# Patient Record
Sex: Male | Born: 1965 | Race: White | Hispanic: No | Marital: Married | Smoking: Never smoker
Health system: Southern US, Community
[De-identification: ages and names within clinical notes are randomized; demographics above are authoritative.]

## PROBLEM LIST (undated history)

## (undated) DIAGNOSIS — K219 Gastro-esophageal reflux disease without esophagitis: Secondary | ICD-10-CM

## (undated) DIAGNOSIS — K449 Diaphragmatic hernia without obstruction or gangrene: Secondary | ICD-10-CM

## (undated) HISTORY — DX: Gastro-esophageal reflux disease without esophagitis: K21.9

---

## 2013-05-23 ENCOUNTER — Emergency Department (HOSPITAL_COMMUNITY): Payer: BC Managed Care – PPO

## 2013-05-23 ENCOUNTER — Emergency Department (HOSPITAL_COMMUNITY)
Admission: EM | Admit: 2013-05-23 | Discharge: 2013-05-24 | Disposition: A | Discharge status: Home / Self Care | Payer: BC Managed Care – PPO | Attending: Emergency Medicine | Admitting: Emergency Medicine

## 2013-05-23 ENCOUNTER — Encounter (HOSPITAL_COMMUNITY): Payer: Self-pay | Admitting: Emergency Medicine

## 2013-05-23 DIAGNOSIS — Z880 Allergy status to penicillin: Secondary | ICD-10-CM | POA: Insufficient documentation

## 2013-05-23 DIAGNOSIS — Z79899 Other long term (current) drug therapy: Secondary | ICD-10-CM | POA: Insufficient documentation

## 2013-05-23 DIAGNOSIS — K219 Gastro-esophageal reflux disease without esophagitis: Secondary | ICD-10-CM | POA: Insufficient documentation

## 2013-05-23 DIAGNOSIS — R072 Precordial pain: Secondary | ICD-10-CM | POA: Insufficient documentation

## 2013-05-23 DIAGNOSIS — R079 Chest pain, unspecified: Secondary | ICD-10-CM

## 2013-05-23 LAB — COMPREHENSIVE METABOLIC PANEL
ALBUMIN: 4 g/dL (ref 3.5–5.2)
ALT: 29 U/L (ref 0–53)
AST: 27 U/L (ref 0–37)
Alkaline Phosphatase: 93 U/L (ref 39–117)
BUN: 27 mg/dL — ABNORMAL HIGH (ref 6–23)
CO2: 23 mEq/L (ref 19–32)
CREATININE: 1.04 mg/dL (ref 0.50–1.35)
Calcium: 9.1 mg/dL (ref 8.4–10.5)
Chloride: 103 mEq/L (ref 96–112)
GFR calc Af Amer: >90 mL/min (ref >90)
GFR calc non Af Amer: 83 mL/min — ABNORMAL LOW (ref >90)
Glucose, Bld: 104 mg/dL — ABNORMAL HIGH (ref 70–99)
POTASSIUM: 3.9 meq/L (ref 3.7–5.3)
Sodium: 140 mEq/L (ref 137–147)
TOTAL PROTEIN: 7.2 g/dL (ref 6.0–8.3)
Total Bilirubin: 0.7 mg/dL (ref 0.3–1.2)

## 2013-05-23 LAB — CBC WITH DIFFERENTIAL/PLATELET
BASOS ABS: 0 10*3/uL (ref 0.0–0.1)
BASOS PCT: 1 % (ref 0–1)
Eosinophils Absolute: 0.1 10*3/uL (ref 0.0–0.7)
Eosinophils Relative: 2 % (ref 0–5)
HCT: 41.3 % (ref 39.0–52.0)
Hemoglobin: 14.6 g/dL (ref 13.0–17.0)
Lymphocytes Relative: 15 % (ref 12–46)
Lymphs Abs: 0.9 10*3/uL (ref 0.7–4.0)
MCH: 29.5 pg (ref 26.0–34.0)
MCHC: 35.4 g/dL (ref 30.0–36.0)
MCV: 83.4 fL (ref 78.0–100.0)
MONO ABS: 0.8 10*3/uL (ref 0.1–1.0)
Monocytes Relative: 13 % — ABNORMAL HIGH (ref 3–12)
NEUTROS ABS: 4 10*3/uL (ref 1.7–7.7)
NEUTROS PCT: 68 % (ref 43–77)
Platelets: 177 10*3/uL (ref 150–400)
RBC: 4.95 MIL/uL (ref 4.22–5.81)
RDW: 13.3 % (ref 11.5–15.5)
WBC: 5.9 10*3/uL (ref 4.0–10.5)

## 2013-05-23 LAB — PRO B NATRIURETIC PEPTIDE

## 2013-05-23 LAB — LIPASE, BLOOD: Lipase: 46 U/L (ref 11–59)

## 2013-05-23 LAB — I-STAT TROPONIN, ED: TROPONIN I, POC: 0.01 ng/mL (ref 0.00–0.08)

## 2013-05-23 MED ORDER — GI COCKTAIL ~~LOC~~
30.0000 mL | Freq: Once | ORAL | Status: AC
  Administered 2013-05-23: 30 mL via ORAL
  Filled 2013-05-23: qty 30

## 2013-05-23 MED ORDER — ONDANSETRON HCL 4 MG/2ML IJ SOLN
4.0000 mg | Freq: Once | INTRAMUSCULAR | Status: AC
  Administered 2013-05-23: 4 mg via INTRAVENOUS
  Filled 2013-05-23: qty 2

## 2013-05-23 NOTE — ED Provider Notes (Signed)
CSN: [id number]     Arrival date & time 05/23/13  2036 History   First MD Initiated Contact with Patient 05/23/13 2050     Chief Complaint  Patient presents with  . Chest Pain     (Consider location/radiation/quality/duration/timing/severity/associated sxs/prior Treatment) HPI Alexander Carroll is a 48 y.o. male who presents to ED with complaint of burping, chest burning, nausea, vomiting, diarrhea. States symptoms began several days ago. States he has increased belching and burping. States every time he burps he has burning sensation and pain to the upper midsternal chest. States also has had nausea and vomiting after eating that started about 2 days ago. Reports watery diarrhea today. Denies blood in emesis or stool. Denies fever or chills. States has seen his PCP, started on pepcid. Has apt again next week to be set up with GI. Pt admits to alcohol use, denies daily use. Denies NSAID use. No abdominal pain. No back pain. Admits to some SOB. No symptoms with exertion. No extremity swelling.   History reviewed. No pertinent past medical history. History reviewed. No pertinent past surgical history. No family history on file. History  Substance Use Topics  . Smoking status: Never Smoker   . Smokeless tobacco: Not on file  . Alcohol Use: Yes     Comment: occ    Review of Systems  Constitutional: Negative for fever and chills.  HENT: Negative for congestion.   Respiratory: Positive for chest tightness and shortness of breath. Negative for cough.   Cardiovascular: Positive for chest pain. Negative for palpitations and leg swelling.  Gastrointestinal: Positive for nausea, vomiting and diarrhea. Negative for abdominal pain and abdominal distention.  Genitourinary: Negative for dysuria, urgency, frequency and hematuria.  Musculoskeletal: Negative for arthralgias, myalgias, neck pain and neck stiffness.  Skin: Negative for rash.  Allergic/Immunologic: Negative for immunocompromised state.   Neurological: Negative for dizziness, weakness, light-headedness, numbness and headaches.  All other systems reviewed and are negative.     Allergies  Penicillins  Home Medications   Prior to Admission medications   Medication Sig Start Date End Date Taking? Authorizing Provider  acetaminophen (TYLENOL) 325 MG tablet Take 650 mg by mouth every 6 (six) hours as needed for mild pain.   Yes Historical Provider, MD  famotidine (PEPCID) 40 MG tablet Take 1 tablet by mouth daily. 04/02/13  Yes Historical Provider, MD  Multiple Vitamin (MULTIVITAMIN WITH MINERALS) TABS tablet Take 1 tablet by mouth daily.   Yes Historical Provider, MD   BP 143/93  Pulse 75  Temp(Src) 98.1 F (36.7 C) (Oral)  Resp 20  Ht 6\' 1"  (1.854 m)  Wt 185 lb (83.915 kg)  BMI 24.41 kg/m2  SpO2 96% Physical Exam  Nursing note and vitals reviewed. Constitutional: He is oriented to person, place, and time. He appears well-developed and well-nourished. No distress.  HENT:  Head: Normocephalic and atraumatic.  Eyes: Conjunctivae are normal.  Neck: Neck supple.  Cardiovascular: Normal rate, regular rhythm and normal heart sounds.   Pulmonary/Chest: Effort normal and breath sounds normal. No respiratory distress. He has no wheezes. He has no rales. He exhibits no tenderness.  Abdominal: Soft. Bowel sounds are normal. He exhibits no distension. There is no tenderness. There is no rebound.  Musculoskeletal: He exhibits no edema.  Neurological: He is alert and oriented to person, place, and time.  Skin: Skin is warm and dry.    ED Course  Procedures (including critical care time) Labs Review Labs Reviewed  CBC WITH DIFFERENTIAL - Abnormal; Notable  for the following:    Monocytes Relative 13 (*)    All other components within normal limits  COMPREHENSIVE METABOLIC PANEL - Abnormal; Notable for the following:    Glucose, Bld 104 (*)    BUN 27 (*)    GFR calc non Af Amer 83 (*)    All other components within  normal limits  LIPASE, BLOOD  PRO B NATRIURETIC PEPTIDE  I-STAT TROPOININ, ED  I-STAT TROPOININ, ED    Imaging Review Dg Chest 2 View  05/23/2013   CLINICAL DATA:  Chest pain  EXAM: CHEST  2 VIEW  COMPARISON:  None.  FINDINGS: Shallow inspiration. Mild cardiac enlargement with mild pulmonary vascular congestion. Interstitial changes could represent early interstitial edema or fibrosis. No focal airspace disease or consolidation in the lungs. No blunting of costophrenic angles. No pneumothorax. Old right rib fractures.  IMPRESSION: Cardiac enlargement with mild pulmonary vascular congestion and early interstitial edema versus fibrosis.   Electronically Signed   By: Burman NievesWilliam  Stevens M.D.   On: 05/23/2013 22:21     EKG Interpretation   Date/Time:  Wednesday May 23 2013 20:46:31 EDT Ventricular Rate:  76 PR Interval:  155 QRS Duration: 101 QT Interval:  397 QTC Calculation: 446 R Axis:   12 Text Interpretation:  Sinus rhythm Abnormal R-wave progression, early  transition No old tracing to compare Confirmed by JACUBOWITZ  MD, SAM  [phone]) on 05/23/2013 8:53:25 PM      MDM   Final diagnoses:  Chest pain  GERD (gastroesophageal reflux disease)    Pt with burping, belching, chest burning, nausea, vomiting diarrhea. Pt's symptoms atypical for cardiac pain. ECG normal. Will get two troponins, labs, cxr. Will try zofran and gi cocktail. Will monitor.    11:59 PM Pt's symptoms improved with zofran and gi cocktail. CXR showed cardiac enlargement with mild pulmonary vascular congestion and early interstitial edema vs fibrosis. Added BNP. Will monitor, recheck trop. Pt discussed and also seen by Dr. Ethelda ChickJacubowitz, agrees with the plan.    Recheck trop negative. BNP negative. Home with prilosec, carafate, continue pepcid. Follow up with pcp.    Filed Vitals:   05/23/13 2048 05/23/13 2200 05/24/13 0009  BP: 143/93 128/84 121/81  Pulse: 75 68 72  Temp: 98.1 F (36.7 C)  98.3 F (36.8 C)   TempSrc: Oral  Oral  Resp: 20 17 18   Height: 6\' 1"  (1.854 m)    Weight: 185 lb (83.915 kg)    SpO2: 96% 95% 94%      Lottie Musselatyana A Mohmmad Saleeby, PA-C 05/24/13 0116

## 2013-05-23 NOTE — ED Notes (Signed)
Pt states that he has ingestion, nausea and vomiting over the last 3-4 days; pt states that he has had midsternal chest pain / pressure x 3-4 days; pt states that the pressure to chest feels better after vomiting; pt also c/o diarrhea x 2 today.

## 2013-05-23 NOTE — ED Provider Notes (Signed)
Patient reports that he vomits approximately 30 minutes after eating for the past 2 weeks and belches loudly. He has anterior chest pain only with belching lasting a few seconds. Pain is Nonexertional.. Pain is highly atypical for cardiac etiology. Likely functional.  Doug SouSam Kristina Bertone, MD 05/23/13 [phone]

## 2013-05-24 LAB — I-STAT TROPONIN, ED: Troponin i, poc: 0 ng/mL (ref 0.00–0.08)

## 2013-05-24 MED ORDER — OMEPRAZOLE 20 MG PO CPDR: 20.0000 mg | DELAYED_RELEASE_CAPSULE | Freq: Every day | ORAL | Status: DC

## 2013-05-24 MED ORDER — SUCRALFATE 1 G PO TABS: 1.0000 g | ORAL_TABLET | Freq: Three times a day (TID) | ORAL | Status: DC

## 2013-05-24 NOTE — Discharge Instructions (Signed)
Your work up here is normal. Continue to take pepcid. Take prilosec in addition for acid relief. Take carafate for symptoms as needed. Follow up with primary car doctor for recheck or with gastroenterology.    Diet for Gastroesophageal Reflux Disease, Adult Reflux is when stomach acid flows up into the esophagus. The esophagus becomes irritated and sore (inflammation). When reflux happens often and is severe, it is called gastroesophageal reflux disease (GERD). What you eat can help ease any discomfort caused by GERD. FOODS OR DRINKS TO AVOID OR LIMIT  Coffee and black tea, with or without caffeine.  Bubbly (carbonated) drinks with caffeine or energy drinks.  Strong spices, such as pepper, cayenne pepper, curry, or chili powder.  Peppermint or spearmint.  Chocolate.  High-fat foods, such as meats, fried food, oils, butter, or nuts.  Fruits and vegetables that cause discomfort. This includes citrus fruits and tomatoes.  Alcohol. If a certain food or drink irritates your GERD, avoid eating or drinking it. THINGS THAT MAY HELP GERD INCLUDE:  Eat meals slowly.  Eat 5 to 6 small meals a day, not 3 large meals.  Do not eat food for a certain amount of time if it causes discomfort.  Wait 3 hours after eating before lying down.  Keep the head of your bed raised 6 to 9 inches (15 23 centimeters). Put a foam wedge or blocks under the legs of the bed.  Stay active. Weight loss, if needed, may help ease your discomfort.  Wear loose-fitting clothing.  Do not smoke or chew tobacco. Document Released: 06/29/2011 Document Reviewed: 06/29/2011 [facility] Patient Information 2014 [employer] [place], [place].

## 2013-05-24 NOTE — ED Provider Notes (Signed)
Medical screening examination/treatment/procedure(s) were performed by non-physician practitioner and as supervising physician I was immediately available for consultation/collaboration.   EKG Interpretation   Date/Time:  Wednesday May 23 2013 20:46:31 EDT Ventricular Rate:  76 PR Interval:  155 QRS Duration: 101 QT Interval:  397 QTC Calculation: 446 R Axis:   12 Text Interpretation:  Sinus rhythm Abnormal R-wave progression, early  transition No old tracing to compare Confirmed by Ethelda ChickJACUBOWITZ  MD, SAM  [phone]) on 05/23/2013 8:53:25 PM        Lyanne CoKevin M Basilia Stuckert, MD 05/24/13 [phone]

## 2014-02-08 ENCOUNTER — Encounter (HOSPITAL_COMMUNITY): Payer: Self-pay

## 2014-02-08 ENCOUNTER — Emergency Department (HOSPITAL_COMMUNITY)
Admission: EM | Admit: 2014-02-08 | Discharge: 2014-02-08 | Disposition: A | Discharge status: Home / Self Care | Payer: BLUE CROSS/BLUE SHIELD | Attending: Emergency Medicine | Admitting: Emergency Medicine

## 2014-02-08 ENCOUNTER — Emergency Department (HOSPITAL_COMMUNITY): Payer: BLUE CROSS/BLUE SHIELD

## 2014-02-08 DIAGNOSIS — Z791 Long term (current) use of non-steroidal anti-inflammatories (NSAID): Secondary | ICD-10-CM | POA: Diagnosis not present

## 2014-02-08 DIAGNOSIS — Y9389 Activity, other specified: Secondary | ICD-10-CM | POA: Insufficient documentation

## 2014-02-08 DIAGNOSIS — Y998 Other external cause status: Secondary | ICD-10-CM | POA: Diagnosis not present

## 2014-02-08 DIAGNOSIS — S63502A Unspecified sprain of left wrist, initial encounter: Secondary | ICD-10-CM | POA: Diagnosis not present

## 2014-02-08 DIAGNOSIS — Y9289 Other specified places as the place of occurrence of the external cause: Secondary | ICD-10-CM | POA: Insufficient documentation

## 2014-02-08 DIAGNOSIS — Z79899 Other long term (current) drug therapy: Secondary | ICD-10-CM | POA: Diagnosis not present

## 2014-02-08 DIAGNOSIS — Z880 Allergy status to penicillin: Secondary | ICD-10-CM | POA: Insufficient documentation

## 2014-02-08 DIAGNOSIS — X58XXXA Exposure to other specified factors, initial encounter: Secondary | ICD-10-CM | POA: Diagnosis not present

## 2014-02-08 DIAGNOSIS — M25532 Pain in left wrist: Secondary | ICD-10-CM | POA: Diagnosis present

## 2014-02-08 HISTORY — DX: Diaphragmatic hernia without obstruction or gangrene: K44.9

## 2014-02-08 MED ORDER — IBUPROFEN 800 MG PO TABS
800.0000 mg | ORAL_TABLET | Freq: Once | ORAL | Status: AC
  Administered 2014-02-08: 800 mg via ORAL
  Filled 2014-02-08: qty 1

## 2014-02-08 MED ORDER — IBUPROFEN 800 MG PO TABS: 800.0000 mg | ORAL_TABLET | Freq: Three times a day (TID) | ORAL | Status: DC

## 2014-02-08 NOTE — Discharge Instructions (Signed)
Joint Sprain A sprain is a tear or stretch in the ligaments that hold a joint together. Severe sprains may need as long as 3-6 weeks of immobilization and/or exercises to heal completely. Sprained joints should be rested and protected. If not, they can become unstable and prone to re-injury. Proper treatment can reduce your pain, shorten the period of disability, and reduce the risk of repeated injuries. TREATMENT   Rest and elevate the injured joint to reduce pain and swelling.  Apply ice packs to the injury for 20-30 minutes every 2-3 hours for the next 2-3 days.  Keep the injury wrapped in a compression bandage or splint as long as the joint is painful or as instructed by your caregiver.  Do not use the injured joint until it is completely healed to prevent re-injury and chronic instability. Follow the instructions of your caregiver.  Long-term sprain management may require exercises and/or treatment by a physical therapist. Taping or special braces may help stabilize the joint until it is completely better. SEEK MEDICAL CARE IF:   You develop increased pain or swelling of the joint.  You develop increasing redness and warmth of the joint.  You develop a fever.  It becomes stiff.  Your hand or foot gets cold or numb. Document Released: 02/05/2004 Document Revised: 03/22/2011 Document Reviewed: 01/15/2008 [facility] Patient Information 2015 [place], [place]. This information is not intended to replace advice given to you by your health care provider. Make sure you discuss any questions you have with your health care provider.   Please call your doctor for a followup appointment within 24-48 hours. When you talk to your doctor please let them know that you were seen in the emergency department and have them acquire all of your records so that they can discuss the findings with you and formulate a treatment plan to fully care for your new and ongoing problems.

## 2014-02-08 NOTE — ED Notes (Signed)
Requested large left cock-up splint from materials. None are currently stocked in the department.

## 2014-02-08 NOTE — ED Provider Notes (Signed)
CSN: [id number]     Arrival date & time 02/08/14  0700 History  . This patient was seen in room APA19/APA19 and the patient's care was started at 7:58 AM.    Chief Complaint  Patient presents with  . 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 This has been persistent, mild to moderate, worse with range of motion, not associated with redness fevers or numbness. No medications given prior to arrival, no history of traumatic injury to the wrist.  The history is provided by the patient. No language interpreter was used.    HPI Comments: Margaretha SeedsSamuel Offner is a 49 y.o. male who presents to the Emergency Department complaining of wrist pain is as documented in [place]   Past Medical History  Diagnosis Date  . Hiatal hernia    History reviewed. No pertinent past surgical history. No family history on file. History  Substance Use Topics  . Smoking status: Never Smoker   . Smokeless tobacco: Not on file  . Alcohol Use: No     Comment: occ    Review of Systems  Gastrointestinal: Negative for nausea and vomiting.  Musculoskeletal: Positive for joint swelling (L wrist). Negative for back pain and neck pain.  Neurological: Negative for weakness and numbness.      Allergies  Penicillins  Home Medications   Prior to Admission medications   Medication Sig Start Date End Date Taking? Authorizing Provider  acetaminophen (TYLENOL) 325 MG tablet Take 650 mg by mouth every 6 (six) hours as needed for mild pain.    Historical Provider, MD  famotidine (PEPCID) 40 MG tablet Take 1 tablet by mouth daily. 04/02/13   Historical Provider, MD  ibuprofen (ADVIL,MOTRIN) 800 MG tablet Take 1 tablet (800 mg total) by mouth 3 (three) times daily. 02/08/14   Vida RollerBrian D Shannara Winbush, MD  Multiple Vitamin (MULTIVITAMIN WITH MINERALS) TABS tablet Take 1 tablet by mouth daily.    Historical  Provider, MD  omeprazole (PRILOSEC) 20 MG capsule Take 1 capsule (20 mg total) by mouth daily. 05/24/13   Tatyana A Kirichenko, PA-C  sucralfate (CARAFATE) 1 G tablet Take 1 tablet (1 g total) by mouth 4 (four) times daily -  with meals and at bedtime. 05/24/13   Tatyana A Kirichenko, PA-C   BP 156/96 mmHg  Pulse 73  Temp(Src) 97.9 F (36.6 C) (Oral)  Resp 18  Ht 6\' 2"  (1.88 m)  Wt 205 lb (92.987 kg)  BMI 26.31 kg/m2  SpO2 100% Physical Exam  Constitutional: He appears well-developed and well-nourished. No distress.  HENT:  Head: Normocephalic and atraumatic.  Eyes: Conjunctivae are normal. Right eye exhibits no discharge. Left eye exhibits no discharge. No scleral icterus.  Cardiovascular: Normal rate, regular rhythm and intact distal pulses.   Pulmonary/Chest: Effort normal and breath sounds normal. No respiratory distress.  Musculoskeletal: He exhibits tenderness ( ttp over teh L wrsist, mild diffuse swelling, of wrist, focal pain over dorsum,  pain with forced extension and forced flexion - non focal.). He exhibits no edema.  Neurological: He is alert. Coordination normal.  Skin: Skin is warm and dry. No rash noted. He is not diaphoretic. No erythema.  Psychiatric: He has a normal mood and affect.  Nursing note and vitals reviewed.   ED Course  Procedures (including critical care time) DIAGNOSTIC STUDIES: Oxygen Saturation is 100% on RA, normal by my  interpretation.    COORDINATION OF CARE: 7:58 AM Discussed treatment plan with pt at bedside and pt agreed to plan.    Labs Review Labs Reviewed - No data to display  Imaging Review Dg Wrist Complete Left  02/08/2014   CLINICAL DATA:  Pain and swelling in the left wrist, no known injury, initial encounter  EXAM: LEFT WRIST - COMPLETE 3+ VIEW  COMPARISON:  None.  FINDINGS: There is no evidence of fracture or dislocation. There is no evidence of arthropathy or other focal bone abnormality. Soft tissues are unremarkable.   IMPRESSION: No acute abnormality noted.   Electronically Signed   By: Alcide Clever M.D.   On: 02/08/2014 07:47   Dg Hand Complete Left  02/08/2014   CLINICAL DATA:  Swelling and pain across metacarpals prominent injury, initial encounter  EXAM: LEFT HAND - COMPLETE 3+ VIEW  COMPARISON:  None.  FINDINGS: There is no evidence of fracture or dislocation. There is no evidence of arthropathy or other focal bone abnormality. Soft tissues are unremarkable.  IMPRESSION: No acute abnormality noted.   Electronically Signed   By: Alcide Clever M.D.   On: 02/08/2014 07:46      MDM   Final diagnoses:  Sprain of left wrist, initial encounter     no signs of flexor or extensor tenosynovitis, likely sprain of the left wrist as the patient does repetitive lifting at work, wrist immobilization, [facility] therapy, job modification and follow up with orthopedics, patient states that he has an orthopedist that he can follow-up with in [place].   Meds given in ED:  Medications  ibuprofen (ADVIL,MOTRIN) tablet 800 mg (not administered)    New Prescriptions   IBUPROFEN (ADVIL,MOTRIN) 800 MG TABLET    Take 1 tablet (800 mg total) by mouth 3 (three) times daily.       Vida Roller, MD 02/08/14 [phone]

## 2014-02-08 NOTE — ED Notes (Signed)
Pt reports works 3rd shift and started having pain and swelling in left wrist and hand.  Denies injury.

## 2015-10-27 ENCOUNTER — Emergency Department (HOSPITAL_BASED_OUTPATIENT_CLINIC_OR_DEPARTMENT_OTHER): Payer: BLUE CROSS/BLUE SHIELD

## 2015-10-27 ENCOUNTER — Emergency Department (HOSPITAL_BASED_OUTPATIENT_CLINIC_OR_DEPARTMENT_OTHER)
Admission: EM | Admit: 2015-10-27 | Discharge: 2015-10-27 | Disposition: A | Discharge status: Home / Self Care | Payer: BLUE CROSS/BLUE SHIELD | Attending: Emergency Medicine | Admitting: Emergency Medicine

## 2015-10-27 ENCOUNTER — Encounter (HOSPITAL_BASED_OUTPATIENT_CLINIC_OR_DEPARTMENT_OTHER): Payer: Self-pay | Admitting: *Deleted

## 2015-10-27 DIAGNOSIS — J208 Acute bronchitis due to other specified organisms: Secondary | ICD-10-CM

## 2015-10-27 DIAGNOSIS — J40 Bronchitis, not specified as acute or chronic: Secondary | ICD-10-CM | POA: Insufficient documentation

## 2015-10-27 DIAGNOSIS — R05 Cough: Secondary | ICD-10-CM | POA: Diagnosis present

## 2015-10-27 MED ORDER — HYDROCOD POLST-CPM POLST ER 10-8 MG/5ML PO SUER
5.0000 mL | Freq: Once | ORAL | Status: AC
  Administered 2015-10-27: 5 mL via ORAL
  Filled 2015-10-27: qty 5

## 2015-10-27 MED ORDER — ALBUTEROL SULFATE HFA 108 (90 BASE) MCG/ACT IN AERS
6.0000 | INHALATION_SPRAY | Freq: Once | RESPIRATORY_TRACT | Status: AC
  Administered 2015-10-27: 6 via RESPIRATORY_TRACT
  Filled 2015-10-27: qty 6.7

## 2015-10-27 MED ORDER — HYDROCOD POLST-CPM POLST ER 10-8 MG/5ML PO SUER: 5.0000 mL | Freq: Two times a day (BID) | ORAL | 0 refills | Status: DC | PRN

## 2015-10-27 MED ORDER — IPRATROPIUM-ALBUTEROL 0.5-2.5 (3) MG/3ML IN SOLN
3.0000 mL | Freq: Four times a day (QID) | RESPIRATORY_TRACT | Status: DC
  Administered 2015-10-27: 3 mL via RESPIRATORY_TRACT
  Filled 2015-10-27: qty 3

## 2015-10-27 MED ORDER — DEXAMETHASONE SODIUM PHOSPHATE 10 MG/ML IJ SOLN
10.0000 mg | Freq: Once | INTRAMUSCULAR | Status: AC
  Administered 2015-10-27: 10 mg via INTRAVENOUS
  Filled 2015-10-27: qty 1

## 2015-10-27 MED ORDER — ALBUTEROL SULFATE (2.5 MG/3ML) 0.083% IN NEBU
INHALATION_SOLUTION | RESPIRATORY_TRACT | Status: AC
  Administered 2015-10-27: 5 mg
  Filled 2015-10-27: qty 6

## 2015-10-27 MED FILL — HYDROCODONE-CHLORPHENIRAM S: 10-8 | 14 days supply | Qty: 140 | Fill #0

## 2015-10-27 NOTE — Discharge Instructions (Signed)
Drink plenty of fluids and get plenty of rest. This is a viral process and needs time to get better.  Return without fail for worsening symptoms, including new fevers, difficulty breathing, worsening pain, or any other symptoms concerning to you.

## 2015-10-27 NOTE — ED Provider Notes (Signed)
[facility] Provider Note   CSN: [id number] Arrival date & time: 10/27/15  1410  By signing my name below, I, Soijett Blue, attest that this documentation has been prepared under the direction and in the presence of Lavera Guiseana Duo Waylynn Benefiel, MD. Electronically Signed: Soijett Blue, ED Scribe. 10/27/15. 3:16 PM.   History   Chief Complaint Chief Complaint  Patient presents with  . URI    HPI Alexander Carroll is a 50 y.o. male who presents to the Emergency Department complaining of cough onset 1 week. Pt denies sick contacts. 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 He states that he is having associated symptoms of nasal congestion. He states that he has tried Nyquil for the relief of his symptoms. He denies fever, chills, nausea, vomiting, rhinorrhea, sore throat, diarrhea, difficulty urinating, and any other symptoms. Pt denies PMHx of asthma or COPD, and states that he is otherwise healthy. Pt denies recent travel. No leg swelling, leg pain.  The history is provided by the patient and the spouse. No language interpreter was used.    Past Medical History:  Diagnosis Date  . Hiatal hernia     There are no active problems to display for this patient.   History reviewed. No pertinent surgical history.     Home Medications    Prior to Admission medications   Medication Sig Start Date End Date Taking? Authorizing Provider  acetaminophen (TYLENOL) 325 MG tablet Take 650 mg by mouth every 6 (six) hours as needed for mild pain.    Historical Provider, MD  chlorpheniramine-HYDROcodone (TUSSIONEX PENNKINETIC ER) 10-8 MG/5ML SUER Take 5 mLs by mouth every 12 (twelve) hours as needed for cough. 10/27/15   Lavera Guiseana Duo Dayson Aboud, MD  famotidine (PEPCID) 40 MG tablet Take 1 tablet by mouth daily. 04/02/13   Historical Provider, MD  ibuprofen (ADVIL,MOTRIN) 800 MG tablet Take 1 tablet (800 mg total) by mouth 3 (three) times daily. 02/08/14   Eber HongBrian Miller, MD  Multiple  Vitamin (MULTIVITAMIN WITH MINERALS) TABS tablet Take 1 tablet by mouth daily.    Historical Provider, MD  omeprazole (PRILOSEC) 20 MG capsule Take 1 capsule (20 mg total) by mouth daily. 05/24/13   Tatyana Kirichenko, PA-C  sucralfate (CARAFATE) 1 G tablet Take 1 tablet (1 g total) by mouth 4 (four) times daily -  with meals and at bedtime. 05/24/13   Jaynie Crumbleatyana Kirichenko, PA-C    Family History History reviewed. No pertinent family history.  Social History Social History  Substance Use Topics  . Smoking status: Never Smoker  . Smokeless tobacco: Not on file  . Alcohol use No     Comment: occ     Allergies   Penicillins   Review of Systems Review of Systems 10/14 systems reviewed and are negative other than those stated in the HPI  Physical Exam Updated Vital Signs BP 146/93   Pulse 100   Temp 98.1 F (36.7 C)   Resp 16   Ht 6\' 1"  (1.854 m)   Wt 215 lb (97.5 kg)   SpO2 95%   BMI 28.37 kg/m   Physical Exam Physical Exam  Nursing note and vitals reviewed. Constitutional: non-toxic, and in no acute distress Head: Normocephalic and atraumatic.  Mouth/Throat: Oropharynx is clear and dry mucus membranes.  Neck: Normal range of motion. Neck supple.  Cardiovascular: Tachycardic rate and regular rhythm.   Pulmonary/Chest: Effort normal and breath sounds normal. Bronchial spastic cough with deep inspiration.  Abdominal: Soft.  There is no tenderness. There is no rebound and no guarding.  Musculoskeletal: Normal range of motion.  Neurological: Alert, no facial droop, fluent speech, moves all extremities symmetrically Skin: Skin is warm and dry.  Psychiatric: Cooperative   ED Treatments / Results  DIAGNOSTIC STUDIES: Oxygen Saturation is 97% on RA, nl by my interpretation.    COORDINATION OF CARE: 3:14 PM Discussed treatment plan with pt at bedside which includes CXR, breathing treatment, tussionex, and pt agreed to plan.   Labs (all labs ordered are listed, but only  abnormal results are displayed) Labs Reviewed - No data to display  EKG  EKG Interpretation  Date/Time:  Monday October 27 2015 16:06:53 EDT Ventricular Rate:  108 PR Interval:    QRS Duration: 91 QT Interval:  337 QTC Calculation: 452 R Axis:   -10 Text Interpretation:  Sinus tachycardia Minimal ST depression, lateral leads similar to prior EKG  Confirmed by Lynnsey Barbara MD, Yashua Bracco [phone]) on 10/27/2015 4:39:41 PM       Radiology Dg Chest 2 View  Result Date: 10/27/2015 CLINICAL DATA:  Patient with cough and nasal drainage for 1 week. EXAM: CHEST  2 VIEW COMPARISON:  Chest radiograph 05/23/2013. FINDINGS: Stable cardiac and mediastinal contours. No consolidative pulmonary opacities. No pleural effusion or pneumothorax. Thoracic spine degenerative changes. IMPRESSION: No active cardiopulmonary disease. Electronically Signed   By: Annia Belt M.D.   On: 10/27/2015 14:37    Procedures Procedures (including critical care time)  Medications Ordered in ED Medications  ipratropium-albuterol (DUONEB) 0.5-2.5 (3) MG/3ML nebulizer solution 3 mL (3 mLs Nebulization Given 10/27/15 1521)  albuterol (PROVENTIL) (2.5 MG/3ML) 0.083% nebulizer solution (5 mg  Given 10/27/15 1440)  chlorpheniramine-HYDROcodone (TUSSIONEX) 10-8 MG/5ML suspension 5 mL (5 mLs Oral Given 10/27/15 1525)  albuterol (PROVENTIL HFA;VENTOLIN HFA) 108 (90 Base) MCG/ACT inhaler 6 puff (6 puffs Inhalation Given 10/27/15 1610)  dexamethasone (DECADRON) injection 10 mg (10 mg Intravenous Given 10/27/15 1617)     Initial Impression / Assessment and Plan / ED Course  I have reviewed the triage vital signs and the nursing notes.  Pertinent labs & imaging results that were available during my care of the patient were reviewed by me and considered in my medical decision making (see chart for details).  Clinical Course    50 year old male, otherwise healthy, who presents with cough and URI symptoms. He is nontoxic in no acute  distress, but has persistent bronchospastic cough. Lungs are clear. Afebrile and hemodynamically stable. Chest x-ray without infiltrate, edema, or other acute cardiopulmonary processes. Suspect that this is a viral bronchitis presentation. Given albuterol inhaler, cough medication and discussed supportive care instructions for home. Strict return follow-up instructions are reviewed. He expressed understanding of all discharge instructions, and felt comfortable with the plan of care.  Final Clinical Impressions(s) / ED Diagnoses   Final diagnoses:  Viral bronchitis    New Prescriptions New Prescriptions   CHLORPHENIRAMINE-HYDROCODONE (TUSSIONEX PENNKINETIC ER) 10-8 MG/5ML SUER    Take 5 mLs by mouth every 12 (twelve) hours as needed for cough.    I personally performed the services described in this documentation, which was scribed in my presence. The recorded information has been reviewed and is accurate.     Lavera Guise, MD 10/27/15 [phone]

## 2015-10-27 NOTE — ED Notes (Signed)
Patient transported to X-ray 

## 2015-10-27 NOTE — ED Triage Notes (Signed)
pt c/o URi  Symptoms  x 1 week

## 2015-10-27 NOTE — ED Notes (Signed)
Ginger ale given per pt request. 

## 2016-01-12 HISTORY — PX: COLONOSCOPY: SHX174

## 2016-01-22 ENCOUNTER — Other Ambulatory Visit (INDEPENDENT_AMBULATORY_CARE_PROVIDER_SITE_OTHER): Payer: Self-pay | Admitting: Specialist

## 2016-01-22 DIAGNOSIS — M25522 Pain in left elbow: Secondary | ICD-10-CM

## 2016-01-22 NOTE — Progress Notes (Unsigned)
In the office with his wife Doreene BurkeSharon Hicks for a pre operative evaluation, for her surgery. He has had left elbow pain with associated catch and locking. He demonstrates bone crepitus in the office, I ordered a CT Scan unenhaced of the left elbo wot assess for a loose body.Will call with result of the study and decide on whether an arthroscopy is needed.

## 2016-01-22 NOTE — Progress Notes (Signed)
This is for FYI- for pre-auth of scan

## 2016-01-26 NOTE — Progress Notes (Signed)
Noted and Berkley Harveyauth has been done

## 2016-01-28 ENCOUNTER — Other Ambulatory Visit: Payer: BLUE CROSS/BLUE SHIELD

## 2016-01-30 ENCOUNTER — Ambulatory Visit
Admission: RE | Admit: 2016-01-30 | Discharge: 2016-01-30 | Disposition: A | Discharge status: Home / Self Care | Payer: BLUE CROSS/BLUE SHIELD | Source: Ambulatory Visit | Attending: Specialist | Admitting: Specialist

## 2016-01-30 DIAGNOSIS — M25522 Pain in left elbow: Secondary | ICD-10-CM

## 2016-07-28 ENCOUNTER — Encounter (HOSPITAL_COMMUNITY): Payer: Self-pay | Admitting: *Deleted

## 2016-07-28 ENCOUNTER — Observation Stay (HOSPITAL_COMMUNITY)
Admission: EM | Admit: 2016-07-28 | Discharge: 2016-07-29 | Disposition: A | Discharge status: Home / Self Care | Payer: BLUE CROSS/BLUE SHIELD | Attending: Internal Medicine | Admitting: Internal Medicine

## 2016-07-28 ENCOUNTER — Observation Stay (HOSPITAL_BASED_OUTPATIENT_CLINIC_OR_DEPARTMENT_OTHER): Payer: BLUE CROSS/BLUE SHIELD

## 2016-07-28 ENCOUNTER — Emergency Department (HOSPITAL_COMMUNITY): Payer: BLUE CROSS/BLUE SHIELD

## 2016-07-28 DIAGNOSIS — R0789 Other chest pain: Principal | ICD-10-CM | POA: Insufficient documentation

## 2016-07-28 DIAGNOSIS — R112 Nausea with vomiting, unspecified: Secondary | ICD-10-CM

## 2016-07-28 DIAGNOSIS — R05 Cough: Secondary | ICD-10-CM | POA: Diagnosis not present

## 2016-07-28 DIAGNOSIS — Z79899 Other long term (current) drug therapy: Secondary | ICD-10-CM | POA: Diagnosis not present

## 2016-07-28 DIAGNOSIS — R079 Chest pain, unspecified: Secondary | ICD-10-CM

## 2016-07-28 DIAGNOSIS — G43A0 Cyclical vomiting, not intractable: Secondary | ICD-10-CM

## 2016-07-28 LAB — CBC WITH DIFFERENTIAL/PLATELET
BASOS ABS: 0 10*3/uL (ref 0.0–0.1)
BASOS PCT: 0 %
Eosinophils Absolute: 0.1 10*3/uL (ref 0.0–0.7)
Eosinophils Relative: 2 %
HEMATOCRIT: 43.7 % (ref 39.0–52.0)
HEMOGLOBIN: 15.7 g/dL (ref 13.0–17.0)
Lymphocytes Relative: 12 %
Lymphs Abs: 0.7 10*3/uL (ref 0.7–4.0)
MCH: 30.1 pg (ref 26.0–34.0)
MCHC: 35.9 g/dL (ref 30.0–36.0)
MCV: 83.7 fL (ref 78.0–100.0)
Monocytes Absolute: 0.5 10*3/uL (ref 0.1–1.0)
Monocytes Relative: 9 %
NEUTROS ABS: 4.6 10*3/uL (ref 1.7–7.7)
NEUTROS PCT: 77 %
Platelets: 154 10*3/uL (ref 150–400)
RBC: 5.22 MIL/uL (ref 4.22–5.81)
RDW: 12.8 % (ref 11.5–15.5)
WBC: 6 10*3/uL (ref 4.0–10.5)

## 2016-07-28 LAB — COMPREHENSIVE METABOLIC PANEL
ALBUMIN: 4.2 g/dL (ref 3.5–5.0)
ALK PHOS: 73 U/L (ref 38–126)
ALT: 31 U/L (ref 17–63)
AST: 25 U/L (ref 15–41)
Anion gap: 8 (ref 5–15)
BILIRUBIN TOTAL: 1.1 mg/dL (ref 0.3–1.2)
BUN: 16 mg/dL (ref 6–20)
CALCIUM: 9 mg/dL (ref 8.9–10.3)
CO2: 24 mmol/L (ref 22–32)
Chloride: 105 mmol/L (ref 101–111)
Creatinine, Ser: 0.95 mg/dL (ref 0.61–1.24)
GFR calc Af Amer: >60 mL/min (ref >60)
GFR calc non Af Amer: >60 mL/min (ref >60)
GLUCOSE: 107 mg/dL — AB (ref 65–99)
Potassium: 3.8 mmol/L (ref 3.5–5.1)
Sodium: 137 mmol/L (ref 135–145)
TOTAL PROTEIN: 7.2 g/dL (ref 6.5–8.1)

## 2016-07-28 LAB — URINALYSIS, COMPLETE (UACMP) WITH MICROSCOPIC
Bacteria, UA: NONE SEEN
Bilirubin Urine: NEGATIVE
GLUCOSE, UA: NEGATIVE mg/dL
HGB URINE DIPSTICK: NEGATIVE
KETONES UR: NEGATIVE mg/dL
Leukocytes, UA: NEGATIVE
NITRITE: NEGATIVE
PROTEIN: NEGATIVE mg/dL
Specific Gravity, Urine: 1.016 (ref 1.005–1.030)
WBC UA: NONE SEEN WBC/hpf (ref 0–5)
pH: 6 (ref 5.0–8.0)

## 2016-07-28 LAB — RAPID URINE DRUG SCREEN, HOSP PERFORMED
Amphetamines: NOT DETECTED
Barbiturates: NOT DETECTED
Benzodiazepines: NOT DETECTED
Cocaine: NOT DETECTED
Opiates: POSITIVE — AB
Tetrahydrocannabinol: NOT DETECTED

## 2016-07-28 LAB — LIPASE, BLOOD
Lipase: 35 U/L (ref 11–51)
Lipase: 28 U/L (ref 11–51)

## 2016-07-28 LAB — TROPONIN I

## 2016-07-28 LAB — ECHOCARDIOGRAM COMPLETE
HEIGHTINCHES: 74 in
Weight: 3920 oz

## 2016-07-28 MED ORDER — ADULT MULTIVITAMIN W/MINERALS CH
1.0000 | ORAL_TABLET | Freq: Every day | ORAL | Status: DC
  Administered 2016-07-28 – 2016-07-29 (×2): 1 via ORAL
  Filled 2016-07-28 (×2): qty 1

## 2016-07-28 MED ORDER — MORPHINE SULFATE (PF) 4 MG/ML IV SOLN
4.0000 mg | INTRAVENOUS | Status: DC | PRN
  Administered 2016-07-28: 4 mg via INTRAVENOUS
  Filled 2016-07-28 (×2): qty 1

## 2016-07-28 MED ORDER — ONDANSETRON HCL 4 MG/2ML IJ SOLN: 4.0000 mg | Freq: Four times a day (QID) | INTRAMUSCULAR | Status: DC | PRN

## 2016-07-28 MED ORDER — GI COCKTAIL ~~LOC~~
30.0000 mL | Freq: Once | ORAL | Status: AC
  Administered 2016-07-28: 30 mL via ORAL
  Filled 2016-07-28: qty 30

## 2016-07-28 MED ORDER — ASPIRIN 81 MG PO CHEW
324.0000 mg | CHEWABLE_TABLET | Freq: Once | ORAL | Status: AC
  Administered 2016-07-28: 324 mg via ORAL
  Filled 2016-07-28: qty 4

## 2016-07-28 MED ORDER — ACETAMINOPHEN 325 MG PO TABS
650.0000 mg | ORAL_TABLET | Freq: Four times a day (QID) | ORAL | Status: DC | PRN
  Administered 2016-07-28: 650 mg via ORAL
  Filled 2016-07-28: qty 2

## 2016-07-28 MED ORDER — NITROGLYCERIN 0.4 MG SL SUBL
0.4000 mg | SUBLINGUAL_TABLET | SUBLINGUAL | Status: DC | PRN
  Administered 2016-07-28 (×2): 0.4 mg via SUBLINGUAL
  Filled 2016-07-28: qty 1

## 2016-07-28 MED ORDER — ENOXAPARIN SODIUM 60 MG/0.6ML ~~LOC~~ SOLN
50.0000 mg | SUBCUTANEOUS | Status: DC
  Administered 2016-07-29: 50 mg via SUBCUTANEOUS
  Filled 2016-07-28: qty 0.6

## 2016-07-28 MED ORDER — ACETAMINOPHEN 650 MG RE SUPP: 650.0000 mg | Freq: Four times a day (QID) | RECTAL | Status: DC | PRN

## 2016-07-28 MED ORDER — PANTOPRAZOLE SODIUM 40 MG PO TBEC
40.0000 mg | DELAYED_RELEASE_TABLET | Freq: Every day | ORAL | Status: DC
  Administered 2016-07-28 – 2016-07-29 (×2): 40 mg via ORAL
  Filled 2016-07-28 (×2): qty 1

## 2016-07-28 MED ORDER — ASPIRIN EC 325 MG PO TBEC
325.0000 mg | DELAYED_RELEASE_TABLET | Freq: Every day | ORAL | Status: DC
  Administered 2016-07-29: 325 mg via ORAL
  Filled 2016-07-28: qty 1

## 2016-07-28 MED ORDER — ONDANSETRON HCL 4 MG PO TABS: 4.0000 mg | ORAL_TABLET | Freq: Four times a day (QID) | ORAL | Status: DC | PRN

## 2016-07-28 MED ORDER — GUAIFENESIN-DM 100-10 MG/5ML PO SYRP
15.0000 mL | ORAL_SOLUTION | ORAL | Status: DC | PRN
  Administered 2016-07-28 – 2016-07-29 (×3): 15 mL via ORAL
  Filled 2016-07-28 (×3): qty 15

## 2016-07-28 NOTE — ED Provider Notes (Signed)
[facility]ENCY DEPT Provider Note   CSN: [id number] Arrival date & time: 07/28/16  0944     History   Chief Complaint Chief Complaint  Patient presents with  . Chest Pain    HPI Alexander Carroll is a 51 y.o. male.  HPI  Pt was seen at 1000. Per pt and his wife, c/o gradual onset and persistence of constant mid-sternal chest "pain" that woke him up from sleep at approximately 0800 PTA. Pt describes the CP as "tightness," has been associated with nausea and SOB. Denies vomiting/diarrhea, no abd pain, no back pain, no palpitations, no cough, no fevers, no rash.    Past Medical History:  Diagnosis Date  . Hiatal hernia   . Hiatal hernia     There are no active problems to display for this patient.   History reviewed. No pertinent surgical history.     Home Medications    Prior to Admission medications   Medication Sig Start Date End Date Taking? Authorizing Provider  acetaminophen (TYLENOL) 325 MG tablet Take 650 mg by mouth every 6 (six) hours as needed for mild pain.    [provider]  chlorpheniramine-HYDROcodone (TUSSIONEX PENNKINETIC ER) 10-8 MG/5ML SUER Take 5 mLs by mouth every 12 (twelve) hours as needed for cough. 10/27/15   Lavera Guise, MD  famotidine (PEPCID) 40 MG tablet Take 1 tablet by mouth daily. 04/02/13   [provider]  ibuprofen (ADVIL,MOTRIN) 800 MG tablet Take 1 tablet (800 mg total) by mouth 3 (three) times daily. 02/08/14   Eber Hong, MD  Multiple Vitamin (MULTIVITAMIN WITH MINERALS) TABS tablet Take 1 tablet by mouth daily.    [provider]  omeprazole (PRILOSEC) 20 MG capsule Take 1 capsule (20 mg total) by mouth daily. 05/24/13   Kirichenko, Tatyana, PA-C  sucralfate (CARAFATE) 1 G tablet Take 1 tablet (1 g total) by mouth 4 (four) times daily -  with meals and at bedtime. 05/24/13   Jaynie Crumble, PA-C    Family History No family history on file.  Social History Social History  Substance Use Topics  .  Smoking status: Never Smoker  . Smokeless tobacco: Never Used  . Alcohol use No     Comment: occ     Allergies   Penicillins   Review of Systems Review of Systems ROS: Statement: All systems negative except as marked or noted in the HPI; Constitutional: Negative for fever and chills. ; ; Eyes: Negative for eye pain, redness and discharge. ; ; ENMT: Negative for ear pain, hoarseness, nasal congestion, sinus pressure and sore throat. ; ; Cardiovascular: +CP, SOB. Negative for palpitations, diaphoresis, and peripheral edema. ; ; Respiratory: Negative for cough, wheezing and stridor. ; ; Gastrointestinal: +nausea. Negative for vomiting, diarrhea, abdominal pain, blood in stool, hematemesis, jaundice and rectal bleeding. . ; ; Genitourinary: Negative for dysuria, flank pain and hematuria. ; ; Musculoskeletal: Negative for back pain and neck pain. Negative for swelling and trauma.; ; Skin: Negative for pruritus, rash, abrasions, blisters, bruising and skin lesion.; ; Neuro: Negative for headache, lightheadedness and neck stiffness. Negative for weakness, altered level of consciousness, altered mental status, extremity weakness, paresthesias, involuntary movement, seizure and syncope.       Physical Exam Updated Vital Signs BP (!) 130/93   Pulse 77   Temp 97.9 F (36.6 C)   Resp 18   Ht 6\' 2"  (1.88 m)   Wt 110.7 kg (244 lb)   SpO2 97%   BMI 31.33 kg/m  Physical Exam 1005: Physical examination:  Nursing notes reviewed; Vital signs and O2 SAT reviewed;  Constitutional: Well developed, Well nourished, Well hydrated, In no acute distress; Head:  Normocephalic, atraumatic; Eyes: EOMI, PERRL, No scleral icterus; ENMT: Mouth and pharynx normal, Mucous membranes moist; Neck: Supple, Full range of motion, No lymphadenopathy; Cardiovascular: Regular rate and rhythm, No gallop; Respiratory: Breath sounds clear & equal bilaterally, No wheezes.  Speaking full sentences with ease, Normal respiratory  effort/excursion; Chest: Nontender, Movement normal; Abdomen: Soft, +mild LUQ tenderness to palp. No rebound or guarding. Nondistended, Normal bowel sounds; Genitourinary: No CVA tenderness; Extremities: Pulses normal, No tenderness, No edema, No calf edema or asymmetry.; Neuro: AA&Ox3, Major CN grossly intact.  Speech clear. No gross focal motor or sensory deficits in extremities.; Skin: Color normal, Warm, Dry.   ED Treatments / Results  Labs (all labs ordered are listed, but only abnormal results are displayed)   EKG  EKG Interpretation  Date/Time:  Wednesday July 28 2016 10:00:40 EDT Ventricular Rate:  81 PR Interval:    QRS Duration: 92 QT Interval:  391 QTC Calculation: 454 R Axis:   -5 Text Interpretation:  Sinus rhythm Borderline T wave abnormalities Baseline wander When compared with ECG of 10/27/2015 Nonspecific T wave abnormality is now Present Confirmed by [facility]  MD, Nicholos JohnsKATHLEEN [phone]([id number]) on 07/28/2016 10:09:58 AM       Radiology   Procedures Procedures (including critical care time)  Medications Ordered in ED Medications  aspirin chewable tablet 324 mg (not administered)  nitroGLYCERIN (NITROSTAT) SL tablet 0.4 mg (not administered)     Initial Impression / Assessment and Plan / ED Course  I have reviewed the triage vital signs and the nursing notes.  Pertinent labs & imaging results that were available during my care of the patient were reviewed by me and considered in my medical decision making (see chart for details).  MDM Reviewed: previous chart, nursing note and vitals Reviewed previous: labs and ECG Interpretation: labs, ECG and x-ray   Results for orders placed or performed during the hospital encounter of 07/28/16  Troponin I  Result Value Ref Range   Troponin I <0.03 <0.03 ng/mL  CBC with Differential  Result Value Ref Range   WBC 6.0 4.0 - 10.5 K/uL   RBC 5.22 4.22 - 5.81 MIL/uL   Hemoglobin 15.7 13.0 - 17.0 g/dL   HCT 21.343.7 08.639.0 - 57.852.0 %    MCV 83.7 78.0 - 100.0 fL   MCH 30.1 26.0 - 34.0 pg   MCHC 35.9 30.0 - 36.0 g/dL   RDW 46.912.8 62.911.5 - 52.815.5 %   Platelets 154 150 - 400 K/uL   Neutrophils Relative % 77 %   Neutro Abs 4.6 1.7 - 7.7 K/uL   Lymphocytes Relative 12 %   Lymphs Abs 0.7 0.7 - 4.0 K/uL   Monocytes Relative 9 %   Monocytes Absolute 0.5 0.1 - 1.0 K/uL   Eosinophils Relative 2 %   Eosinophils Absolute 0.1 0.0 - 0.7 K/uL   Basophils Relative 0 %   Basophils Absolute 0.0 0.0 - 0.1 K/uL  Comprehensive metabolic panel  Result Value Ref Range   Sodium 137 135 - 145 mmol/L   Potassium 3.8 3.5 - 5.1 mmol/L   Chloride 105 101 - 111 mmol/L   CO2 24 22 - 32 mmol/L   Glucose, Bld 107 (H) 65 - 99 mg/dL   BUN 16 6 - 20 mg/dL   Creatinine, Ser 4.130.95 0.61 - 1.24 mg/dL   Calcium  9.0 8.9 - 10.3 mg/dL   Total Protein 7.2 6.5 - 8.1 g/dL   Albumin 4.2 3.5 - 5.0 g/dL   AST 25 15 - 41 U/L   ALT 31 17 - 63 U/L   Alkaline Phosphatase 73 38 - 126 U/L   Total Bilirubin 1.1 0.3 - 1.2 mg/dL   GFR calc non Af Amer >60 >60 mL/min   GFR calc Af Amer >60 >60 mL/min   Anion gap 8 5 - 15  Lipase, blood  Result Value Ref Range   Lipase 35 11 - 51 U/L   Dg Chest 2 View Result Date: 07/28/2016 CLINICAL DATA:  Centralized chest pain.  Nausea. EXAM: CHEST  2 VIEW COMPARISON:  10/27/2015; 05/23/2013 FINDINGS: Grossly unchanged cardiac silhouette and mediastinal contours. Grossly unchanged mild diffuse slightly nodular thickening of the pulmonary interstitium. No focal airspace opacities. No pleural effusion or pneumothorax. No evidence of edema. No acute osseus abnormalities. IMPRESSION: Chronic bronchitic change without acute cardiopulmonary disease. Electronically Signed   By: Simonne Come M.D.   On: 07/28/2016 11:00    1235:   ED RN states pt c/o frequent N/V for the past year, was seen by PMD 2 days ago and referred to GI MD.  Pt and wife, however, deny nausea and/or vomiting as preceding CP this morning, and relate the above HPI to me. Pt  states he feels better after ASA, SL ntg and IV morphine. ED RN states pt "coughed a lot" during SL ntg administration. T/C to [facility]. Arbutus Leas, case discussed, including:  HPI, pertinent PM/SHx, VS/PE, dx testing, ED course and treatment:  Agreeable to admit.    Final Clinical Impressions(s) / ED Diagnoses   Final diagnoses:  None    New Prescriptions New Prescriptions   No medications on file     Avion Jester, DO 08/02/16 1517

## 2016-07-28 NOTE — H&P (Signed)
History and Physical  Alexander Carroll ZOX:[id number] DOB: Sep 18, 1965 DOA: 07/28/2016   PCP: [facility]   Patient coming from: Home  Chief Complaint: chest pain  HPI:  Alexander Carroll is a 51 y.o. male with medical history of 51 year old male with no chronic medical problems presented with sharp substernal chest pain when he woke up on the morning of 07/28/2016. The patient had an episode of nausea and vomiting without any shortness of breath, dizziness, syncope. The patient denies any recent dyspnea on exertion or chest pain. He complains of a nonproductive cough of one day duration. He denies any hemoptysis, shortness of breath. He does not smoke or drink any alcohol. He states that his chest pain is sharp in character and is worst with coughing. He states that he has been intermittent nausea and vomiting approximately 2-3 times per week after eating certain meals. There is no hematemesis, melena, diarrhea, abdominal pain. He denies any fevers, chills, headache, neck pain, dysuria, hematuria.  In the emergency department, the patient was afebrile and hemodynamically stable saturating 97% on room air. His BMP, LFTs, and CBC were unremarkable. EKG shows sinus rhythm with nonspecific T-wave changes. Chest x-ray showed chronic bronchitic changes without consolidation or edema. Initial troponin was negative.  Assessment/Plan: Atypical chest pain -Appears to be GI versus musculoskeletal related -Chest pain is reproducible with palpation of the chest -Cycle troponins -Check A1c -Check lipid panel -EKG with sinus rhythm, nonspecific T-wave changes -echo -UDS  Nausea and vomiting -Suspect the patient has a degree of gastritis/GERD -GI cocktail -start PPI -check lipase -UA with micro    Past Medical History:  Diagnosis Date  . Hiatal hernia   . Hiatal hernia    History reviewed. No pertinent surgical history. Social History:  reports that he has never smoked. He  has never used smokeless tobacco. He reports that he does not drink alcohol or use drugs.   No family history on file.   Allergies  Allergen Reactions  . Penicillins Hives     Prior to Admission medications   Medication Sig Start Date End Date Taking? Authorizing Provider  Multiple Vitamin (MULTIVITAMIN WITH MINERALS) TABS tablet Take 1 tablet by mouth daily.   Yes [provider]    Review of Systems:  Constitutional:  No weight loss, night sweats, Fevers, chills, fatigue.  Head&Eyes: No headache.  No vision loss.  No eye pain or scotoma ENT:  No Difficulty swallowing,Tooth/dental problems,Sore throat,  No ear ache, post nasal drip,  Cardio-vascular:  No  Orthopnea, PND, swelling in lower extremities,  dizziness, palpitations  GI:  No  abdominal pain,  diarrhea, loss of appetite, hematochezia, melena, heartburn, indigestion, Resp:  No shortness of breath with exertion or at rest. No cough. No coughing up of blood .No wheezing.No chest wall deformity  Skin:  no rash or lesions.  GU:  no dysuria, change in color of urine, no urgency or frequency. No flank pain.  Musculoskeletal:  No joint pain or swelling. No decreased range of motion. No back pain.  Psych:  No change in mood or affect. No depression or anxiety. Neurologic: No headache, no dysesthesia, no focal weakness, no vision loss. No syncope  Physical Exam: Vitals:   07/28/16 1000 07/28/16 1151 07/28/16 1157 07/28/16 1200  BP:  (!) 138/96 (!) 134/91 125/80  Pulse: 91 73 90 87  Resp:  17  (!) 24  Temp:      SpO2: 96%   94%  Weight:  Height:       General:  A&O x 3, NAD, nontoxic, pleasant/cooperative Head/Eye: No conjunctival hemorrhage, no icterus, [name], No nystagmus ENT:  No icterus,  No thrush, good dentition, no pharyngeal exudate Neck:  No masses, no lymphadenpathy, no bruits CV:  RRR, no rub, no gallop, no S3 Lung:  CTAB, good air movement, no wheeze, no rhonchi Abdomen:  soft/+epigastric pain, +BS, nondistended, no peritoneal signs Ext: No cyanosis, No rashes, No petechiae, No lymphangitis, No edema Neuro: CNII-XII intact, strength 4/5 in bilateral upper and lower extremities, no dysmetria  Labs on Admission:  Basic Metabolic Panel:  Recent Labs Lab 07/28/16 1023  NA 137  K 3.8  CL 105  CO2 24  GLUCOSE 107*  BUN 16  CREATININE 0.95  CALCIUM 9.0   Liver Function Tests:  Recent Labs Lab 07/28/16 1023  AST 25  ALT 31  ALKPHOS 73  BILITOT 1.1  PROT 7.2  ALBUMIN 4.2    Recent Labs Lab 07/28/16 1023  LIPASE 35   No results for input(s): AMMONIA in the last 168 hours. CBC:  Recent Labs Lab 07/28/16 1009  WBC 6.0  NEUTROABS 4.6  HGB 15.7  HCT 43.7  MCV 83.7  PLT 154   Coagulation Profile: No results for input(s): INR, PROTIME in the last 168 hours. Cardiac Enzymes:  Recent Labs Lab 07/28/16 1009  TROPONINI <0.03   BNP: Invalid input(s): POCBNP CBG: No results for input(s): GLUCAP in the last 168 hours. Urine analysis: No results found for: COLORURINE, APPEARANCEUR, LABSPEC, PHURINE, GLUCOSEU, HGBUR, BILIRUBINUR, KETONESUR, PROTEINUR, UROBILINOGEN, NITRITE, LEUKOCYTESUR Sepsis Labs: @LABRCNTIP (procalcitonin:4,lacticidven:4) )No results found for this or any previous visit (from the past 240 hour(s)).   Radiological Exams on Admission: Dg Chest 2 View  Result Date: 07/28/2016 CLINICAL DATA:  Centralized chest pain.  Nausea. EXAM: CHEST  2 VIEW COMPARISON:  10/27/2015; 05/23/2013 FINDINGS: Grossly unchanged cardiac silhouette and mediastinal contours. Grossly unchanged mild diffuse slightly nodular thickening of the pulmonary interstitium. No focal airspace opacities. No pleural effusion or pneumothorax. No evidence of edema. No acute osseus abnormalities. IMPRESSION: Chronic bronchitic change without acute cardiopulmonary disease. Electronically Signed   By: Simonne ComeJohn  Watts M.D.   On: 07/28/2016 11:00    EKG: Independently  reviewed. Sinus, nonspecific T wave changes    Time spent:60 minutes Code Status:  FULL Family Communication:  Spouse updated at bedside Disposition Plan: expect 1 day hospitalization Consults called: none DVT Prophylaxis: [place] Lovenox  Faythe Heitzenrater, DO  [facility] Pager [phone]  If 7PM-7AM, please contact night-coverage [email or web address] Password [email or web address] 07/28/2016, 1:05 PM

## 2016-07-28 NOTE — Progress Notes (Signed)
*  PRELIMINARY RESULTS* Echocardiogram 2D Echocardiogram has been performed.  Jeryl Columbialliott, Kourtnee Lahey 07/28/2016, 4:19 PM

## 2016-07-28 NOTE — ED Notes (Signed)
Pt coughed and stated made his cp worse

## 2016-07-28 NOTE — ED Triage Notes (Signed)
C/o nausea onset yesterday and center chest pain

## 2016-07-29 DIAGNOSIS — G43A0 Cyclical vomiting, not intractable: Secondary | ICD-10-CM | POA: Diagnosis not present

## 2016-07-29 DIAGNOSIS — R079 Chest pain, unspecified: Secondary | ICD-10-CM | POA: Diagnosis not present

## 2016-07-29 LAB — LIPID PANEL
CHOL/HDL RATIO: 5.4 ratio
Cholesterol: 141 mg/dL (ref 0–200)
HDL: 26 mg/dL — AB (ref >40)
LDL CALC: 82 mg/dL (ref 0–99)
Triglycerides: 165 mg/dL — ABNORMAL HIGH (ref <150)
VLDL: 33 mg/dL (ref 0–40)

## 2016-07-29 LAB — BASIC METABOLIC PANEL
ANION GAP: 8 (ref 5–15)
BUN: 17 mg/dL (ref 6–20)
CHLORIDE: 104 mmol/L (ref 101–111)
CO2: 26 mmol/L (ref 22–32)
Calcium: 8.5 mg/dL — ABNORMAL LOW (ref 8.9–10.3)
Creatinine, Ser: 1.07 mg/dL (ref 0.61–1.24)
GFR calc Af Amer: >60 mL/min (ref >60)
GFR calc non Af Amer: >60 mL/min (ref >60)
GLUCOSE: 110 mg/dL — AB (ref 65–99)
POTASSIUM: 3.8 mmol/L (ref 3.5–5.1)
Sodium: 138 mmol/L (ref 135–145)

## 2016-07-29 LAB — HEMOGLOBIN A1C
Hgb A1c MFr Bld: 5 % (ref 4.8–5.6)
Mean Plasma Glucose: 97 mg/dL

## 2016-07-29 LAB — HIV ANTIBODY (ROUTINE TESTING W REFLEX): HIV Screen 4th Generation wRfx: NONREACTIVE

## 2016-07-29 MED ORDER — BENZONATATE 100 MG PO CAPS: 200.0000 mg | ORAL_CAPSULE | Freq: Three times a day (TID) | ORAL | 0 refills | Status: DC

## 2016-07-29 MED ORDER — PANTOPRAZOLE SODIUM 40 MG PO TBEC: 40.0000 mg | DELAYED_RELEASE_TABLET | Freq: Every day | ORAL | 1 refills | Status: DC

## 2016-07-29 NOTE — Discharge Summary (Signed)
Physician Discharge Summary  Alexander Carroll YHC:[id number] DOB: Feb 26, 1965 DOA: 07/28/2016  PCP: [facility]  Admit date: 07/28/2016 Discharge date: 07/29/2016  Admitted From: Home Disposition:  Home   Recommendations for Outpatient Follow-up:  1. Follow up with PCP in 1-2 weeks 2. Please obtain BMP/CBC in one week    Discharge Condition: Stable CODE STATUS:FULL Diet recommendation: Heart Healthy   Brief/Interim Summary: 51 year old male with no chronic medical problems presented with sharp substernal chest pain when he woke up on the morning of 07/28/2016. The patient had an episode of nausea and vomiting without any shortness of breath, dizziness, syncope. The patient denies any recent dyspnea on exertion or exertional chest pain. He complains of a nonproductive cough of one day duration. He denies any hemoptysis, shortness of breath. He does not smoke or drink any alcohol. He states that his chest pain is sharp in character and is worst with coughing. He states that he has been intermittent nausea and vomiting approximately 2-3 times per week after eating certain meals. There is no hematemesis, melena, diarrhea, abdominal pain. He denies any fevers, chills, headache, neck pain, dysuria, hematuria  Discharge Diagnoses:  Atypical chest pain -Appears to be GI versus musculoskeletal related -Chest pain is reproducible with palpation of the chest -Cycle troponins--neg x 3 -Check A1c--5.0 -Check lipid panel--LDL 82, HDL 26 -EKG with sinus rhythm, nonspecific T-wave changes -echo--EF 60-65%, no WMA, trivial TR -UDS--positive opiates (after ED gave morphine) -HEART score = 2  Nausea and vomiting -Suspect the patient has a degree of gastritis/GERD -GI cocktail--symptoms improved -start PPI -check lipase--28 -UA with micro--neg pyuria or proteinuria  Cough -likely related to GERD with possible superimposed viral URI -home with tessalon perles -no abx  indicated at this time    Discharge Instructions  Discharge Instructions    Diet - low sodium heart healthy    Complete by:  As directed    Increase activity slowly    Complete by:  As directed      Allergies as of 07/29/2016      Reactions   Penicillins Hives      Medication List    TAKE these medications   benzonatate 100 MG capsule Commonly known as:  TESSALON PERLES Take 2 capsules (200 mg total) by mouth 3 (three) times daily.   multivitamin with minerals Tabs tablet Take 1 tablet by mouth daily.   pantoprazole 40 MG tablet Commonly known as:  PROTONIX Take 1 tablet (40 mg total) by mouth daily.       Allergies  Allergen Reactions  . Penicillins Hives    Consultations:  none   Procedures/Studies: Dg Chest 2 View  Result Date: 07/28/2016 CLINICAL DATA:  Centralized chest pain.  Nausea. EXAM: CHEST  2 VIEW COMPARISON:  10/27/2015; 05/23/2013 FINDINGS: Grossly unchanged cardiac silhouette and mediastinal contours. Grossly unchanged mild diffuse slightly nodular thickening of the pulmonary interstitium. No focal airspace opacities. No pleural effusion or pneumothorax. No evidence of edema. No acute osseus abnormalities. IMPRESSION: Chronic bronchitic change without acute cardiopulmonary disease. Electronically Signed   By: Simonne Come M.D.   On: 07/28/2016 11:00         Discharge Exam: Vitals:   07/28/16 2144 07/29/16 0624  BP: 134/79 128/85  Pulse: 69 63  Resp: 18 18  Temp: 97.7 F (36.5 C) 97.7 F (36.5 C)   Vitals:   07/28/16 1330 07/28/16 1430 07/28/16 2144 07/29/16 0624  BP: 124/85 130/78 134/79 128/85  Pulse: 63 76 69 63  Resp: 19 20 18 18   Temp:  97.6 F (36.4 C) 97.7 F (36.5 C) 97.7 F (36.5 C)  TempSrc:  Oral Oral Oral  SpO2: 94% 96% 98% 94%  Weight:  111.1 kg (245 lb)    Height:  6\' 2"  (1.88 m)      General: Pt is alert, awake, not in acute distress Cardiovascular: RRR, S1/S2 +, no rubs, no gallops Respiratory: CTA  bilaterally, no wheezing, no rhonchi Abdominal: Soft, NT, ND, bowel sounds + Extremities: no edema, no cyanosis   The results of significant diagnostics from this hospitalization (including imaging, microbiology, ancillary and laboratory) are listed below for reference.    Significant Diagnostic Studies: Dg Chest 2 View  Result Date: 07/28/2016 CLINICAL DATA:  Centralized chest pain.  Nausea. EXAM: CHEST  2 VIEW COMPARISON:  10/27/2015; 05/23/2013 FINDINGS: Grossly unchanged cardiac silhouette and mediastinal contours. Grossly unchanged mild diffuse slightly nodular thickening of the pulmonary interstitium. No focal airspace opacities. No pleural effusion or pneumothorax. No evidence of edema. No acute osseus abnormalities. IMPRESSION: Chronic bronchitic change without acute cardiopulmonary disease. Electronically Signed   By: Simonne ComeJohn  Watts M.D.   On: 07/28/2016 11:00     Microbiology: No results found for this or any previous visit (from the past 240 hour(s)).   Labs: Basic Metabolic Panel:  Recent Labs Lab 07/28/16 1023 07/29/16 0425  NA 137 138  K 3.8 3.8  CL 105 104  CO2 24 26  GLUCOSE 107* 110*  BUN 16 17  CREATININE 0.95 1.07  CALCIUM 9.0 8.5*   Liver Function Tests:  Recent Labs Lab 07/28/16 1023  AST 25  ALT 31  ALKPHOS 73  BILITOT 1.1  PROT 7.2  ALBUMIN 4.2    Recent Labs Lab 07/28/16 1023 07/28/16 1847  LIPASE 35 28   No results for input(s): AMMONIA in the last 168 hours. CBC:  Recent Labs Lab 07/28/16 1009  WBC 6.0  NEUTROABS 4.6  HGB 15.7  HCT 43.7  MCV 83.7  PLT 154   Cardiac Enzymes:  Recent Labs Lab 07/28/16 1009 07/28/16 1847 07/28/16 2242  TROPONINI <0.03 <0.03 <0.03   BNP: Invalid input(s): POCBNP CBG: No results for input(s): GLUCAP in the last 168 hours.  Time coordinating discharge:  Greater than 30 minutes  Signed:  Milka Windholz, DO [facility] Pager: [phone] 07/29/2016, 9:08 AM

## 2016-07-29 NOTE — Progress Notes (Signed)
Pt's IV catheter removed and intact. Pt's IV site clean dry and intact. Discharge instructions including medications and follow up appointments were reviewed and discussed with patient. All questions were answered and no further questions at this time. Pt verbalized understanding of discharge instructions.  Pt in stable condition and in no acute distress at time of discharge. Pt will be escorted by nurse tech.  

## 2016-07-31 LAB — URINE CULTURE: Culture: <10000 — AB

## 2016-08-12 ENCOUNTER — Encounter: Payer: Self-pay | Admitting: Gastroenterology

## 2016-10-04 ENCOUNTER — Encounter: Payer: Self-pay | Admitting: Nurse Practitioner

## 2016-10-04 ENCOUNTER — Other Ambulatory Visit: Payer: Self-pay

## 2016-10-04 ENCOUNTER — Ambulatory Visit (INDEPENDENT_AMBULATORY_CARE_PROVIDER_SITE_OTHER): Payer: BLUE CROSS/BLUE SHIELD | Admitting: Nurse Practitioner

## 2016-10-04 VITALS — BP 134/87 | HR 70 | Temp 97.3°F | Ht 73.0 in | Wt 236.0 lb

## 2016-10-04 DIAGNOSIS — R1310 Dysphagia, unspecified: Secondary | ICD-10-CM

## 2016-10-04 DIAGNOSIS — K219 Gastro-esophageal reflux disease without esophagitis: Secondary | ICD-10-CM

## 2016-10-04 DIAGNOSIS — G43A0 Cyclical vomiting, not intractable: Secondary | ICD-10-CM | POA: Diagnosis not present

## 2016-10-04 DIAGNOSIS — R1115 Cyclical vomiting syndrome unrelated to migraine: Secondary | ICD-10-CM

## 2016-10-04 DIAGNOSIS — R1319 Other dysphagia: Secondary | ICD-10-CM

## 2016-10-04 MED ORDER — PANTOPRAZOLE SODIUM 40 MG PO TBEC: 40.0000 mg | DELAYED_RELEASE_TABLET | Freq: Two times a day (BID) | ORAL | 3 refills | Status: DC

## 2016-10-04 NOTE — Progress Notes (Addendum)
REVIEWED-NO ADDITIONAL RECOMMENDATIONS.  Primary Care Physician:  Assunta Found, MD Primary Gastroenterologist:  Dr. Darrick Penna  Chief Complaint  Patient presents with  . Emesis    x 76yr, occurs 15-20 minutes after eating; lost 9 lbs in approx 6 months; has hiatal hernia  . Gas    burps alot    HPI:   Alexander Carroll is a 51 y.o. male who presents On referral from primary care for chronic vomiting. PCP notes reviewed. Last saw primary care 07/26/2016. At that point it was described the patient has a history of chronic vomiting typically 15-30 minutes after eating for years with increased frequency recently. History of hiatal hernia on endoscopy. This typically happens a few times a week, patient is unsure what triggers vomiting. The patient does not have teeth and there was a question if inability to chew his contributing. Per primary care, does not appear to be related to types of food eaten.  Today he states he's doing ok overall. Last TCS and EGD 3 years ago at [facility]. He lives in [place]. He was having some minimal vomiting back then. His vomiting started getting worse 3-4 months ago. Has some breakthrough GERD symptoms about once a week despite Protonix. Has a lot of belching as well. Typically vomits 5-20 minutes after eating. Now happening about 3-4 times a week. Denies hematemesis, hematochezia, melena. Some minimal abdominal pain "sometimes, it depends" and will sometimes improve if he vomits. Does have some solid food dysphagia symptoms, states every time he vomits it's after "food sticking." States his vomiting is less viceral vomiting and more regurgitation. Denies fever, chills, unintentional weight loss. Denies chest pain, dyspnea, dizziness, lightheadedness, syncope, near syncope. Denies any other upper or lower GI symptoms.  Past Medical History:  Diagnosis Date  . GERD (gastroesophageal reflux disease)   . Hiatal hernia   . Hiatal hernia     Past Surgical History:    Procedure Laterality Date  . NONE TO DATE      Current Outpatient Prescriptions  Medication Sig Dispense Refill  . benzonatate (TESSALON PERLES) 100 MG capsule Take 2 capsules (200 mg total) by mouth 3 (three) times daily. 20 capsule 0  . Multiple Vitamin (MULTIVITAMIN WITH MINERALS) TABS tablet Take 1 tablet by mouth. Doesn't take everyday    . pantoprazole (PROTONIX) 40 MG tablet Take 1 tablet (40 mg total) by mouth daily. 30 tablet 1   No current facility-administered medications for this visit.     Allergies as of 10/04/2016 - Review Complete 10/04/2016  Allergen Reaction Noted  . Penicillins Hives 05/23/2013    Family History  Problem Relation Age of Onset  . Colon cancer Neg Hx   . Gastric cancer Neg Hx   . Esophageal cancer Neg Hx     Social History   Social History  . Marital status: Married    Spouse name: N/A  . Number of children: N/A  . Years of education: N/A   Occupational History  . Not on file.   Social History Main Topics  . Smoking status: Never Smoker  . Smokeless tobacco: Never Used  . Alcohol use Yes     Comment: occ  . Drug use: No  . Sexual activity: Yes   Other Topics Concern  . Not on file   Social History Narrative  . No narrative on file    Review of Systems: Complete ROS negative except as per HPI.    Physical Exam: BP 134/87   Pulse 70  Temp (!) 97.3 F (36.3 C) (Oral)   Ht  (1.854 m)   Wt 236 lb (107 kg)   BMI 31.14 kg/m  General:   Alert and oriented. Pleasant and cooperative. Well-nourished and well-developed.  Eyes:  Without icterus, sclera clear and conjunctiva pink.  Ears:  Normal auditory acuity. Cardiovascular:  S1, S2 present without murmurs appreciated. Extremities without clubbing or edema. Respiratory:  Clear to auscultation bilaterally. No wheezes, rales, or rhonchi. No distress.  Gastrointestinal:  +BS, soft, and non-distended. Mild epigastric and LUQ TTP. No HSM noted. No guarding or rebound. No  masses appreciated.  Rectal:  Deferred  Musculoskalatal:  Symmetrical without gross deformities. Neurologic:  Alert and oriented x4;  grossly normal neurologically. Psych:  Alert and cooperative. Normal mood and affect. Heme/Lymph/Immune: No excessive bruising noted.    10/04/2016 11:13 AM   Disclaimer: This note was dictated with voice recognition software. Similar sounding words can inadvertently be transcribed and may not be corrected upon review.

## 2016-10-04 NOTE — Progress Notes (Signed)
CC'D TO PCP °

## 2016-10-04 NOTE — Assessment & Plan Note (Signed)
The patient does describe esophageal/pharyngeal dysphagia. Upon further discussion he states that most the time when he vomits is after having dysphagia symptoms. Query element of regurgitation rather than true vomiting. His symptoms are likely worsened because he does not have any teeth and is not able to afford dentures at this time. No other red flag/warning signs or symptoms. Last colonoscopy and endoscopy 3 years ago and I will request these records. Recommend dysphagia 3 diet for now. We will proceed with upper endoscopy with possible dilation to further evaluate his symptoms. Return for follow-up in 3 months. If his symptoms are due to dysphagia we can encourage him to pursue dentures through element of possible medical necessity.  Proceed with EGD +/- dilation with Dr. Darrick Penna in the near future. The risks, benefits, and alternatives have been discussed in detail with the patient. They state understanding and desire to proceed.   The patient is not on any anticoagulants, anxiolytics, chronic pain medications, or antidepressants. Conscious sedation should be adequate for his procedure.

## 2016-10-04 NOTE — Patient Instructions (Addendum)
1. I have increased your Protonix to twice a day. 2. We will schedule your procedure for you. 3. We will request [facility]. 4. I'm providing further information on a dysphagia 3 diet to help prevent further regurgitation/vomiting. 5. Return for follow-up in 3 months. 6. Call us if you have any questions or concerns.     Dysphagia Dysphagia is trouble swallowing. This condition occurs when solids and liquids stick in a person's throat on the way down to the stomach, or when food takes longer to get to the stomach. You may have problems swallowing food, liquids, or both. You may also have pain while trying to swallow. It may take you more time and effort to swallow something. What are the causes? This condition is caused by:  Problems with the muscles. They may make it difficult for you to move food and liquids through the tube that connects your mouth to your stomach (esophagus). You may have ulcers, scar tissue, or inflammation that blocks the normal passage of food and liquids. Causes of these problems include: ? Acid reflux from your stomach into your esophagus (gastroesophageal reflux). ? Infections. ? Radiation treatment for cancer. ? Medicines taken without enough fluids to wash them down into your stomach.  Nerve problems. These prevent signals from being sent to the muscles of your esophagus to squeeze (contract) and move what you swallow down to your stomach.  Globus pharyngeus. This is a common problem that involves feeling like something is stuck in the throat or a sense of trouble with swallowing even though nothing is wrong with the swallowing passages.  Stroke. This can affect the nerves and make it difficult to swallow.  Certain conditions, such as cerebral palsy or Parkinson disease.  What are the signs or symptoms? Common symptoms of this condition include:  A feeling that solids or liquids are stuck in your throat on  the way down to the stomach.  Food taking too long to get to the stomach.  Other symptoms include:  Food moving back from your stomach to your mouth (regurgitation).  Noises coming from your throat.  Chest discomfort with swallowing.  A feeling of fullness when swallowing.  Drooling, especially when the throat is blocked.  Pain while swallowing.  Heartburn.  Coughing or gagging while trying to swallow.  How is this diagnosed? This condition is diagnosed by:  Barium X-ray. In this test, you swallow a white substance (contrast medium)that sticks to the inside of your esophagus. X-ray images are then taken.  Endoscopy. In this test, a flexible telescope is inserted down your throat to look at your esophagus and your stomach.  CT scans and MRI.  How is this treated? Treatment for dysphagia depends on the cause of the condition:  If the dysphagia is caused by acid reflux or infection, medicines may be used. They may include antibiotics and heartburn medicines.  If the dysphagia is caused by problems with your muscles, swallowing therapy may be used to help you strengthen your swallowing muscles. You may have to do specific exercises to strengthen the muscles or stretch them.  If the dysphagia is caused by a blockage or mass, procedures to remove the blockage may be done. You may need surgery and a feeding tube.  You may need to make diet changes. Ask your health care provider for specific instructions. Follow these instructions at home: Eating and drinking  Try to eat soft food that is easier to swallow.  Follow any  diet changes as told by your health care provider.  Cut your food into small pieces and eat slowly.  Eat and drink only when you are sitting upright.  Do not drink alcohol or caffeine. If you need help quitting, ask your health care provider. General instructions  Check your weight every day to make sure you are not losing weight.  Take over-the-counter  and prescription medicines only as told by your health care provider.  If you were prescribed an antibiotic medicine, take it as told by your health care provider. Do not stop taking the antibiotic even if you start to feel better.  Do not use any products that contain nicotine or tobacco, such as cigarettes and e-cigarettes. If you need help quitting, ask your health care provider.  Keep all follow-up visits as told by your health care provider. This is important. Contact a health care provider if:  You lose weight because you cannot swallow.  You cough when you drink liquids (aspiration).  You cough up partially digested food. Get help right away if:  You cannot swallow your saliva.  You have shortness of breath or a fever, or both.  You have a hoarse voice and also have trouble swallowing. Summary  Dysphagia is trouble swallowing. This condition occurs when solids and liquids stick in a person's throat on the way down to the stomach, or when food takes longer to get to the stomach.  Dysphagia has many possible causes and symptoms.  Treatment for dysphagia depends on the cause of the condition. This information is not intended to replace advice given to you by your health care provider. Make sure you discuss any questions you have with your health care provider. Document Released: 12/26/1999 Document Revised: 12/18/2015 Document Reviewed: 12/18/2015 Elsevier Interactive Patient Education  2017 [employer]     Dysphagia Diet Level 3, Mechanically Advanced The dysphagia level 3 diet includes foods that are soft, moist, and can be chopped into 1-inch chunks. This diet is helpful for people with mild swallowing difficulties. It reduces the risk of food getting caught in the windpipe, trachea, or lungs. What do I need to know about this diet?  You may eat foods that are soft and moist.  If you were on the dysphagia level 1 or level 2 diets, you may eat any of the foods  included on those lists.  Avoid foods that are dry, hard, sticky, chewy, coarse, and crunchy. Also avoid large cuts of food.  Take small bites. Each bite should contain 1 inch or less of food.  Thicken liquids if instructed by your health care provider. Follow your health care provider's instructions on how to do this and to what consistency.  See your dietitian or speech language pathologist regularly for help with your dietary changes. What foods can I eat? Grains Moist breads without nuts or seeds. Biscuits, muffins, pancakes, and waffles well-moistened with syrup, jelly, margarine, or butter. Smooth cereals with plenty of milk to moisten them. Moist bread stuffing. Moist rice. Vegetables All cooked, soft vegetables. Shredded lettuce. Tender fried potatoes. Fruits All canned and cooked fruits. Soft, peeled fresh fruits, such as peaches, nectarines, kiwis, cantaloupe, honeydew melon, and watermelon without seeds. Soft berries, such as strawberries. Meat and Other Protein Sources Moist ground or finely diced or sliced meats. Solid, tender cuts of meat. Meatloaf. Hamburger with a bun. Sausage patty. Deli thin-sliced lunch meat. Chicken, egg, or tuna salad sandwich. Sloppy joe. Moist fish. Eggs prepared any way. Casseroles with small chunks  of meats, ground meats, or tender meats. Dairy Cheese spreads without coarse large chunks. Shredded cheese. Cheese slices. Cottage cheese. Milk at the right texture. Smooth frappes. Yogurt without nuts or coconut. Ask your health care provider whether you can have frozen desserts (such as malts or milk shakes) and thin liquids. Sweets/Desserts Soft, smooth, moist desserts. Non-chewy, smooth candy. Jam. Jelly. Honey. Preserves. Ask your health care provider whether you can have frozen desserts. Fats and Oils Butter. Oils. Margarine. Mayonnaise. Gravy. Spreads. Other All seasonings and sweeteners. All sauces without large chunks. The items listed above may  not be a complete list of recommended foods or beverages. Contact your dietitian for more options. What foods are not recommended? Grains Coarse or dry cereals. Dry breads. Toast. Crackers. Tough, crusty breads, such French bread and baguettes. Tough, crisp fried potatoes. Potato skins. Dry bread stuffing. Granola. Popcorn. Chips. Vegetables All raw vegetables except shredded lettuce. Cooked corn. Rubbery or stiff cooked vegetables. Stringy vegetables, such as celery. Fruits Hard fruits that are difficult to chew, such as apples or pears. Stringy, high-pulp fruits, such as pineapple, papaya, or mango. Fruits with tough skins, such as grapes. Coconut. All dried fruits. Fruit leather. Fruit roll-ups. Fruit snacks. Meat and Other Protein Sources Dry or tough meats or poultry. Dry fish. Fish with bones. Peanut butter. All nuts and seeds. Dairy Any with nuts, seeds, chocolate chips, dried fruit, coconut, or pineapple. Sweets/Desserts Dry cakes. Chewy or dry cookies. Any with nuts, seeds, dry fruits, coconut, pineapple, or anything dry, sticky, or hard. Chewy caramel. Licorice. Taffy-type candies. Ask your health care provider whether you can have frozen desserts. Fats and Oils Any with chunks, nuts, seeds, or pineapple. Olives. Rosita Fire. Other Soups with tough or large chunks of meats, poultry, or vegetables. Corn or clam chowder. The items listed above may not be a complete list of foods and beverages to avoid. Contact your dietitian for more information. This information is not intended to replace advice given to you by your health care provider. Make sure you discuss any questions you have with your health care provider. Document Released: 12/28/2004 Document Revised: 06/05/2015 Document Reviewed: 12/11/2012 Elsevier Interactive Patient Education  [employer].

## 2016-10-04 NOTE — Assessment & Plan Note (Signed)
GERD symptoms with breakthrough symptoms about one to 2 times a week. Also with significant belching. At this point I'll increase his Protonix to twice a day. Return for follow-up in 3 months.

## 2016-10-04 NOTE — Assessment & Plan Note (Signed)
The patient initially described vomiting about 5-20 minutes after eating some meals, typically 3-4 times a week. Cannot identify specific foods that trigger symptoms. No essential nausea before his vomiting. After further discussion it sounds less like visceral gastric emptying/vomiting and more likely due to dysphagia and regurgitation. See dysphagia notes below. He could also be having some vomiting due to non-optimally controlled reflux. She further reflux treatments below. Return for follow-up in 3 months.

## 2016-11-12 ENCOUNTER — Ambulatory Visit (HOSPITAL_COMMUNITY)
Admission: RE | Admit: 2016-11-12 | Discharge: 2016-11-12 | Disposition: A | Discharge status: Home / Self Care | Payer: BLUE CROSS/BLUE SHIELD | Source: Ambulatory Visit | Attending: Gastroenterology | Admitting: Gastroenterology

## 2016-11-12 ENCOUNTER — Encounter (HOSPITAL_COMMUNITY): Payer: Self-pay | Admitting: *Deleted

## 2016-11-12 ENCOUNTER — Encounter (HOSPITAL_COMMUNITY)
Admission: RE | Disposition: A | Discharge status: Home / Self Care | Payer: Self-pay | Source: Ambulatory Visit | Attending: Gastroenterology

## 2016-11-12 DIAGNOSIS — K2970 Gastritis, unspecified, without bleeding: Secondary | ICD-10-CM | POA: Diagnosis not present

## 2016-11-12 DIAGNOSIS — R634 Abnormal weight loss: Secondary | ICD-10-CM | POA: Diagnosis not present

## 2016-11-12 DIAGNOSIS — K2950 Unspecified chronic gastritis without bleeding: Secondary | ICD-10-CM | POA: Diagnosis not present

## 2016-11-12 DIAGNOSIS — K2210 Ulcer of esophagus without bleeding: Secondary | ICD-10-CM | POA: Insufficient documentation

## 2016-11-12 DIAGNOSIS — K449 Diaphragmatic hernia without obstruction or gangrene: Secondary | ICD-10-CM | POA: Insufficient documentation

## 2016-11-12 DIAGNOSIS — K210 Gastro-esophageal reflux disease with esophagitis: Secondary | ICD-10-CM | POA: Diagnosis not present

## 2016-11-12 DIAGNOSIS — K222 Esophageal obstruction: Secondary | ICD-10-CM | POA: Insufficient documentation

## 2016-11-12 DIAGNOSIS — R1310 Dysphagia, unspecified: Secondary | ICD-10-CM | POA: Diagnosis not present

## 2016-11-12 DIAGNOSIS — K219 Gastro-esophageal reflux disease without esophagitis: Secondary | ICD-10-CM

## 2016-11-12 DIAGNOSIS — Z79899 Other long term (current) drug therapy: Secondary | ICD-10-CM | POA: Insufficient documentation

## 2016-11-12 HISTORY — PX: BIOPSY: SHX5522

## 2016-11-12 HISTORY — PX: ESOPHAGOGASTRODUODENOSCOPY: SHX5428

## 2016-11-12 HISTORY — PX: SAVORY DILATION: SHX5439

## 2016-11-12 SURGERY — EGD (ESOPHAGOGASTRODUODENOSCOPY)
Anesthesia: Moderate Sedation

## 2016-11-12 MED ORDER — MINERAL OIL PO OIL
TOPICAL_OIL | ORAL | Status: AC
  Filled 2016-11-12: qty 30

## 2016-11-12 MED ORDER — SODIUM CHLORIDE 0.9 % IV SOLN
INTRAVENOUS | Status: DC
  Administered 2016-11-12: 1000 mL via INTRAVENOUS

## 2016-11-12 MED ORDER — MEPERIDINE HCL 100 MG/ML IJ SOLN
INTRAMUSCULAR | Status: DC | PRN
  Administered 2016-11-12: 25 mg via INTRAVENOUS
  Administered 2016-11-12: 50 mg via INTRAVENOUS
  Administered 2016-11-12: 25 mg via INTRAVENOUS

## 2016-11-12 MED ORDER — STERILE WATER FOR IRRIGATION IR SOLN
Status: DC | PRN
  Administered 2016-11-12: 2.5 mL

## 2016-11-12 MED ORDER — LIDOCAINE VISCOUS 2 % MT SOLN
OROMUCOSAL | Status: DC | PRN
  Administered 2016-11-12: 1 via OROMUCOSAL

## 2016-11-12 MED ORDER — LIDOCAINE VISCOUS 2 % MT SOLN
OROMUCOSAL | Status: AC
  Filled 2016-11-12: qty 15

## 2016-11-12 MED ORDER — MIDAZOLAM HCL 5 MG/5ML IJ SOLN
INTRAMUSCULAR | Status: DC | PRN
  Administered 2016-11-12 (×3): 2 mg via INTRAVENOUS

## 2016-11-12 MED ORDER — MEPERIDINE HCL 100 MG/ML IJ SOLN
INTRAMUSCULAR | Status: AC
  Filled 2016-11-12: qty 2

## 2016-11-12 MED ORDER — MIDAZOLAM HCL 5 MG/5ML IJ SOLN
INTRAMUSCULAR | Status: AC
  Filled 2016-11-12: qty 10

## 2016-11-12 NOTE — Discharge Instructions (Signed)
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  You have a MEDIUM SIZE hiatal hernia, AND MILD gastritis. I biopsied your ESOPHAGUS AND stomach.   DRINK WATER TO KEEP YOUR URINE LIGHT YELLOW.  CONTINUE YOUR WEIGHT LOSS EFFORTS.  WHILE I DO NOT WANT TO ALARM YOU, YOUR BODY MASS INDEX IS OVER 30 WHICH MEANS YOU ARE OBESE. OBESITY CAN ACTIVATE CANCER GENES.OBESITY IS ASSOCIATED WITH AN INCREASED RISK FOR CIRRHOSIS AND ALL CANCERS, INCLUDING ESOPHAGEAL AND COLON CANCER. A WEIGHT OF 220 LBS   WILL GET YOUR BODY MASS INDEX(BMI) UNDER 30.  FOLLOW A LOW FAT/HIGH FIBER DIET. AVOID ITEMS THAT CAUSE BLOATING & GAS. SEE INFO BELOW.  AVOID REFLUX TRIGGERS. SEE INFO BELOW.  CONTINUE PROTONIX. TAKE 30 MINUTES PRIOR TO MEALS TWICE DAILY for 3 mos then once daily  YOUR BIOPSY RESULTS WILL BE AVAILABLE IN MY CHART AFTER Nov 6 and MY OFFICE WILL CONTACT YOU IN 10-14 DAYS WITH YOUR RESULTS.   FOLLOW UP IN 4 MOS.   UPPER ENDOSCOPY AFTER CARE Read the instructions outlined below and refer to this sheet in the next week. These discharge instructions provide you with general information on caring for yourself after you leave the hospital. While your treatment has been planned according to the most current medical practices available, unavoidable complications occasionally occur. If you have any problems or questions after discharge, call DR. Kein Carlberg, [phone].  ACTIVITY  You may resume your regular activity, but move at a slower pace for the next 24 hours.   Take frequent rest periods for the next 24 hours.   Walking will help get rid of the air and reduce the bloated feeling in your belly (abdomen).   No driving for 24 hours (because of the medicine (anesthesia) used during the test).   You may shower.   Do not sign any important legal documents or operate any  machinery for 24 hours (because of the anesthesia used during the test).    NUTRITION  Drink plenty of fluids.   You may resume your normal diet as instructed by your doctor.   Begin with a light meal and progress to your normal diet. Heavy or fried foods are harder to digest and may make you feel sick to your stomach (nauseated).   Avoid alcoholic beverages for 24 hours or as instructed.    MEDICATIONS  You may resume your normal medications.   WHAT YOU CAN EXPECT TODAY  Some feelings of bloating in the abdomen.   Passage of more gas than usual.    IF YOU HAD A BIOPSY TAKEN DURING THE UPPER ENDOSCOPY:  Eat a soft diet IF YOU HAVE NAUSEA, BLOATING, ABDOMINAL PAIN, OR VOMITING.    FINDING OUT THE RESULTS OF YOUR TEST Not all test results are available during your visit. DR. Darrick PennaFIELDS WILL CALL YOU WITHIN 14 DAYS OF YOUR PROCEDUE WITH YOUR RESULTS. Do not assume everything is normal if you have not heard from DR. Zackariah Vanderpol, CALL HER OFFICE AT [phone].  SEEK IMMEDIATE MEDICAL ATTENTION AND CALL THE OFFICE: [phone] IF:  You have more than a spotting of blood in your stool.   Your belly is swollen (abdominal distention).   You are nauseated or vomiting.   You have a temperature over 101F.   You have abdominal pain or discomfort that is severe or gets worse throughout the day.    Lifestyle  and home remedies TO CONTROL HEARTBURN/REFLUX You may eliminate or reduce the frequency of heartburn by making the following lifestyle changes:   Control your weight. Being overweight is a major risk factor for heartburn and GERD. Excess pounds put pressure on your abdomen, pushing up your stomach and causing acid to back up into your esophagus.    Eat smaller meals. 4 TO 6 MEALS A DAY. This reduces pressure on the lower esophageal sphincter, helping to prevent the valve from opening and acid from washing back into your esophagus.    Loosen your belt. Clothes that fit  tightly around your waist put pressure on your abdomen and the lower esophageal sphincter.    Eliminate heartburn triggers. Everyone has specific triggers. Common triggers such as fatty or fried foods, spicy food, tomato sauce, carbonated beverages, alcohol, chocolate, mint, garlic, onion, caffeine and nicotine may make heartburn worse.    Avoid stooping or bending. Tying your shoes is OK. Bending over for longer periods to weed your garden isn't, especially soon after eating.    Don't lie down after a meal. Wait at least three to four hours after eating before going to bed, and don't lie down right after eating.    Alternative medicine  Several home remedies exist for treating GERD, but they provide only temporary relief. They include drinking baking soda (sodium bicarbonate) added to water or drinking other fluids such as baking soda mixed with cream of tartar and water.   Although these liquids create temporary relief by neutralizing, washing away or buffering acids, eventually they aggravate the situation by adding gas and fluid to your stomach, increasing pressure and causing more acid reflux. Further, adding more sodium to your diet may increase your blood pressure and add stress to your heart, and excessive bicarbonate ingestion can alter the acid-base balance in your body.     Low-Fat Diet BREADS, CEREALS, PASTA, RICE, DRIED PEAS, AND BEANS These products are high in carbohydrates and most are low in fat. Therefore, they can be increased in the diet as substitutes for fatty foods. They too, however, contain calories and should not be eaten in excess. Cereals can be eaten for snacks as well as for breakfast.  Include foods that contain fiber (fruits, vegetables, whole grains, and legumes). Research shows that fiber may lower blood cholesterol levels, especially the water-soluble fiber found in fruits, vegetables, oat products, and legumes. FRUITS AND VEGETABLES It is good to eat  fruits and vegetables. Besides being sources of fiber, both are rich in vitamins and some minerals. They help you get the daily allowances of these nutrients. Fruits and vegetables can be used for snacks and desserts. MEATS Limit lean meat, chicken, [place], and fish to no more than 6 ounces per day. Beef, Pork, and Lamb Use lean cuts of beef, pork, and lamb. Lean cuts include:  Extra-lean ground beef.  Arm roast.  Sirloin tip.  Center-cut ham.  Round steak.  Loin chops.  Rump roast.  Tenderloin.  Trim all fat off the outside of meats before cooking. It is not necessary to severely decrease the intake of red meat, but lean choices should be made. Lean meat is rich in protein and contains a highly absorbable form of iron. Premenopausal women, in particular, should avoid reducing lean red meat because this could increase the risk for low red blood cells (iron-deficiency anemia).  Chicken and [place] These are good sources of protein. The fat of poultry can be reduced by removing the skin and underlying  fat layers before cooking. Chicken and [place] can be substituted for lean red meat in the diet. Poultry should not be fried or covered with high-fat sauces. Fish and Shellfish Fish is a good source of protein. Shellfish contain cholesterol, but they usually are low in saturated fatty acids. The preparation of fish is important. Like chicken and [place], they should not be fried or covered with high-fat sauces. EGGS Egg whites contain no fat or cholesterol. They can be eaten often. Try 1 to 2 egg whites instead of whole eggs in recipes or use egg substitutes that do not contain yolk.  MILK AND DAIRY PRODUCTS Use skim or 1% milk instead of 2% or whole milk. Decrease whole milk, natural, and processed cheeses. Use nonfat or low-fat (2%) cottage cheese or low-fat cheeses made from vegetable oils. Choose nonfat or low-fat (1 to 2%) yogurt. Experiment with evaporated skim milk in recipes that call for  heavy cream. Substitute low-fat yogurt or low-fat cottage cheese for sour cream in dips and salad dressings. Have at least 2 servings of low-fat dairy products, such as 2 glasses of skim (or 1%) milk each day to help get your daily calcium intake.  FATS AND OILS Butterfat, lard, and beef fats are high in saturated fat and cholesterol. These should be avoided.Vegetable fats do not contain cholesterol. AVOID coconut oil, palm oil, and palm kernel oil, WHICH are very high in saturated fats. These should be limited. These fats are often used in bakery goods, processed foods, popcorn, oils, and nondairy creamers. Vegetable shortenings and some peanut butters contain hydrogenated oils, which are also saturated fats. Read the labels on these foods and check for saturated vegetable oils.  Desirable liquid vegetable oils are corn oil, cottonseed oil, olive oil, canola oil, safflower oil, soybean oil, and sunflower oil. Peanut oil is not as good, but small amounts are acceptable. Buy a heart-healthy tub margarine that has no partially hydrogenated oils in the ingredients. AVOID Mayonnaise and salad dressings often are made from unsaturated fats.  OTHER EATING TIPS Snacks  Most sweets should be limited as snacks. They tend to be rich in calories and fats, and their caloric content outweighs their nutritional value. Some good choices in snacks are graham crackers, melba toast, soda crackers, bagels (no egg), English muffins, fruits, and vegetables. These snacks are preferable to snack crackers, [place] fries, and chips. Popcorn should be air-popped or cooked in small amounts of liquid vegetable oil.  Desserts Eat fruit, low-fat yogurt, and fruit ices instead of pastries, cake, and cookies. Sherbet, angel food cake, gelatin dessert, frozen low-fat yogurt, or other frozen products that do not contain saturated fat (pure fruit juice bars, frozen ice pops) are also acceptable.   COOKING METHODS Choose those methods  that use little or no fat. They include: Poaching.  Braising.  Steaming.  Grilling.  Baking.  Stir-frying.  Broiling.  Microwaving.  Foods can be cooked in a nonstick pan without added fat, or use a nonfat cooking spray in regular cookware. Limit fried foods and avoid frying in saturated fat. Add moisture to lean meats by using water, broth, cooking wines, and other nonfat or low-fat sauces along with the cooking methods mentioned above. Soups and stews should be chilled after cooking. The fat that forms on top after a few hours in the refrigerator should be skimmed off. When preparing meals, avoid using excess salt. Salt can contribute to raising blood pressure in some people.  EATING AWAY FROM HOME Order entres, potatoes, and  vegetables without sauces or butter. When meat exceeds the size of a deck of cards (3 to 4 ounces), the rest can be taken home for another meal. Choose vegetable or fruit salads and ask for low-calorie salad dressings to be served on the side. Use dressings sparingly. Limit high-fat toppings, such as bacon, crumbled eggs, cheese, sunflower seeds, and olives. Ask for heart-healthy tub margarine instead of butter.   Gastritis  Gastritis is an inflammation (the body's way of reacting to injury and/or infection) of the stomach. It is often caused by viral or bacterial (germ) infections. It can also be caused BY ASPIRIN, BC/GOODY POWDER'S, (IBUPROFEN) MOTRIN, OR ALEVE (NAPROXEN), chemicals (including alcohol), SPICY FOODS, and medications. This illness may be associated with generalized malaise (feeling tired, not well), UPPER ABDOMINAL STOMACH cramps, and fever. One common bacterial cause of gastritis is an organism known as H. Pylori. This can be treated with antibiotics.   Hiatal Hernia A hiatal hernia occurs when a part of the stomach slides above the diaphragm. The diaphragm is the thin muscle separating the belly (abdomen) from the chest. A hiatal hernia can be  something you are born with or develop over time. Hiatal hernias may allow stomach acid to flow back into your esophagus, the tube which carries food from your mouth to your stomach. If this acid causes problems it is called GERD (gastro-esophageal reflux disease).   Do not exercise right after eating.

## 2016-11-12 NOTE — Progress Notes (Signed)
Margaretha SeedsSamuel Enneking will be out of work Friday 11/12/16 and Saturday 11/13/16 and may return to work on Sunday 11/14/2016. Sondra BargesJ Makailey Hodgkin RN [facility]

## 2016-11-12 NOTE — Op Note (Signed)
[facility] Patient Name: Alexander Carroll Procedure Date: 11/12/2016 7:04 AM MRN: [id number] Date of Birth: 1966/01/06 Attending MD: Jonette Eva MD, MD CSN: [id number] Age: 51 Admit Type: Outpatient Procedure:                Upper GI endoscopy WITH ESOPHAGEAL DILATION/COLD                            FORCEPS BIOPSY Indications:              Dysphagia, Weight loss Providers:                Jonette Eva MD, MD, Nena Polio, RN, Dyann Ruddle Referring MD:             Corrie Mckusick MD, MD Medicines:                Meperidine 100 mg IV, Midazolam 6 mg IV Complications:            No immediate complications. Estimated Blood Loss:     Estimated blood loss was minimal. Procedure:                Pre-Anesthesia Assessment:                           - Prior to the procedure, a History and Physical                            was performed, and patient medications and                            allergies were reviewed. The patient's tolerance of                            previous anesthesia was also reviewed. The risks                            and benefits of the procedure and the sedation                            options and risks were discussed with the patient.                            All questions were answered, and informed consent                            was obtained. Prior Anticoagulants: The patient has                            taken no previous anticoagulant or antiplatelet                            agents. ASA Grade Assessment: II - A patient with                            mild systemic disease. After reviewing the risks  and benefits, the patient was deemed in                            satisfactory condition to undergo the procedure.                            After obtaining informed consent, the endoscope was                            passed under direct vision. Throughout the                            procedure, the patient's blood  pressure, pulse, and                            oxygen saturations were monitored continuously. The                            [id number] ([id number]) scope was introduced through the                            mouth, and advanced to the second part of duodenum.                            The upper GI endoscopy was accomplished without                            difficulty. The patient tolerated the procedure                            well. Scope In: 8:58:08 AM Scope Out: 9:09:31 AM Total Procedure Duration: 0 hours 11 minutes 23 seconds  Findings:      LA Grade B (one or more mucosal breaks greater than 5 mm, not extending       between the tops of two mucosal folds) esophagitis was found. Biopsies       were taken with a cold forceps for histology.      One mild (non-circumferential scarring) benign-appearing, intrinsic       stenosis was found. And was traversed. A guidewire was placed and the       scope was withdrawn. Dilation was performed with a Savary dilator with       mild resistance at 15 mm, 16 mm and 17 mm. Estimated blood loss was       minimal.      A medium-sized hiatal hernia was present.      Localized mild inflammation characterized by congestion (edema) and       erythema was found in the gastric antrum. Biopsies were taken with a       cold forceps for Helicobacter pylori testing.      The examined duodenum was normal. Impression:               - DYSPHAGIA DUE TO LA Grade B reflux esophagitis &                            Benign-appearing PEPTIC  STRICTURE, LESS LIKELY                            ACHALASIA                           - Medium-sized hiatal hernia.                           - MILD Gastritis. Moderate Sedation:      Moderate (conscious) sedation was administered by the endoscopy nurse       and supervised by the endoscopist. The following parameters were       monitored: oxygen saturation, heart rate, blood pressure, and response       to care. Total  physician intraservice time was 24 minutes. Recommendation:           - Await pathology results.                           - High fiber diet and low fat diet. LOSE WEIGHT.                           - Continue present medications. PROTONIX BID FOR 3                            MOS THEN ONCE DAILY.                           - Return to my office in 4 months.                           - Patient has a contact number available for                            emergencies. The signs and symptoms of potential                            delayed complications were discussed with the                            patient. Return to normal activities tomorrow.                            Written discharge instructions were provided to the                            patient. Procedure Code(s):        --- Professional ---                           [phone], Esophagogastroduodenoscopy, flexible,                            transoral; with insertion of guide wire followed by  passage of dilator(s) through esophagus over guide                            wire                           43239, Esophagogastroduodenoscopy, flexible,                            transoral; with biopsy, single or multiple                           99152, Moderate sedation services provided by the                            same physician or other qualified health care                            professional performing the diagnostic or                            therapeutic service that the sedation supports,                            requiring the presence of an independent trained                            observer to assist in the monitoring of the                            patient's level of consciousness and physiological                            status; initial 15 minutes of intraservice time,                            patient age 47 years or older                           [phone], Moderate sedation  services; each additional                            15 minutes intraservice time Diagnosis Code(s):        --- Professional ---                           K21.0, Gastro-esophageal reflux disease with                            esophagitis                           K22.2, Esophageal obstruction                           K44.9, Diaphragmatic hernia without obstruction or  gangrene                           K29.70, Gastritis, unspecified, without bleeding                           R13.10, Dysphagia, unspecified                           R63.4, Abnormal weight loss CPT copyright 2016 American Medical Association. All rights reserved. The codes documented in this report are preliminary and upon coder review may  be revised to meet current compliance requirements. Jonette Eva, MD Jonette Eva MD, MD 11/12/2016 9:20:22 AM This report has been signed electronically. Number of Addenda: 0

## 2016-11-12 NOTE — H&P (Addendum)
Primary Care Physician:  [name], John, MD Primary Gastroenterologist:  Dr. Darrick PennaFields  Pre-Procedure History & Physical: HPI:  Alexander Carroll is a 51 y.o. male here for DYSPHAGIA.  Past Medical History:  Diagnosis Date  . GERD (gastroesophageal reflux disease)   . Hiatal hernia   . Hiatal hernia     Past Surgical History:  Procedure Laterality Date  . NONE TO DATE      Prior to Admission medications   Medication Sig Start Date End Date Taking? Authorizing Provider  acetaminophen (TYLENOL) 500 MG tablet Take 500-1,000 mg by mouth every 6 (six) hours as needed (for headaches.).   Yes [provider]  pantoprazole (PROTONIX) 40 MG tablet Take 1 tablet (40 mg total) by mouth 2 (two) times daily before a meal. Patient taking differently: Take 40 mg by mouth daily before breakfast.  10/04/16  Yes Anice PaganiniGill, Eric A, NP  benzonatate (TESSALON PERLES) 100 MG capsule Take 2 capsules (200 mg total) by mouth 3 (three) times daily. Patient not taking: Reported on 11/04/2016 07/29/16   Catarina Hartshornat, David, MD    Allergies as of 10/04/2016 - Review Complete 10/04/2016  Allergen Reaction Noted  . Penicillins Hives 05/23/2013    Family History  Problem Relation Age of Onset  . Kidney failure Mother   . Leukemia Father   . Colon cancer Neg Hx   . Gastric cancer Neg Hx   . Esophageal cancer Neg Hx     Social History   Social History  . Marital status: Married    Spouse name: N/A  . Number of children: N/A  . Years of education: N/A   Occupational History  . Not on file.   Social History Main Topics  . Smoking status: Never Smoker  . Smokeless tobacco: Never Used  . Alcohol use Yes     Comment: beer once a week  . Drug use: No  . Sexual activity: Yes   Other Topics Concern  . Not on file   Social History Narrative  . No narrative on file    Review of Systems: See HPI, otherwise negative ROS   Physical Exam: BP (!) 134/93   Pulse 74   Temp (!) 97.4 F (36.3 C) (Oral)    Resp 14   Ht 6\' 1"  (1.854 m)   Wt 239 lb (108.4 kg)   SpO2 94%   BMI 31.53 kg/m  General:   Alert,  pleasant and cooperative in NAD Head:  Normocephalic and atraumatic. Neck:  Supple; Lungs:  Clear throughout to auscultation.    Heart:  Regular rate and rhythm. Abdomen:  Soft, nontender and nondistended. Normal bowel sounds, without guarding, and without rebound.   Neurologic:  Alert and  oriented x4;  grossly normal neurologically.  Impression/Plan:     DYSPHAGIA  PLAN:  EGD/DIL TODAY DISCUSSED PROCEDURE, BENEFITS, & RISKS: < 1% chance of medication reaction, OR bleeding.

## 2016-11-15 ENCOUNTER — Telehealth: Payer: Self-pay

## 2016-11-15 ENCOUNTER — Other Ambulatory Visit: Payer: Self-pay | Admitting: *Deleted

## 2016-11-15 DIAGNOSIS — R1310 Dysphagia, unspecified: Secondary | ICD-10-CM

## 2016-11-15 NOTE — Telephone Encounter (Signed)
[facility] and was advised they had to send a message to the referral coordinating as they are booked out until March 2019.

## 2016-11-15 NOTE — Telephone Encounter (Signed)
BPE scheduled for 11/19/16 at 10:00am w/ arrival time 9:45am. NPO 3 hrs prior to test. Called spoke with pt and is aware of appt details. He verbalized understanding.

## 2016-11-15 NOTE — Telephone Encounter (Signed)
Pt called and said he cannot keep foods down. He had procedure on Friday and has been unable to eat over week-end. He tried bacon, egg and cheese biscuit and also fish with baked potato. He is tolerating liquids, but not foods. Please advise!

## 2016-11-15 NOTE — Telephone Encounter (Signed)
PT is aware. Copy of full liquid diet at front for pick up. Routing to Mindy to schedule the BPE and make the referral.

## 2016-11-15 NOTE — Telephone Encounter (Signed)
PLEASE CALL PT. He should have a barium swallow(WITH PILL ESOPHAGRAM), Dx; DYSPHAGIA. CONTINUE ON A FULL LIQUID DIET. HE CAN PICK UP A HANDOUT AT THE FRONT DESK. HE NEEDS A REFERRAL TO [facility] FOR NPV, Dx: DYSPHAGIA AND NEED MANOMETRY AND IMPEDANCE ORDERED.

## 2016-11-19 ENCOUNTER — Ambulatory Visit (HOSPITAL_COMMUNITY)
Admission: RE | Admit: 2016-11-19 | Discharge: 2016-11-19 | Disposition: A | Discharge status: Home / Self Care | Payer: BLUE CROSS/BLUE SHIELD | Source: Ambulatory Visit | Attending: Gastroenterology | Admitting: Gastroenterology

## 2016-11-19 DIAGNOSIS — R1310 Dysphagia, unspecified: Secondary | ICD-10-CM | POA: Diagnosis present

## 2016-11-19 DIAGNOSIS — K449 Diaphragmatic hernia without obstruction or gangrene: Secondary | ICD-10-CM | POA: Diagnosis not present

## 2016-11-22 NOTE — Progress Notes (Signed)
Called and line busy

## 2016-11-23 ENCOUNTER — Telehealth: Payer: Self-pay | Admitting: Gastroenterology

## 2016-11-23 NOTE — Telephone Encounter (Signed)
See result note. Pt is aware.  

## 2016-11-23 NOTE — Telephone Encounter (Signed)
Pt was returning a call to DS. I told him that she was at lunch and would have to call them back. [phone]

## 2016-11-23 NOTE — Progress Notes (Signed)
LMOM to call.

## 2016-11-23 NOTE — Progress Notes (Signed)
PT is aware of results.  

## 2016-12-01 ENCOUNTER — Encounter (HOSPITAL_COMMUNITY): Payer: Self-pay | Admitting: Gastroenterology

## 2016-12-28 ENCOUNTER — Telehealth: Payer: Self-pay | Admitting: Gastroenterology

## 2016-12-28 NOTE — Telephone Encounter (Signed)
Pt called to see if we [facility] I told him that he has an OV with us on Thursday, but would like to speak to the nurse anyway. [phone]

## 2016-12-28 NOTE — Telephone Encounter (Signed)
I called and could not leave a Vm.

## 2016-12-29 NOTE — Telephone Encounter (Signed)
Pt has appt here on 12/29/2016.

## 2016-12-30 ENCOUNTER — Ambulatory Visit (INDEPENDENT_AMBULATORY_CARE_PROVIDER_SITE_OTHER): Payer: BLUE CROSS/BLUE SHIELD | Admitting: Nurse Practitioner

## 2016-12-30 ENCOUNTER — Encounter: Payer: Self-pay | Admitting: Nurse Practitioner

## 2016-12-30 VITALS — BP 124/84 | HR 81 | Temp 97.5°F | Ht 73.0 in | Wt 237.8 lb

## 2016-12-30 DIAGNOSIS — R1115 Cyclical vomiting syndrome unrelated to migraine: Secondary | ICD-10-CM

## 2016-12-30 DIAGNOSIS — R1310 Dysphagia, unspecified: Secondary | ICD-10-CM | POA: Diagnosis not present

## 2016-12-30 DIAGNOSIS — K219 Gastro-esophageal reflux disease without esophagitis: Secondary | ICD-10-CM

## 2016-12-30 DIAGNOSIS — G43A0 Cyclical vomiting, not intractable: Secondary | ICD-10-CM | POA: Diagnosis not present

## 2016-12-30 NOTE — Progress Notes (Signed)
Referring Provider: [name], John, MD Primary Care Physician:  [name], John, MD Primary GI:  Dr. Darrick PennaFields  Chief Complaint  Patient presents with  . Gastroesophageal Reflux    some; [facility]  . Dysphagia    "little but not much"    HPI:   Alexander SeedsSamuel Carroll is a 51 y.o. male who presents follow-up on GERD and vomiting.  The patient was last seen in our office 10/04/2016 for GERD, vomiting, esophageal dysphasia.  Chronic vomiting after eating for years but noted increased frequency at his last visit.  History of hiatal hernia on endoscopy.  His last visit he was doing well overall.  Last colonoscopy and EGD 3 years prior and equal GI, [place]  Vomiting worse in 3-4 months prior, some breakthrough GERD symptoms despite Protonix.  Vomiting happens 5-20 minutes after eating and typically occurs 3-4 times a week.  Some mid abdominal pain which improves after vomiting.  Some solid food dysphasia.  States his vomiting is "less visceral vomiting and more regurgitation."  Increase Protonix to twice a day, EGD, dysphagia 3 diet, follow-up in 3 months.  EGD was completed 11/12/2016 which found dysphasia due to LA grade B reflux esophagitis and benign-appearing peptic stricture, less likely achalasia.  Medium size hiatal hernia.  Mild gastritis.  Stomach biopsies were taken.  Surgical pathology found the biopsies to be chronic inactive gastritis without H. pylori.  Esophageal biopsies with inflamed and ulcerated mucosa negative for Candida.  Recommended weight loss, Protonix twice daily for 3 months then once daily, follow-up in 4 months.  The patient called 3 days later stating he was unable to keep down foods due to dysphagia.  Able to keep down liquids.  [facility] due to dysphasia and need of manometry and impedance.  Recommended barium pill esophagram.  Barium pill esophagram found small hiatal hernia otherwise normal study.  The patient appears to  have undergone manometry on 12/09/2016.  Results via attachment that is not available through care everywhere.  We will request results to be sent to us.  Today he states he's doing ok overall. After his manometry he "got sick" via cough and vomiting. He was seen and was told this is normal. Denies abdominal pain. GERD doing ok overall with breakthrough about once a month. Dysphagia is improved after EGD/dilation, occurs about once a month without regurgitation. Denies hematochezia, melena, N/V, abdominal pain. Has some chest pain with GERD symptoms. Denies dyspnea, dizziness, lightheadedness, syncope, near syncope. Denies any other upper or lower GI symptoms.  He is down 2 lbs intentionally in the past month. Feels teeth/dental issues are contributing to his dysphagia symptoms. He does have dental insurance.  Past Medical History:  Diagnosis Date  . GERD (gastroesophageal reflux disease)   . Hiatal hernia   . Hiatal hernia     Past Surgical History:  Procedure Laterality Date  . BIOPSY  11/12/2016   Procedure: BIOPSY;  Surgeon: West BaliFields, Sandi L, MD;  Location: [facility];  Service: Endoscopy;;  gastric and esophageal  . ESOPHAGOGASTRODUODENOSCOPY N/A 11/12/2016   Procedure: ESOPHAGOGASTRODUODENOSCOPY (EGD);  Surgeon: West BaliFields, Sandi L, MD;  Location: [facility];  Service: Endoscopy;  Laterality: N/A;  830   . NONE TO DATE    . SAVORY DILATION N/A 11/12/2016   Procedure: SAVORY DILATION;  Surgeon: West BaliFields, Sandi L, MD;  Location: [facility];  Service: Endoscopy;  Laterality: N/A;    Current Outpatient Medications  Medication Sig Dispense  Refill  . acetaminophen (TYLENOL) 500 MG tablet Take 500-1,000 mg by mouth every 6 (six) hours as needed (for headaches.).    Marland Kitchen. pantoprazole (PROTONIX) 40 MG tablet Take 1 tablet (40 mg total) by mouth 2 (two) times daily before a meal. (Patient taking differently: Take 40 mg by mouth daily before breakfast. ) 60 tablet 3   No current  facility-administered medications for this visit.     Allergies as of 12/30/2016 - Review Complete 12/30/2016  Allergen Reaction Noted  . Penicillins Hives 05/23/2013    Family History  Problem Relation Age of Onset  . Kidney failure Mother   . Leukemia Father   . Colon cancer Neg Hx   . Gastric cancer Neg Hx   . Esophageal cancer Neg Hx     Social History   Socioeconomic History  . Marital status: Married    Spouse name: None  . Number of children: None  . Years of education: None  . Highest education level: None  Social Needs  . Financial resource strain: None  . Food insecurity - worry: None  . Food insecurity - inability: None  . Transportation needs - medical: None  . Transportation needs - non-medical: None  Occupational History  . None  Tobacco Use  . Smoking status: Never Smoker  . Smokeless tobacco: Never Used  Substance and Sexual Activity  . Alcohol use: Yes    Comment: beer once a week  . Drug use: No  . Sexual activity: Yes  Other Topics Concern  . None  Social History Narrative  . None    Review of Systems: Complete ROS negative except as per HPI.   Physical Exam: BP 124/84   Pulse 81   Temp (!) 97.5 F (36.4 C) (Oral)   Ht 6\' 1"  (1.854 m)   Wt 237 lb 12.8 oz (107.9 kg)   BMI 31.37 kg/m  General:   Alert and oriented. Pleasant and cooperative. Well-nourished and well-developed.  Eyes:  Without icterus, sclera clear and conjunctiva pink.  Ears:  Normal auditory acuity. Cardiovascular:  S1, S2 present without murmurs appreciated. Extremities without clubbing or edema. Respiratory:  Clear to auscultation bilaterally. No wheezes, rales, or rhonchi. No distress.  Gastrointestinal:  +BS, soft, non-tender and non-distended. No HSM noted. No guarding or rebound. No masses appreciated.  Rectal:  Deferred  Musculoskalatal:  Symmetrical without gross deformities. Neurologic:  Alert and oriented x4;  grossly normal neurologically. Psych:  Alert  and cooperative. Normal mood and affect. Heme/Lymph/Immune: No excessive bruising noted.    12/30/2016 12:21 PM   Disclaimer: This note was dictated with voice recognition software. Similar sounding words can inadvertently be transcribed and may not be corrected upon review.

## 2016-12-30 NOTE — Assessment & Plan Note (Signed)
Symptoms resolved at this time.  Continue to monitor.  Follow-up in March 2019.

## 2016-12-30 NOTE — Patient Instructions (Addendum)
1. Continue to take your acid blocker twice a day until March 2019. 2. I am giving you further information related to foods to avoid for reflux as well as tips to soften foods to help with swallowing problems. 3. Continue weight loss efforts. 4. Return for follow-up in March 2019. 5. If you have any questions or concerns feel free to call us.        Food Choices for Gastroesophageal Reflux Disease, Adult When you have gastroesophageal reflux disease (GERD), the foods you eat and your eating habits are very important. Choosing the right foods can help ease the discomfort of GERD. Consider working with a diet and nutrition specialist (dietitian) to help you make healthy food choices. What general guidelines should I follow? Eating plan  Choose healthy foods low in fat, such as fruits, vegetables, whole grains, low-fat dairy products, and lean meat, fish, and poultry.  Eat frequent, small meals instead of three large meals each day. Eat your meals slowly, in a relaxed setting. Avoid bending over or lying down until 2-3 hours after eating.  Limit high-fat foods such as fatty meats or fried foods.  Limit your intake of oils, butter, and shortening to less than 8 teaspoons each day.  Avoid the following: ? Foods that cause symptoms. These may be different for different people. Keep a food diary to keep track of foods that cause symptoms. ? Alcohol. ? Drinking large amounts of liquid with meals. ? Eating meals during the 2-3 hours before bed.  Cook foods using methods other than frying. This may include baking, grilling, or broiling. Lifestyle   Maintain a healthy weight. Ask your health care provider what weight is healthy for you. If you need to lose weight, work with your health care provider to do so safely.  Exercise for at least 30 minutes on 5 or more days each week, or as told by your health care provider.  Avoid wearing clothes that fit tightly around your waist and  chest.  Do not use any products that contain nicotine or tobacco, such as cigarettes and e-cigarettes. If you need help quitting, ask your health care provider.  Sleep with the head of your bed raised. Use a wedge under the mattress or blocks under the bed frame to raise the head of the bed. What foods are not recommended? The items listed may not be a complete list. Talk with your dietitian about what dietary choices are best for you. Grains Pastries or quick breads with added fat. [place] toast. Vegetables Deep fried vegetables. [place] fries. Any vegetables prepared with added fat. Any vegetables that cause symptoms. For some people this may include tomatoes and tomato products, chili peppers, onions and garlic, and horseradish. Fruits Any fruits prepared with added fat. Any fruits that cause symptoms. For some people this may include citrus fruits, such as oranges, grapefruit, pineapple, and lemons. Meats and other protein foods High-fat meats, such as fatty beef or pork, hot dogs, ribs, ham, sausage, salami and bacon. Fried meat or protein, including fried fish and fried chicken. Nuts and nut butters. Dairy Whole milk and chocolate milk. Sour cream. Cream. Ice cream. Cream cheese. Milk shakes. Beverages Coffee and tea, with or without caffeine. Carbonated beverages. Sodas. Energy drinks. Fruit juice made with acidic fruits (such as orange or grapefruit). Tomato juice. Alcoholic drinks. Fats and oils Butter. Margarine. Shortening. Ghee. Sweets and desserts Chocolate and cocoa. Donuts. Seasoning and other foods Pepper. Peppermint and spearmint. Any condiments, herbs, or seasonings that cause  symptoms. For some people, this may include curry, hot sauce, or vinegar-based salad dressings. Summary  When you have gastroesophageal reflux disease (GERD), food and lifestyle choices are very important to help ease the discomfort of GERD.  Eat frequent, small meals instead of three large meals  each day. Eat your meals slowly, in a relaxed setting. Avoid bending over or lying down until 2-3 hours after eating.  Limit high-fat foods such as fatty meat or fried foods. This information is not intended to replace advice given to you by your health care provider. Make sure you discuss any questions you have with your health care provider. Document Released: 12/28/2004 Document Revised: 12/30/2015 Document Reviewed: 12/30/2015 Elsevier Interactive Patient Education  2018 [employer]    Dysphagia Diet Level 3, Mechanically Advanced The dysphagia level 3 diet includes foods that are soft, moist, and can be chopped into 1-inch chunks. This diet is helpful for people with mild swallowing difficulties. It reduces the risk of food getting caught in the windpipe, trachea, or lungs. What do I need to know about this diet?  You may eat foods that are soft and moist.  If you were on the dysphagia level 1 or level 2 diets, you may eat any of the foods included on those lists.  Avoid foods that are dry, hard, sticky, chewy, coarse, and crunchy. Also avoid large cuts of food.  Take small bites. Each bite should contain 1 inch or less of food.  Thicken liquids if instructed by your health care provider. Follow your health care provider's instructions on how to do this and to what consistency.  See your dietitian or speech language pathologist regularly for help with your dietary changes. What foods can I eat? Grains Moist breads without nuts or seeds. Biscuits, muffins, pancakes, and waffles well-moistened with syrup, jelly, margarine, or butter. Smooth cereals with plenty of milk to moisten them. Moist bread stuffing. Moist rice. Vegetables All cooked, soft vegetables. Shredded lettuce. Tender fried potatoes. Fruits All canned and cooked fruits. Soft, peeled fresh fruits, such as peaches, nectarines, kiwis, cantaloupe, honeydew melon, and watermelon without seeds. Soft berries, such as  strawberries. Meat and Other Protein Sources Moist ground or finely diced or sliced meats. Solid, tender cuts of meat. Meatloaf. Hamburger with a bun. Sausage patty. Deli thin-sliced lunch meat. Chicken, egg, or tuna salad sandwich. Sloppy joe. Moist fish. Eggs prepared any way. Casseroles with small chunks of meats, ground meats, or tender meats. Dairy Cheese spreads without coarse large chunks. Shredded cheese. Cheese slices. Cottage cheese. Milk at the right texture. Smooth frappes. Yogurt without nuts or coconut. Ask your health care provider whether you can have frozen desserts (such as malts or milk shakes) and thin liquids. Sweets/Desserts Soft, smooth, moist desserts. Non-chewy, smooth candy. Jam. Jelly. Honey. Preserves. Ask your health care provider whether you can have frozen desserts. Fats and Oils Butter. Oils. Margarine. Mayonnaise. Gravy. Spreads. Other All seasonings and sweeteners. All sauces without large chunks. The items listed above may not be a complete list of recommended foods or beverages. Contact your dietitian for more options. What foods are not recommended? Grains Coarse or dry cereals. Dry breads. Toast. Crackers. Tough, crusty breads, such French bread and baguettes. Tough, crisp fried potatoes. Potato skins. Dry bread stuffing. Granola. Popcorn. Chips. Vegetables All raw vegetables except shredded lettuce. Cooked corn. Rubbery or stiff cooked vegetables. Stringy vegetables, such as celery. Fruits Hard fruits that are difficult to chew, such as apples or pears. Stringy, high-pulp fruits, such  as pineapple, papaya, or mango. Fruits with tough skins, such as grapes. Coconut. All dried fruits. Fruit leather. Fruit roll-ups. Fruit snacks. Meat and Other Protein Sources Dry or tough meats or poultry. Dry fish. Fish with bones. Peanut butter. All nuts and seeds. Dairy Any with nuts, seeds, chocolate chips, dried fruit, coconut, or pineapple. Sweets/Desserts Dry cakes.  Chewy or dry cookies. Any with nuts, seeds, dry fruits, coconut, pineapple, or anything dry, sticky, or hard. Chewy caramel. Licorice. Taffy-type candies. Ask your health care provider whether you can have frozen desserts. Fats and Oils Any with chunks, nuts, seeds, or pineapple. Olives. Rosita Fire. Other Soups with tough or large chunks of meats, poultry, or vegetables. Corn or clam chowder. The items listed above may not be a complete list of foods and beverages to avoid. Contact your dietitian for more information. This information is not intended to replace advice given to you by your health care provider. Make sure you discuss any questions you have with your health care provider. Document Released: 12/28/2004 Document Revised: 06/05/2015 Document Reviewed: 12/11/2012 Elsevier Interactive Patient Education  [employer].

## 2016-12-30 NOTE — Progress Notes (Signed)
CC'D TO PCP °

## 2016-12-30 NOTE — Assessment & Plan Note (Signed)
GERD symptoms significantly improved on twice daily PPI.  Based on endoscopic recommendations we will keep him on twice a day PPI until March 2019.  At that point if his symptoms are continue to be well controlled we can decrease to once a day dosing.  Dysphasia was deemed likely due to LA grade B esophagitis.

## 2016-12-30 NOTE — Assessment & Plan Note (Signed)
States his symptoms have significantly improved.  He underwent manometry as requested.  Manometry and impedance were essentially normal other than very mild hypotension of the LES.  Recommend continue PPI twice daily.  Very rare dysphagia symptoms about once every 1-2 months.  He does have poor dental health and has dental insurance and plans on seeing a dentist to discuss GI what needs to be done to help him to better.  I will give him further instructions as far as GERD foods to avoid and dysphasia 3 diet for softening of foods.  If he is able to take care of his dental situation I think his dysphagia will improve significantly again.  Overall he is satisfied with results of dilation.  Follow-up in March 2019.

## 2017-02-17 ENCOUNTER — Emergency Department (HOSPITAL_COMMUNITY)
Admission: EM | Admit: 2017-02-17 | Discharge: 2017-02-17 | Disposition: A | Discharge status: Home / Self Care | Payer: BLUE CROSS/BLUE SHIELD | Attending: Emergency Medicine | Admitting: Emergency Medicine

## 2017-02-17 ENCOUNTER — Encounter (HOSPITAL_COMMUNITY): Payer: Self-pay | Admitting: Cardiology

## 2017-02-17 DIAGNOSIS — R509 Fever, unspecified: Secondary | ICD-10-CM | POA: Insufficient documentation

## 2017-02-17 DIAGNOSIS — Z79899 Other long term (current) drug therapy: Secondary | ICD-10-CM | POA: Insufficient documentation

## 2017-02-17 DIAGNOSIS — R51 Headache: Secondary | ICD-10-CM | POA: Insufficient documentation

## 2017-02-17 DIAGNOSIS — R000 Tachycardia, unspecified: Secondary | ICD-10-CM | POA: Insufficient documentation

## 2017-02-17 DIAGNOSIS — Z880 Allergy status to penicillin: Secondary | ICD-10-CM | POA: Insufficient documentation

## 2017-02-17 DIAGNOSIS — J0100 Acute maxillary sinusitis, unspecified: Secondary | ICD-10-CM | POA: Insufficient documentation

## 2017-02-17 MED ORDER — PROMETHAZINE HCL 12.5 MG PO TABS
12.5000 mg | ORAL_TABLET | Freq: Once | ORAL | Status: AC
  Administered 2017-02-17: 12.5 mg via ORAL
  Filled 2017-02-17: qty 1

## 2017-02-17 MED ORDER — IBUPROFEN 600 MG PO TABS: 600.0000 mg | ORAL_TABLET | Freq: Four times a day (QID) | ORAL | 0 refills | Status: DC

## 2017-02-17 MED ORDER — AZITHROMYCIN 250 MG PO TABS
500.0000 mg | ORAL_TABLET | Freq: Once | ORAL | Status: AC
  Administered 2017-02-17: 500 mg via ORAL
  Filled 2017-02-17: qty 2

## 2017-02-17 MED ORDER — DEXAMETHASONE 4 MG PO TABS: 4.0000 mg | ORAL_TABLET | Freq: Two times a day (BID) | ORAL | 0 refills | Status: DC

## 2017-02-17 MED ORDER — PSEUDOEPHEDRINE HCL 60 MG PO TABS
60.0000 mg | ORAL_TABLET | Freq: Once | ORAL | Status: AC
  Administered 2017-02-17: 60 mg via ORAL
  Filled 2017-02-17: qty 1

## 2017-02-17 MED ORDER — LORATADINE-PSEUDOEPHEDRINE ER 5-120 MG PO TB12: 1.0000 | ORAL_TABLET | Freq: Two times a day (BID) | ORAL | 0 refills | Status: DC

## 2017-02-17 MED ORDER — AZITHROMYCIN 250 MG PO TABS: ORAL_TABLET | ORAL | 0 refills | Status: DC

## 2017-02-17 MED ORDER — PREDNISONE 20 MG PO TABS
40.0000 mg | ORAL_TABLET | Freq: Once | ORAL | Status: AC
  Administered 2017-02-17: 40 mg via ORAL
  Filled 2017-02-17: qty 2

## 2017-02-17 NOTE — Discharge Instructions (Signed)
Please increase fluids.  Please use Claritin-D and Decadron 2 times daily.  Use ibuprofen every 6 hours as needed for pain or discomfort.  Use Zithromax daily with food. [facility] for additional evaluation if not improving.

## 2017-02-17 NOTE — ED Triage Notes (Signed)
Head and chest congestion since Monday.  C/o right sided facial pain since yesterday.

## 2017-02-17 NOTE — ED Provider Notes (Signed)
[facility] EMERGENCY DEPARTMENT Provider Note   CSN: [id number] Arrival date & time: 02/17/17  1309     History   Chief Complaint Chief Complaint  Patient presents with  . Nasal Congestion    HPI Margaretha SeedsSamuel Chaidez is a 52 y.o. male.  The history is provided by the patient.  URI   This is a new problem. The current episode started more than 2 days ago. The problem has been gradually worsening. Maximum temperature: subjective fever. Associated symptoms include congestion, headaches and sinus pain. Pertinent negatives include no chest pain, no abdominal pain, no dysuria, no neck pain, no cough and no wheezing. Associated symptoms comments: Chest congestion. Treatments tried: OTC medication. The treatment provided no relief.    Past Medical History:  Diagnosis Date  . GERD (gastroesophageal reflux disease)   . Hiatal hernia   . Hiatal hernia     Patient Active Problem List   Diagnosis Date Noted  . GERD (gastroesophageal reflux disease) 10/04/2016  . Dysphagia 10/04/2016  . Chest pain 07/28/2016  . Nausea and vomiting 07/28/2016    Past Surgical History:  Procedure Laterality Date  . BIOPSY  11/12/2016   Procedure: BIOPSY;  Surgeon: West BaliFields, Sandi L, MD;  Location: [facility];  Service: Endoscopy;;  gastric and esophageal  . ESOPHAGOGASTRODUODENOSCOPY N/A 11/12/2016   Procedure: ESOPHAGOGASTRODUODENOSCOPY (EGD);  Surgeon: West BaliFields, Sandi L, MD;  Location: [facility];  Service: Endoscopy;  Laterality: N/A;  830   . NONE TO DATE    . SAVORY DILATION N/A 11/12/2016   Procedure: SAVORY DILATION;  Surgeon: West BaliFields, Sandi L, MD;  Location: [facility];  Service: Endoscopy;  Laterality: N/A;       Home Medications    Prior to Admission medications   Medication Sig Start Date End Date Taking? Authorizing Provider  acetaminophen (TYLENOL) 500 MG tablet Take 500-1,000 mg by mouth every 6 (six) hours as needed (for headaches.).    [provider]  azithromycin  (ZITHROMAX) 250 MG tablet 1 po daily with food 02/17/17   Ivery QualeBryant, Cristin Szatkowski, PA-C  dexamethasone (DECADRON) 4 MG tablet Take 1 tablet (4 mg total) by mouth 2 (two) times daily with a meal. 02/17/17   Ivery QualeBryant, Thaniel Coluccio, PA-C  ibuprofen (ADVIL,MOTRIN) 600 MG tablet Take 1 tablet (600 mg total) by mouth 4 (four) times daily. 02/17/17   Ivery QualeBryant, Tennie Grussing, PA-C  loratadine-pseudoephedrine (CLARITIN-D 12 HOUR) 5-120 MG tablet Take 1 tablet by mouth 2 (two) times daily. 02/17/17   Ivery QualeBryant, Cashlyn Huguley, PA-C  pantoprazole (PROTONIX) 40 MG tablet Take 1 tablet (40 mg total) by mouth 2 (two) times daily before a meal. Patient taking differently: Take 40 mg by mouth daily before breakfast.  10/04/16   Anice PaganiniGill, Eric A, NP    Family History Family History  Problem Relation Age of Onset  . Kidney failure Mother   . Leukemia Father   . Colon cancer Neg Hx   . Gastric cancer Neg Hx   . Esophageal cancer Neg Hx     Social History Social History   Tobacco Use  . Smoking status: Never Smoker  . Smokeless tobacco: Never Used  Substance Use Topics  . Alcohol use: Yes    Comment: beer once a week  . Drug use: No     Allergies   Penicillins   Review of Systems Review of Systems  Constitutional: Positive for activity change and fever.       All ROS Neg except as noted in HPI  HENT: Positive  for congestion, sinus pressure and sinus pain. Negative for nosebleeds.   Eyes: Negative for photophobia and discharge.  Respiratory: Negative for cough, shortness of breath and wheezing.   Cardiovascular: Negative for chest pain and palpitations.  Gastrointestinal: Negative for abdominal pain and blood in stool.  Genitourinary: Negative for dysuria, frequency and hematuria.  Musculoskeletal: Negative for arthralgias, back pain and neck pain.  Skin: Negative.   Neurological: Positive for headaches. Negative for dizziness, seizures and speech difficulty.  Psychiatric/Behavioral: Negative for confusion and hallucinations.      Physical Exam Updated Vital Signs BP (!) 153/93   Pulse (!) 111   Temp 99.1 F (37.3 C) (Oral)   Resp 16   Ht 6\' 1"  (1.854 m)   Wt 109.8 kg (242 lb)   SpO2 97%   BMI 31.93 kg/m   Physical Exam  Constitutional: He is oriented to person, place, and time. He appears well-developed and well-nourished.  Non-toxic appearance.  HENT:  Head: Normocephalic.  Right Ear: Tympanic membrane and external ear normal.  Left Ear: Tympanic membrane and external ear normal.  Pain to percussion over the left maxillary sinus.  Nasal congestion present.  Bilateral ears are clear.  The oropharynx is clear there is no abscess appreciated.  Eyes: EOM and lids are normal. Pupils are equal, round, and reactive to light.  Neck: Normal range of motion. Neck supple. Carotid bruit is not present.  Cardiovascular: Regular rhythm, normal heart sounds, intact distal pulses and normal pulses. Tachycardia present.  Pulmonary/Chest: Breath sounds normal. No respiratory distress.  Abdominal: Soft. Bowel sounds are normal. There is no tenderness. There is no guarding.  Musculoskeletal: Normal range of motion.  Lymphadenopathy:       Head (right side): No submandibular adenopathy present.       Head (left side): No submandibular adenopathy present.    He has no cervical adenopathy.  Neurological: He is alert and oriented to person, place, and time. He has normal strength. No cranial nerve deficit or sensory deficit.  Skin: Skin is warm and dry.  Psychiatric: He has a normal mood and affect. His speech is normal.  Nursing note and vitals reviewed.    ED Treatments / Results  Labs (all labs ordered are listed, but only abnormal results are displayed) Labs Reviewed - No data to display  EKG  EKG Interpretation None       Radiology No results found.  Procedures Procedures (including critical care time)  Medications Ordered in ED Medications  pseudoephedrine (SUDAFED) tablet 60 mg (60 mg Oral  Given 02/17/17 1548)  predniSONE (DELTASONE) tablet 40 mg (40 mg Oral Given 02/17/17 1548)  promethazine (PHENERGAN) tablet 12.5 mg (12.5 mg Oral Given 02/17/17 1548)  azithromycin (ZITHROMAX) tablet 500 mg (500 mg Oral Given 02/17/17 1548)     Initial Impression / Assessment and Plan / ED Course  I have reviewed the triage vital signs and the nursing notes.  Pertinent labs & imaging results that were available during my care of the patient were reviewed by me and considered in my medical decision making (see chart for details).       Final Clinical Impressions(s) / ED Diagnoses MDM Patient is a 52 year old male who presents to the emergency department with sinus area pain and pressure.  No gross neurologic deficits appreciated on examination.  Gait and balance are intact.  There is pain to percussion over the sinuses.  Patient will be treated with Zithromax, Decadron, Claritin-D, and ibuprofen.  Patient is to see  the physicians at [facility], or return to the emergency department if any emergent changes.  Patient is in agreement with this plan.   Final diagnoses:  Acute maxillary sinusitis, recurrence not specified    ED Discharge Orders        Ordered    azithromycin (ZITHROMAX) 250 MG tablet     02/17/17 1547    dexamethasone (DECADRON) 4 MG tablet  2 times daily with meals     02/17/17 1547    loratadine-pseudoephedrine (CLARITIN-D 12 HOUR) 5-120 MG tablet  2 times daily     02/17/17 1547    ibuprofen (ADVIL,MOTRIN) 600 MG tablet  4 times daily     02/17/17 1547       Ivery Quale, PA-C 02/18/17 1610    Loren Racer, MD 02/18/17 1213

## 2017-03-30 ENCOUNTER — Ambulatory Visit: Payer: BLUE CROSS/BLUE SHIELD | Admitting: Nurse Practitioner

## 2017-03-30 ENCOUNTER — Telehealth: Payer: Self-pay

## 2017-03-30 DIAGNOSIS — R1115 Cyclical vomiting syndrome unrelated to migraine: Secondary | ICD-10-CM

## 2017-03-30 DIAGNOSIS — R1310 Dysphagia, unspecified: Secondary | ICD-10-CM

## 2017-03-30 DIAGNOSIS — K219 Gastro-esophageal reflux disease without esophagitis: Secondary | ICD-10-CM

## 2017-03-30 DIAGNOSIS — R1319 Other dysphagia: Secondary | ICD-10-CM

## 2017-03-30 NOTE — Telephone Encounter (Signed)
Pt said he has been having more episodes of vomiting. He had appt this AM and had to work late and he rescheduled.  He has not been taking the Pantoprazole and I told him he needs to get that and it should help him. I will have Wynne DustEric Gill, NP send new Rx to the pharmacy and he can try and if he still has problems he is to let us know.

## 2017-04-01 MED ORDER — PANTOPRAZOLE SODIUM 40 MG PO TBEC: 40.0000 mg | DELAYED_RELEASE_TABLET | Freq: Two times a day (BID) | ORAL | 3 refills | Status: DC

## 2017-04-01 NOTE — Telephone Encounter (Signed)
Rx sent. Let me know status of progress report.

## 2017-04-04 NOTE — Telephone Encounter (Signed)
LMOM to call.

## 2017-04-05 NOTE — Telephone Encounter (Signed)
Pt is aware and will let us know how it goes when he is on it for awhile.

## 2017-06-02 ENCOUNTER — Telehealth: Payer: Self-pay | Admitting: *Deleted

## 2017-06-02 ENCOUNTER — Ambulatory Visit (INDEPENDENT_AMBULATORY_CARE_PROVIDER_SITE_OTHER): Payer: Managed Care, Other (non HMO) | Admitting: Nurse Practitioner

## 2017-06-02 ENCOUNTER — Encounter: Payer: Self-pay | Admitting: Nurse Practitioner

## 2017-06-02 VITALS — BP 122/80 | HR 72 | Temp 97.7°F | Ht 74.0 in | Wt 251.6 lb

## 2017-06-02 DIAGNOSIS — R112 Nausea with vomiting, unspecified: Secondary | ICD-10-CM

## 2017-06-02 DIAGNOSIS — R1115 Cyclical vomiting syndrome unrelated to migraine: Secondary | ICD-10-CM

## 2017-06-02 DIAGNOSIS — G43A0 Cyclical vomiting, not intractable: Secondary | ICD-10-CM | POA: Diagnosis not present

## 2017-06-02 DIAGNOSIS — R6881 Early satiety: Secondary | ICD-10-CM

## 2017-06-02 DIAGNOSIS — K219 Gastro-esophageal reflux disease without esophagitis: Secondary | ICD-10-CM | POA: Diagnosis not present

## 2017-06-02 MED ORDER — ONDANSETRON HCL 4 MG PO TABS: 4.0000 mg | ORAL_TABLET | Freq: Three times a day (TID) | ORAL | 1 refills | Status: DC | PRN

## 2017-06-02 NOTE — Assessment & Plan Note (Signed)
GERD seems to be moderately well controlled on twice daily PPI.  He has breakthrough symptoms about once a week to every other week.  Continue PPI.  Return for follow-up in 3 months.

## 2017-06-02 NOTE — Assessment & Plan Note (Signed)
Persistent nausea and vomiting about 1-2 times a week.  At this point I will send in Zofran to the pharmacy to help with his symptomatic management.  He does have early satiety and query an element of gastroparesis.  I will check a gastric emptying study to evaluate for this.  Return for follow-up in 3 months.

## 2017-06-02 NOTE — Progress Notes (Signed)
CC'D TO PCP °

## 2017-06-02 NOTE — Telephone Encounter (Signed)
GES scheduled for 06/09/17 at 8:00am, nothing after midnight and no stomach medications.   I called patient insurance [employer] and was advised it shows patient is inactive as of 01/11/17. I called patient and he states he has been having problems with the insurance and his wife is suppose to call them this week to see what needs to be done. Patient aware to let me know if he gets his insurance straighten out.

## 2017-06-02 NOTE — Patient Instructions (Signed)
1. We will schedule your stomach emptying test for you. 2. I have sent in Zofran to your pharmacy.  You can use this up to every 6 hours as needed for nausea and vomiting. 3. Return for follow-up in 3 months. 4. Call us if you have any questions or concerns.  At [facility] we value your feedback. You may receive a survey about your visit today. Please share your experience as we strive to create trusting relationships with our patients to provide genuine, compassionate, quality care.  It was good to see you today!  I hope you have a great summer!!

## 2017-06-02 NOTE — Progress Notes (Signed)
Referring Provider: Assunta Found, MD Primary Care Physician:  [facility] Primary GI:  Dr. Darrick Penna  Chief Complaint  Patient presents with  . Gastroesophageal Reflux    f/u. Still vomiting 5-10 min after eating  . Dysphagia    unchanged    HPI:   Alexander Carroll is a 52 y.o. male who presents for follow-up on GERD and dysphasia.  The patient was last seen in our office 12/30/2016 for the same.  Chronic postprandial vomiting for years, history of hiatal hernia.  Most recent EGD 11/12/2016 with dysphasia due to LA grade B reflux esophagitis and benign-appearing peptic stricture, less likely achalasia.  Medium sized hiatal hernia, mild gastritis status post biopsies found to be chronic inactive gastritis without H. pylori.  Esophageal biopsies with inflamed and ulcerated mucosa negative for Candida.  Recommended weight loss and Protonix twice daily for 3 months and once daily thereafter.  Post procedure he complained of dysphagia and recommended referral to [facility] for manometry and impedance.  Also recommend barium pill esophagram which was completed and found to small hiatal hernia otherwise normal.  Underwent manometry 12/09/2016.  At his last visit he was doing well overall, GERD doing well overall with breakthrough about once a month.  Dysphasia is improved after EGD/dilation but still occurs about once a month without regurgitation.  No other GI symptoms.  Is making an effort to lose weight and is down 2 pounds intentionally.  Feels dental/teeth issues are contributing to his dysphagia.  Commended continue acid blocker twice a day until March then decrease to once a day.  Patient education provided, continue weight loss efforts, follow-up in March 2019.  Patient called after having to reschedule his appointment due to work issues indicating he was having more vomiting.  He was not taking Protonix as previously recommended.  He was recommended to restart Protonix and a new  prescription was sent to his pharmacy.  No further feedback from the patient despite request for progress report.  Today he states he's still having symptoms. Monometry revealed weak LES otherwise normal esophagus.  He is doing generally okay with some breakthrough about 1-2 times a week versus every other week.  He is on twice daily PPI.  He does still have vomiting about 1-2 times a week postprandially.  This is not associated with GERD symptoms.  He does have more good days and bad days.  He also complains of early satiety.  Denies abdominal pain, hematochezia, melena, fever, chills, unintentional weight loss.  No other GI complaints.  Past Medical History:  Diagnosis Date  . GERD (gastroesophageal reflux disease)   . Hiatal hernia   . Hiatal hernia     Past Surgical History:  Procedure Laterality Date  . BIOPSY  11/12/2016   Procedure: BIOPSY;  Surgeon: West Bali, MD;  Location: [facility];  Service: Endoscopy;;  gastric and esophageal  . ESOPHAGOGASTRODUODENOSCOPY N/A 11/12/2016   Procedure: ESOPHAGOGASTRODUODENOSCOPY (EGD);  Surgeon: West Bali, MD;  Location: [facility];  Service: Endoscopy;  Laterality: N/A;  830   . NONE TO DATE    . SAVORY DILATION N/A 11/12/2016   Procedure: SAVORY DILATION;  Surgeon: West Bali, MD;  Location: [facility];  Service: Endoscopy;  Laterality: N/A;    Current Outpatient Medications  Medication Sig Dispense Refill  . acetaminophen (TYLENOL) 500 MG tablet Take 500-1,000 mg by mouth every 6 (six) hours as needed (for headaches.).    Marland Kitchen loratadine-pseudoephedrine (CLARITIN-D 12  HOUR) 5-120 MG tablet Take 1 tablet by mouth 2 (two) times daily. (Patient taking differently: Take 1 tablet by mouth as needed. ) 20 tablet 0  . pantoprazole (PROTONIX) 40 MG tablet Take 1 tablet (40 mg total) by mouth 2 (two) times daily before a meal. 60 tablet 3   No current facility-administered medications for this visit.     Allergies as of  06/02/2017 - Review Complete 06/02/2017  Allergen Reaction Noted  . Penicillins Hives 05/23/2013    Family History  Problem Relation Age of Onset  . Kidney failure Mother   . Leukemia Father   . Colon cancer Neg Hx   . Gastric cancer Neg Hx   . Esophageal cancer Neg Hx     Social History   Socioeconomic History  . Marital status: Married    Spouse name: Not on file  . Number of children: Not on file  . Years of education: Not on file  . Highest education level: Not on file  Occupational History  . Not on file  Social Needs  . Financial resource strain: Not on file  . Food insecurity:    Worry: Not on file    Inability: Not on file  . Transportation needs:    Medical: Not on file    Non-medical: Not on file  Tobacco Use  . Smoking status: Never Smoker  . Smokeless tobacco: Never Used  Substance and Sexual Activity  . Alcohol use: Yes    Comment: beer once a week  . Drug use: No  . Sexual activity: Yes  Lifestyle  . Physical activity:    Days per week: Not on file    Minutes per session: Not on file  . Stress: Not on file  Relationships  . Social connections:    Talks on phone: Not on file    Gets together: Not on file    Attends religious service: Not on file    Active member of club or organization: Not on file    Attends meetings of clubs or organizations: Not on file    Relationship status: Not on file  Other Topics Concern  . Not on file  Social History Narrative  . Not on file    Review of Systems: Complete ROS negative except as per HPI.   Physical Exam: BP 122/80   Pulse 72   Temp 97.7 F (36.5 C) (Oral)   Ht  (1.88 m)   Wt 251 lb 9.6 oz (114.1 kg)   BMI 32.30 kg/m  General:   Alert and oriented. Pleasant and cooperative. Well-nourished and well-developed.  Eyes:  Without icterus, sclera clear and conjunctiva pink.  Ears:  Normal auditory acuity. Cardiovascular:  S1, S2 present without murmurs appreciated.  Respiratory:  Clear to  auscultation bilaterally. No wheezes, rales, or rhonchi. No distress.  Gastrointestinal:  +BS, soft, non-tender and non-distended. No HSM noted. No guarding or rebound. No masses appreciated.  Rectal:  Deferred  Musculoskalatal:  Symmetrical without gross deformities. Neurologic:  Alert and oriented x4;  grossly normal neurologically. Psych:  Alert and cooperative. Normal mood and affect. Heme/Lymph/Immune: No excessive bruising noted.    06/02/2017 11:47 AM   Disclaimer: This note was dictated with voice recognition software. Similar sounding words can inadvertently be transcribed and may not be corrected upon review.

## 2017-06-09 ENCOUNTER — Encounter (HOSPITAL_COMMUNITY): Payer: Self-pay

## 2017-06-09 ENCOUNTER — Ambulatory Visit (HOSPITAL_COMMUNITY)
Admission: RE | Admit: 2017-06-09 | Discharge: 2017-06-09 | Disposition: A | Discharge status: Home / Self Care | Payer: Managed Care, Other (non HMO) | Source: Ambulatory Visit | Attending: Nurse Practitioner | Admitting: Nurse Practitioner

## 2017-06-09 DIAGNOSIS — R6881 Early satiety: Secondary | ICD-10-CM | POA: Diagnosis not present

## 2017-06-09 DIAGNOSIS — R1115 Cyclical vomiting syndrome unrelated to migraine: Secondary | ICD-10-CM

## 2017-06-09 DIAGNOSIS — R112 Nausea with vomiting, unspecified: Secondary | ICD-10-CM | POA: Diagnosis not present

## 2017-06-09 DIAGNOSIS — K219 Gastro-esophageal reflux disease without esophagitis: Secondary | ICD-10-CM | POA: Insufficient documentation

## 2017-06-09 MED ORDER — TECHNETIUM TC 99M SULFUR COLLOID
2.0000 | Freq: Once | INTRAVENOUS | Status: AC | PRN
  Administered 2017-06-09: 1.98 via ORAL

## 2017-06-20 ENCOUNTER — Emergency Department (HOSPITAL_COMMUNITY): Payer: Managed Care, Other (non HMO)

## 2017-06-20 ENCOUNTER — Emergency Department (HOSPITAL_COMMUNITY)
Admission: EM | Admit: 2017-06-20 | Discharge: 2017-06-20 | Disposition: A | Discharge status: Home / Self Care | Payer: Managed Care, Other (non HMO) | Attending: Emergency Medicine | Admitting: Emergency Medicine

## 2017-06-20 ENCOUNTER — Other Ambulatory Visit: Payer: Self-pay

## 2017-06-20 DIAGNOSIS — R0789 Other chest pain: Secondary | ICD-10-CM | POA: Diagnosis not present

## 2017-06-20 DIAGNOSIS — R1013 Epigastric pain: Secondary | ICD-10-CM

## 2017-06-20 DIAGNOSIS — Z79899 Other long term (current) drug therapy: Secondary | ICD-10-CM | POA: Insufficient documentation

## 2017-06-20 LAB — HEPATIC FUNCTION PANEL
ALBUMIN: 4.2 g/dL (ref 3.5–5.0)
ALK PHOS: 78 U/L (ref 38–126)
ALT: 35 U/L (ref 17–63)
AST: 24 U/L (ref 15–41)
Bilirubin, Direct: 0.2 mg/dL (ref 0.1–0.5)
Indirect Bilirubin: 0.9 mg/dL (ref 0.3–0.9)
TOTAL PROTEIN: 7.2 g/dL (ref 6.5–8.1)
Total Bilirubin: 1.1 mg/dL (ref 0.3–1.2)

## 2017-06-20 LAB — BASIC METABOLIC PANEL
ANION GAP: 9 (ref 5–15)
BUN: 18 mg/dL (ref 6–20)
CHLORIDE: 109 mmol/L (ref 101–111)
CO2: 23 mmol/L (ref 22–32)
Calcium: 8.9 mg/dL (ref 8.9–10.3)
Creatinine, Ser: 0.96 mg/dL (ref 0.61–1.24)
GFR calc Af Amer: >60 mL/min (ref >60)
GFR calc non Af Amer: >60 mL/min (ref >60)
GLUCOSE: 104 mg/dL — AB (ref 65–99)
POTASSIUM: 4.1 mmol/L (ref 3.5–5.1)
Sodium: 141 mmol/L (ref 135–145)

## 2017-06-20 LAB — CBC
HEMATOCRIT: 44.7 % (ref 39.0–52.0)
HEMOGLOBIN: 15.9 g/dL (ref 13.0–17.0)
MCH: 30.6 pg (ref 26.0–34.0)
MCHC: 35.6 g/dL (ref 30.0–36.0)
MCV: 86 fL (ref 78.0–100.0)
Platelets: 178 10*3/uL (ref 150–400)
RBC: 5.2 MIL/uL (ref 4.22–5.81)
RDW: 13.1 % (ref 11.5–15.5)
WBC: 6.4 10*3/uL (ref 4.0–10.5)

## 2017-06-20 LAB — I-STAT TROPONIN, ED
TROPONIN I, POC: 0.01 ng/mL (ref 0.00–0.08)
Troponin i, poc: 0 ng/mL (ref 0.00–0.08)

## 2017-06-20 LAB — D-DIMER, QUANTITATIVE: D-Dimer, Quant: <0.27 ug/mL-FEU (ref 0.00–0.50)

## 2017-06-20 LAB — LIPASE, BLOOD: Lipase: 30 U/L (ref 11–51)

## 2017-06-20 MED ORDER — ONDANSETRON 4 MG PO TBDP
4.0000 mg | ORAL_TABLET | Freq: Once | ORAL | Status: AC
  Administered 2017-06-20: 4 mg via ORAL
  Filled 2017-06-20: qty 1

## 2017-06-20 MED ORDER — MORPHINE SULFATE (PF) 4 MG/ML IV SOLN
4.0000 mg | Freq: Once | INTRAVENOUS | Status: AC
  Administered 2017-06-20: 4 mg via INTRAVENOUS
  Filled 2017-06-20: qty 1

## 2017-06-20 MED ORDER — FAMOTIDINE IN NACL 20-0.9 MG/50ML-% IV SOLN
20.0000 mg | Freq: Once | INTRAVENOUS | Status: AC
  Administered 2017-06-20: 20 mg via INTRAVENOUS
  Filled 2017-06-20: qty 50

## 2017-06-20 MED ORDER — GI COCKTAIL ~~LOC~~
30.0000 mL | Freq: Once | ORAL | Status: AC
  Administered 2017-06-20: 30 mL via ORAL
  Filled 2017-06-20: qty 30

## 2017-06-20 MED ORDER — SUCRALFATE 1 GM/10ML PO SUSP: 1.0000 g | Freq: Three times a day (TID) | ORAL | 0 refills | Status: DC

## 2017-06-20 NOTE — ED Triage Notes (Signed)
Patient c/o right chest pain and vomiting since last night. Patient denies any SOB. Patient reports a history of GERD.

## 2017-06-20 NOTE — Discharge Instructions (Addendum)
Work-up today in the ER was reassuring.  Ultrasound of your gallbladder did not show any abnormalities.  We suspect her symptoms are from GI or reflux.  Continue taking all your medications as prescribed by gastroenterology.  Start taking Carafate suspension 4 times a day with meals and before bedtime.  Try soft and smaller meals throughout the day, avoid greasy, fatty, spicy foods as this may be worsening your symptoms. Eating softer foods may help with digestion.   We recommend follow-up with cardiology within the next 30 days for further evaluation of your intermittent chest pain.  Return to the ER for  Chest pain or shortness of breath with exertion, palpitations, lightheadedness, fever, cough, calf pain or swelling.`

## 2017-06-20 NOTE — ED Provider Notes (Signed)
[facility]-EMERGENCY DEPT Provider Note   CSN: [id number] Arrival date & time: 06/20/17  1322     History   Chief Complaint Chief Complaint  Patient presents with  . Chest Pain    HPI Alexander Carroll is a 52 y.o. male with history of GERD and dysphasia, chronic postpandrial nausea and vomiting is here for evaluation of sudden onset, intermittent chest pain.  Right-sided, substernal, nonradiating.  Onset yesterday evening while laying down.  Chest pain intermittent lasting 4 to 5 minutes and spontaneously going away.  Worse when laying down on the right side, belching, repositioning or movements, taking deep breaths and coughing.  Also noticed it while walking into the ER but also occurring at rest as well.  Associated symptoms include slight nonproductive cough which is chronic.  He has long history of postprandial nausea and vomiting and GERD that is unchanged.  Has been compliant with his GERD medications. He denies lightheadedness, diaphoresis, shortness of breath, palpitations, lower extremity swelling, calf pain.  Chest pain slightly alleviated with Tylenol.  HPI  Past Medical History:  Diagnosis Date  . GERD (gastroesophageal reflux disease)   . Hiatal hernia   . Hiatal hernia     Patient Active Problem List   Diagnosis Date Noted  . GERD (gastroesophageal reflux disease) 10/04/2016  . Dysphagia 10/04/2016  . Chest pain 07/28/2016  . Nausea and vomiting 07/28/2016    Past Surgical History:  Procedure Laterality Date  . BIOPSY  11/12/2016   Procedure: BIOPSY;  Surgeon: West Bali, MD;  Location: [facility];  Service: Endoscopy;;  gastric and esophageal  . ESOPHAGOGASTRODUODENOSCOPY N/A 11/12/2016   Procedure: ESOPHAGOGASTRODUODENOSCOPY (EGD);  Surgeon: West Bali, MD;  Location: [facility];  Service: Endoscopy;  Laterality: N/A;  830   . NONE TO DATE    . SAVORY DILATION N/A 11/12/2016   Procedure: SAVORY DILATION;  Surgeon: West Bali, MD;  Location: [facility];  Service: Endoscopy;  Laterality: N/A;        Home Medications    Prior to Admission medications   Medication Sig Start Date End Date Taking? Authorizing Provider  acetaminophen (TYLENOL) 500 MG tablet Take 500-1,000 mg by mouth every 6 (six) hours as needed for mild pain or moderate pain (for headaches.).    Yes [provider]  loratadine-pseudoephedrine (CLARITIN-D 12 HOUR) 5-120 MG tablet Take 1 tablet by mouth 2 (two) times daily. Patient taking differently: Take 1 tablet by mouth 3 (three) times daily.  02/17/17  Yes Ivery Quale, PA-C  ondansetron (ZOFRAN) 4 MG tablet Take 1 tablet (4 mg total) by mouth 3 (three) times daily with meals as needed for nausea or vomiting. 06/02/17  Yes Anice Paganini, NP  pantoprazole (PROTONIX) 40 MG tablet Take 1 tablet (40 mg total) by mouth 2 (two) times daily before a meal. 04/01/17  Yes Anice Paganini, NP  sucralfate (CARAFATE) 1 GM/10ML suspension Take 10 mLs (1 g total) by mouth 4 (four) times daily -  with meals and at bedtime. 06/20/17   Liberty Handy, PA-C    Family History Family History  Problem Relation Age of Onset  . Kidney failure Mother   . Leukemia Father   . Colon cancer Neg Hx   . Gastric cancer Neg Hx   . Esophageal cancer Neg Hx     Social History Social History   Tobacco Use  . Smoking status: Never Smoker  . Smokeless tobacco: Never Used  Substance Use Topics  . Alcohol use: Yes    Comment: beer once a week  . Drug use: No     Allergies   Penicillins   Review of Systems Review of Systems  Respiratory: Positive for cough.   Cardiovascular: Positive for chest pain.  Gastrointestinal: Positive for nausea (chronic) and vomiting (chronic).     Physical Exam Updated Vital Signs BP 107/67 (BP Location: Right Arm)   Pulse 63   Temp 98 F (36.7 C) (Oral)   Resp 16   Ht 6\' 2"  (1.88 m)   Wt 113.4 kg (250 lb)   SpO2 94%   BMI 32.10 kg/m   Physical Exam    Constitutional: He appears well-developed and well-nourished.  NAD. Non toxic.   HENT:  Head: Normocephalic and atraumatic.  Nose: Nose normal.  Moist mucous membranes. Tonsils and oropharynx normal  Eyes: Conjunctivae, EOM and lids are normal.  Neck: Trachea normal and normal range of motion.  Trachea midline. No cervical adenopathy  Cardiovascular: Normal rate, regular rhythm, S1 normal, S2 normal and normal heart sounds.  Pulses:      Carotid pulses are 2+ on the right side, and 2+ on the left side.      Radial pulses are 2+ on the right side, and 2+ on the left side.       Dorsalis pedis pulses are 2+ on the right side, and 2+ on the left side.    No LE edema or calf tenderness.   Pulmonary/Chest: Effort normal and breath sounds normal. No respiratory distress. He exhibits tenderness.  Reproducible R sided anterior/sternal chest wall and epigastrium tenderness. Pain reproduced with deep inspiration, sitting up and active ROM of upper extremities with resistance.   Abdominal: Soft. Bowel sounds are normal. There is tenderness in the right upper quadrant and epigastric area.  TTP to RUQ and epigastrium. Positive Murphy's. No distention   Neurological: He is alert. GCS eye subscore is 4. GCS verbal subscore is 5. GCS motor subscore is 6.  Skin: Skin is warm and dry. Capillary refill takes less than 2 seconds.  No rash to chest wall  Psychiatric: He has a normal mood and affect. His speech is normal and behavior is normal. Judgment and thought content normal. Cognition and memory are normal.     ED Treatments / Results  Labs (all labs ordered are listed, but only abnormal results are displayed) Labs Reviewed  BASIC METABOLIC PANEL - Abnormal; Notable for the following components:      Result Value   Glucose, Bld 104 (*)    All other components within normal limits  CBC  D-DIMER, QUANTITATIVE (NOT AT [facility])  HEPATIC FUNCTION PANEL  LIPASE, BLOOD  I-STAT TROPONIN, ED  I-STAT  TROPONIN, ED    EKG EKG Interpretation  Date/Time:  Monday June 20 2017 15:42:00 EDT Ventricular Rate:  73 PR Interval:    QRS Duration: 101 QT Interval:  399 QTC Calculation: 440 R Axis:   1 Text Interpretation:  Sinus rhythm Confirmed by Bethann Berkshire [phone]) on 06/20/2017 10:10:38 PM   Radiology Dg Chest 2 View  Result Date: 06/20/2017 CLINICAL DATA:  RIGHT chest pain and vomiting beginning last night EXAM: CHEST - 2 VIEW COMPARISON:  Chest radiograph July 28, 2017 FINDINGS: Cardiac silhouette is similarly enlarged. Similar interstitial prominence without pleural effusion or focal consolidation. No pneumothorax. Old RIGHT rib fractures. Soft tissue planes non suspicious. IMPRESSION: Mild cardiomegaly. Stable interstitial prominence without focal consolidation Electronically Signed   By:  Awilda Metroourtnay  Bloomer M.D.   On: 06/20/2017 14:37   [place] Abdomen Limited Ruq  Result Date: 06/20/2017 CLINICAL DATA:  Shortness of breath. Nausea and vomiting for 1 year. EXAM: ULTRASOUND ABDOMEN LIMITED RIGHT UPPER QUADRANT COMPARISON:  None. FINDINGS: Gallbladder: Limited visualization due to body habitus.  No abnormalities noted. Common bile duct: Diameter: 6 mm Liver: Probable hepatic steatosis. Portal vein is patent on color Doppler imaging with normal direction of blood flow towards the liver. IMPRESSION: 1. The study is limited due to patient body habitus. Within these limitations, no cause for the patient's symptoms identified. Electronically Signed   By: Gerome Samavid  Williams III M.D   On: 06/20/2017 21:41    Procedures Procedures (including critical care time)  Medications Ordered in ED Medications  gi cocktail (Maalox,Lidocaine,Donnatal) (30 mLs Oral Given 06/20/17 1545)  morphine 4 MG/ML injection 4 mg (4 mg Intravenous Given 06/20/17 2025)  famotidine (PEPCID) IVPB 20 mg premix (0 mg Intravenous Stopped 06/20/17 2057)  ondansetron (ZOFRAN-ODT) disintegrating tablet 4 mg (4 mg Oral Given 06/20/17  2009)     Initial Impression / Assessment and Plan / ED Course  I have reviewed the triage vital signs and the nursing notes.  Pertinent labs & imaging results that were available during my care of the patient were reviewed by me and considered in my medical decision making (see chart for details).  Clinical Course as of Jun 21 2310  Mon Jun 20, 2017  1945 Re-evaluated pt after he ate crackers and    [CG]    Clinical Course User Index [CG] Liberty HandyGibbons, Ekin Pilar J, PA-C   Ddx includes GI vs MSK vs ACS vs PE vs gall bladder. Lower suspicion for perforated ulcer, dissection, AAA, pancreatitis as his abd pain is mild. I have reviewed patient's chart, he was admitted for CP rule out 6 months ago and had negative troponins and echo, suspected GI vs MSK related at that time. He has long h/o GERD.  He has had EGD, esophageal biopsies, manometry, dilation, and gastric emptying studies for this showed mild esophagitis, gastritis, benign appearing peptic stricture, hiatal hernia and weak LES. He has been compliant with GERD meds and postprandial n/v and GERD unchanged recently. Will obtain screening labs, provide GI cocktail and reassess.   2307: Labs, imaging reviewed and very reassuring.  CBC, LFTs, BMP, lipase, d-dimer, EKG, CXR WNL.  Troponin 0.00 > 0.001.  Right upper quadrant ultrasound limited due to body habitus however no obvious abnormalities.  While in ER patient had crackers/fluid orally which exacerbated his pain.  His pain continued to intermittently returned and he had 2 normal EKGs.  Symptoms were addressed with morphine, Zofran, GI cocktail, Pepcid which alleviated the discomfort. Pt has no established cardiology f/u, however had a normal echo 6 months ago and low HEART score = 2. Given reassuring exam, low heart score, feel pt is adequate for discharge. Favoring GI vs MSK etiology. Will dc with f/u with cardiology within 30 day for re-evaluation. Discussed strict return precautions. Pt shared  and evaluated by Dr Estell HarpinZammit who assisted with MDM.  Final Clinical Impressions(s) / ED Diagnoses   Final diagnoses:  Dyspepsia  Atypical chest pain    ED Discharge Orders        Ordered    sucralfate (CARAFATE) 1 GM/10ML suspension  3 times daily with meals & bedtime     06/20/17 2235       Liberty HandyGibbons, Doneen Ollinger J, PA-C 06/20/17 2312    Bethann BerkshireZammit, Joseph, MD 06/21/17 1116

## 2017-06-28 NOTE — Progress Notes (Signed)
LMOM to call.

## 2017-06-28 NOTE — Progress Notes (Signed)
Pt is aware of normal Gastric emptying test. [name] he had an episode last Sunday night/Monday and had to go to the ED, thought he was having a heart attack.  He was checked out and they said it was his reflux. He was given Carafate and is taking the pills. He is taking the Protonix bid. [name] he is doing some better. They referred him to cardiologist and told him to follow up here. He has appt 09/08/2017. Dr. Darrick PennaFields, please advise if pt should be seen earlier.

## 2017-06-29 NOTE — Progress Notes (Signed)
Pt is aware.  

## 2017-06-29 NOTE — Progress Notes (Signed)
LMOM to call.

## 2017-06-29 NOTE — Progress Notes (Signed)
PT is aware of recommendations. He wants to know if he get dentures if it would help and I told him , yes, it would help a lot.  He would like a letter stating that he really does need dentures due to his gerd.  He said that might help his insurance to pay more for his dentures.  He is aware to try these recommendations and he is considering the surgery.

## 2017-08-11 ENCOUNTER — Telehealth: Payer: Self-pay | Admitting: Gastroenterology

## 2017-08-11 NOTE — Telephone Encounter (Signed)
What surgery is she referring to? I can't find anything in his past visit.  Find out if he is still taking his Protonix. He has Zofran, does that help at all? How much does he eat at a sitting? Any specific foods that tend to trigger nausea/vomiting (ie- spicy, greasy, tough, etc)

## 2017-08-11 NOTE — Telephone Encounter (Signed)
Called, many rings and no answer.

## 2017-08-11 NOTE — Telephone Encounter (Signed)
Called and left Vm for a return call. Minerva Areola( Eric, please see Dr. Evelina DunField's note of 06/09/2017 under the Gastric Emptying result about the reflux surgery).

## 2017-08-11 NOTE — Telephone Encounter (Signed)
I called and spoke to wife, Angelique BlonderDenise. She said pt is aware of his appt at the end of August. However, he was hoping to get in sooner and make a decision about some surgery that had been discussed. He still has the vomiting daily and he is just wanting to get something done about it. He is aware he is on cancellation list. Minerva Areolaric, any recommendations?

## 2017-08-11 NOTE — Telephone Encounter (Signed)
Pt is wanting to speak with the nurse. If patient isn't available we can speak with his wife, Angelique BlonderDenise. Please call [phone] Pt is aware that he is on a cancellation list.

## 2017-08-12 NOTE — Telephone Encounter (Signed)
Noted. Avoid triggers. We can get him in sooner if there's a cancellation. Otherwise continue current medications.

## 2017-08-12 NOTE — Telephone Encounter (Signed)
Alexander Carroll, pt said he is taking Protonix and the Zofran helps some. He is avoiding greasy and spicy foods. Other foods that he eats he might tolerate one day and the next time not tolerate at all.  Dr. Darrick PennaFields mentioned reflux surgery ( see Gastric Emptying result of 06/09/2017).  I told pt we are schedule out til Nov and his appt is end of August. He is aware that I will have him placed on Cancellation list because he could be here within 15 min or so.

## 2017-08-15 NOTE — Telephone Encounter (Signed)
Spoke with pt and moved him up to 08/25/17 with SF.

## 2017-08-25 ENCOUNTER — Telehealth: Payer: Self-pay | Admitting: *Deleted

## 2017-08-25 ENCOUNTER — Ambulatory Visit (INDEPENDENT_AMBULATORY_CARE_PROVIDER_SITE_OTHER): Payer: Managed Care, Other (non HMO) | Admitting: Gastroenterology

## 2017-08-25 ENCOUNTER — Encounter: Payer: Self-pay | Admitting: Gastroenterology

## 2017-08-25 ENCOUNTER — Encounter: Payer: Self-pay | Admitting: *Deleted

## 2017-08-25 DIAGNOSIS — K219 Gastro-esophageal reflux disease without esophagitis: Secondary | ICD-10-CM

## 2017-08-25 DIAGNOSIS — G43A1 Cyclical vomiting, intractable: Secondary | ICD-10-CM | POA: Diagnosis not present

## 2017-08-25 DIAGNOSIS — R1115 Cyclical vomiting syndrome unrelated to migraine: Secondary | ICD-10-CM

## 2017-08-25 DIAGNOSIS — G43A0 Cyclical vomiting, not intractable: Secondary | ICD-10-CM

## 2017-08-25 MED ORDER — PROMETHAZINE HCL 12.5 MG PO TABS: ORAL_TABLET | ORAL | 3 refills | Status: DC

## 2017-08-25 NOTE — Patient Instructions (Addendum)
COMPLETE CT OF THE HEAD.  COMPLETE LABS.   DRINK WATER TO KEEP YOUR URINE LIGHT YELLOW.  AVOID REFLUX TRIGGERS. SEE INFO BELOW.  CONTINUE YOUR WEIGHT LOSS EFFORTS. LOSE 20 LBS.   STRICTLY FOLLOW A LOW FAT DIET. MEATS SHOULD BE BAKED, BROILED, OR BOILED. AVOID FRIED FOODS. SEE INFO BELOW.  USE PHENERGAN OR ZOFRAN 30 MINS PRIOR TO MEALS OR AS NEEDED TO HELP CONTROL NAUSEA OR VOMITING.    FOLLOW UP IN 1 MOS.   Lifestyle and home remedies TO CONTROL HEARTBURN/NAUSEA/VOMITING You may eliminate or reduce the frequency of heartburn by making the following lifestyle changes:  . Control your weight. Being overweight is a major risk factor for heartburn and GERD. Excess pounds put pressure on your abdomen, pushing up your stomach and causing acid to back up into your esophagus.   . Eat smaller meals. 4 TO 6 MEALS A DAY. This reduces pressure on the lower esophageal sphincter, helping to prevent the valve from opening and acid from washing back into your esophagus.   Allena Earing. Loosen your belt. Clothes that fit tightly around your waist put pressure on your abdomen and the lower esophageal sphincter.   . Eliminate heartburn triggers. Everyone has specific triggers. Common triggers such as fatty or fried foods, spicy food, tomato sauce, carbonated beverages, alcohol, chocolate, mint, garlic, onion, caffeine and nicotine may make heartburn worse.   Marland Kitchen. Avoid stooping or bending. Tying your shoes is OK. Bending over for longer periods to weed your garden isn't, especially soon after eating.   . Don't lie down after a meal. Wait at least three to four hours after eating before going to bed, and don't lie down right after eating.   Alternative medicine . Several home remedies exist for treating GERD, but they provide only temporary relief. They include drinking baking soda (sodium bicarbonate) added to water or drinking other fluids such as baking soda mixed with cream of tartar and water. . Although these  liquids create temporary relief by neutralizing, washing away or buffering acids, eventually they aggravate the situation by adding gas and fluid to your stomach, increasing pressure and causing more acid reflux. Further, adding more sodium to your diet may increase your blood pressure and add stress to your heart, and excessive bicarbonate ingestion can alter the acid-base balance in your body.   Low-Fat Diet BREADS, CEREALS, PASTA, RICE, DRIED PEAS, AND BEANS These products are high in carbohydrates and most are low in fat. Therefore, they can be increased in the diet as substitutes for fatty foods. They too, however, contain calories and should not be eaten in excess. Cereals can be eaten for snacks as well as for breakfast.  Include foods that contain fiber (fruits, vegetables, whole grains, and legumes). Research shows that fiber may lower blood cholesterol levels, especially the water-soluble fiber found in fruits, vegetables, oat products, and legumes. FRUITS AND VEGETABLES It is good to eat fruits and vegetables. Besides being sources of fiber, both are rich in vitamins and some minerals. They help you get the daily allowances of these nutrients. Fruits and vegetables can be used for snacks and desserts. MEATS Limit lean meat, chicken, [place], and fish to no more than 6 ounces per day. Beef, Pork, and Lamb Use lean cuts of beef, pork, and lamb. Lean cuts include:  Extra-lean ground beef.  Arm roast.  Sirloin tip.  Center-cut ham.  Round steak.  Loin chops.  Rump roast.  Tenderloin.  Trim all fat off the outside of meats  before cooking. It is not necessary to severely decrease the intake of red meat, but lean choices should be made. Lean meat is rich in protein and contains a highly absorbable form of iron. Premenopausal women, in particular, should avoid reducing lean red meat because this could increase the risk for low red blood cells (iron-deficiency anemia).  Chicken and  [place] These are good sources of protein. The fat of poultry can be reduced by removing the skin and underlying fat layers before cooking. Chicken and [place] can be substituted for lean red meat in the diet. Poultry should not be fried or covered with high-fat sauces. Fish and Shellfish Fish is a good source of protein. Shellfish contain cholesterol, but they usually are low in saturated fatty acids. The preparation of fish is important. Like chicken and [place], they should not be fried or covered with high-fat sauces. EGGS Egg whites contain no fat or cholesterol. They can be eaten often. Try 1 to 2 egg whites instead of whole eggs in recipes or use egg substitutes that do not contain yolk.  MILK AND DAIRY PRODUCTS Use skim or 1% milk instead of 2% or whole milk. Decrease whole milk, natural, and processed cheeses. Use nonfat or low-fat (2%) cottage cheese or low-fat cheeses made from vegetable oils. Choose nonfat or low-fat (1 to 2%) yogurt. Experiment with evaporated skim milk in recipes that call for heavy cream. Substitute low-fat yogurt or low-fat cottage cheese for sour cream in dips and salad dressings. Have at least 2 servings of low-fat dairy products, such as 2 glasses of skim (or 1%) milk each day to help get your daily calcium intake.  FATS AND OILS Butterfat, lard, and beef fats are high in saturated fat and cholesterol. These should be avoided.Vegetable fats do not contain cholesterol. AVOID coconut oil, palm oil, and palm kernel oil, WHICH are very high in saturated fats. These should be limited. These fats are often used in bakery goods, processed foods, popcorn, oils, and nondairy creamers. Vegetable shortenings and some peanut butters contain hydrogenated oils, which are also saturated fats. Read the labels on these foods and check for saturated vegetable oils.  Desirable liquid vegetable oils are corn oil, cottonseed oil, olive oil, canola oil, safflower oil, soybean oil, and  sunflower oil. Peanut oil is not as good, but small amounts are acceptable. Buy a heart-healthy tub margarine that has no partially hydrogenated oils in the ingredients. AVOID Mayonnaise and salad dressings often are made from unsaturated fats.  OTHER EATING TIPS Snacks  Most sweets should be limited as snacks. They tend to be rich in calories and fats, and their caloric content outweighs their nutritional value. Some good choices in snacks are graham crackers, melba toast, soda crackers, bagels (no egg), English muffins, fruits, and vegetables. These snacks are preferable to snack crackers, [place] fries, and chips. Popcorn should be air-popped or cooked in small amounts of liquid vegetable oil.  Desserts Eat fruit, low-fat yogurt, and fruit ices instead of pastries, cake, and cookies. Sherbet, angel food cake, gelatin dessert, frozen low-fat yogurt, or other frozen products that do not contain saturated fat (pure fruit juice bars, frozen ice pops) are also acceptable.   COOKING METHODS Choose those methods that use little or no fat. They include: Poaching.  Braising.  Steaming.  Grilling.  Baking.  Stir-frying.  Broiling.  Microwaving.  Foods can be cooked in a nonstick pan without added fat, or use a nonfat cooking spray in regular cookware. Limit fried foods and  avoid frying in saturated fat. Add moisture to lean meats by using water, broth, cooking wines, and other nonfat or low-fat sauces along with the cooking methods mentioned above. Soups and stews should be chilled after cooking. The fat that forms on top after a few hours in the refrigerator should be skimmed off. When preparing meals, avoid using excess salt. Salt can contribute to raising blood pressure in some people.  EATING AWAY FROM HOME Order entres, potatoes, and vegetables without sauces or butter. When meat exceeds the size of a deck of cards (3 to 4 ounces), the rest can be taken home for another meal. Choose vegetable  or fruit salads and ask for low-calorie salad dressings to be served on the side. Use dressings sparingly. Limit high-fat toppings, such as bacon, crumbled eggs, cheese, sunflower seeds, and olives. Ask for heart-healthy tub margarine instead of butter.

## 2017-08-25 NOTE — Telephone Encounter (Signed)
PA initiated via [employer] website for [place] for CT head. PA is pending. Service order # [id number]. Clinicals have been faxed. Awaiting decision.

## 2017-08-25 NOTE — Assessment & Plan Note (Signed)
SYMPTOMS NOT CONTROLLED.  COMPLETE CT OF THE HEAD. COMPLETE LABS.  DRINK WATER TO KEEP YOUR URINE LIGHT YELLOW. AVOID REFLUX TRIGGERS.  HANDOUT GIVEN. CONTINUE YOUR WEIGHT LOSS EFFORTS. LOSE 20 LBS.  STRICTLY FOLLOW A LOW FAT DIET. MEATS SHOULD BE BAKED, BROILED, OR BOILED. AVOID FRIED FOODS.  HANDOUT GIVEN. USE PHENERGAN OR ZOFRAN 30 MINS PRIOR TO MEALS OR AS NEEDED TO HELP CONTROL NAUSEA OR VOMITING.   FOLLOW UP IN 1 MOS.

## 2017-08-25 NOTE — Progress Notes (Signed)
cc'ed to pcp °

## 2017-08-25 NOTE — Progress Notes (Signed)
cc'd to pcp 

## 2017-08-25 NOTE — Assessment & Plan Note (Addendum)
SYMPTOMS NOT CONTROLLED ON ZOFRAN PRN AND PROTONIX BID. DIFFERENTIAL DIAGNOSIS INCLUDES: UNCONTROLLED GERD, ADRENAL INSUFFICIENCY, THYROID DISTURBANCE, or HYDROCEPHALUS, LESS LIKELY POSTERIOR FOSSA TUMOR.   COMPLETE CT OF THE HEAD. COMPLETE LABS. DRINK WATER TO KEEP YOUR URINE LIGHT YELLOW. AVOID REFLUX TRIGGERS.  HANDOUT GIVEN. CONTINUE YOUR WEIGHT LOSS EFFORTS. LOSE 20 LBS.  STRICTLY FOLLOW A LOW FAT DIET. MEATS SHOULD BE BAKED, BROILED, OR BOILED. AVOID FRIED FOODS. SEE INFO BELOW. USE PHENERGAN OR ZOFRAN 30 MINS PRIOR TO MEALS OR AS NEEDED TO HELP CONTROL NAUSEA OR VOMITING.   FOLLOW UP IN 1 MOS.

## 2017-08-25 NOTE — Progress Notes (Addendum)
Subjective:    Patient ID: Alexander SeedsSamuel Carroll, male    DOB: 16-Jun-1965, 52 y.o.   MRN: [id number]  [facility]   HPI Still having problems with vomiting. BEEN GOING ON FOR ABOUT A YEAR. THINKS GETTING WORSE: MORE FREQUENT. PROBABLY ONE TIME AND SOMETIMES 4-5 TIMES. 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 FEELS IT COMING ON AND SOMETIMES ALL OF A SUDDEN. FEELS LIKE IT'S FOOD THAT'S NOT DIGESTED. MAY FEELS LIKE IT GETS STUCK AND SOMETIMES FEELS LIKE CAN'T KEEP IT DOWN. DOESN'T REALLY FEEL NAUSEATED. MAY BE ASSOCIATED WITH MILD CHEST PAIN. HEARTBURN CONTROLLED. BURPS ARE LOUD AND HARD. DRINK DIET CAFFEINE FREE. MAY EAT OUT AND FRIED FOODS. TRYING TO SEE IF HE CAN FIND A TRIGGER.  MAY VOMIT WITHIN 5 TO 20-30 MINS AFTER EATING. BMs: PRETTY GOOD. EYE PAIN x1. FACE PAIN/SORE THROAT AFTER THROWING UP. TWO YEARS AGO HASN'T HAD THIS PROBLEM. AT LEASTS 3-4 TIMES A WEEK.   PT DENIES FEVER, CHILLS, HEMATOCHEZIA, DIZZINESS, FACE PRESSURE, MVA, HEAD INJURY, NECK PAIN, EYE PAIN, TINNITUS, POSTERIOR NASAL DRAINAGE, SORE THROATHEADACHES,  HEMATEMESIS, nausea, vomiting, melena, diarrhea, SHORTNESS OF BREATH,  CHANGE IN BOWEL IN HABITS, constipation, OR abdominal pain   Past Medical History:  Diagnosis Date  . GERD (gastroesophageal reflux disease)   . Hiatal hernia   . Hiatal hernia     Past Surgical History:  Procedure Laterality Date  . BIOPSY  11/12/2016   Procedure: BIOPSY;  Surgeon: West BaliFields, Livianna Petraglia L, MD;  Location: [facility];  Service: Endoscopy;;  gastric and esophageal  . ESOPHAGOGASTRODUODENOSCOPY N/A 11/12/2016   Procedure: ESOPHAGOGASTRODUODENOSCOPY (EGD);  Surgeon: West BaliFields, Daniyah Fohl L, MD;  Location: [facility];  Service: Endoscopy;  Laterality: N/A;  830   . NONE TO DATE    . SAVORY DILATION N/A 11/12/2016   Procedure: SAVORY DILATION;  Surgeon: West BaliFields, Jaeden Messer L, MD;  Location: [facility];  Service: Endoscopy;  Laterality: N/A;   Allergies  Allergen  Reactions  . Penicillins Hives    Has patient had a PCN reaction causing immediate rash, facial/tongue/throat swelling, SOB or lightheadedness with hypotension: Unknown Has patient had a PCN reaction causing severe rash involving mucus membranes or skin necrosis: Unknown Has patient had a PCN reaction that required hospitalization: Unknown Has patient had a PCN reaction occurring within the last 10 years: No Childhood reaction. If all of the above answers are "NO", then may proceed with Cephalosporin use.     Current Outpatient Medications  Medication Sig    . acetaminophen (TYLENOL) 500 MG tablet Take 500-1,000 mg by mouth every 6 (six) hours as needed for mild pain or moderate pain (for headaches.).     Marland Kitchen. loratadine-pseudoephedrine (CLARITIN-D 12 HOUR) 5-120 MG tablet Take 1 tablet by mouth 2 (two) times daily. (Patient taking differently: Take 1 tablet by mouth 3 (three) times daily. )    . ondansetron (ZOFRAN) 4 MG tablet Take 1 tablet (4 mg total) by mouth 3 (three) times daily with meals as needed for nausea or vomiting.    . pantoprazole (PROTONIX) 40 MG tablet Take 1 tablet (40 mg total) by mouth 2 (two) times daily before a meal.    .       Review of Systems PER HPI OTHERWISE ALL SYSTEMS ARE NEGATIVE.    Objective:   Physical Exam  Constitutional: He is oriented to person, place, and time. He appears well-developed and well-nourished. No distress.  HENT:  Head: Normocephalic and atraumatic.  Mouth/Throat: Oropharynx is clear and moist. No oropharyngeal exudate.  Eyes: Pupils are equal, round, and reactive to light. No scleral icterus.  Neck: Normal range of motion. Neck supple.  Cardiovascular: Normal rate, regular rhythm and normal heart sounds.  Pulmonary/Chest: Effort normal and breath sounds normal. No respiratory distress.  Abdominal: Soft. Bowel sounds are normal. He exhibits no distension. There is tenderness. There is no rebound and no guarding.  MILD TTP IN THE  EPIGASTRIUM & BUQS.  Musculoskeletal: He exhibits no edema.  Lymphadenopathy:    He has no cervical adenopathy.  Neurological: He is alert and oriented to person, place, and time.  Psychiatric: He has a normal mood and affect.  Vitals reviewed.     Assessment & Plan:

## 2017-08-26 NOTE — Telephone Encounter (Signed)
[facility] requesting [facility]. Refaxed clinical notes to [facility].

## 2017-08-29 ENCOUNTER — Ambulatory Visit (HOSPITAL_COMMUNITY): Payer: Managed Care, Other (non HMO)

## 2017-08-29 NOTE — Telephone Encounter (Signed)
PA is still pending. Patient is aware will cancel appt for today. I will call him once I receive approval/denial from insurance.

## 2017-08-30 NOTE — Telephone Encounter (Signed)
PT NEED CT OF HEAD TO EVALUATE FOR HYDROCEPHALUS EVALUATION.

## 2017-08-30 NOTE — Telephone Encounter (Signed)
Called [facility] and spoke with Linwood DibblesKelsey W. Provided her with the new information. CT head was approved. She was unable to provide me with the auth # at this time. They have to speak with the patient prior to discuss to the auth being completed to discuss the facility. Per [facility], they prefer patient to go to another facility that is less expensive and will be discussing this with patient.    I called patient and made him aware. Will await for a call back from [facility] about which facility patient can go to

## 2017-08-30 NOTE — Telephone Encounter (Signed)
Per [employer]'S WEBSITE CT HEAD WAS DENIED:  Based on [facility] Head Imaging Guidelines, we cannot approve this request. MRI Brain is preferable to Head CT for most indications. A CT scan of the head is supported for the following: 1) to show mass effect, 2) to show blood/blood products, 3) the study is needed in an urgent/emergent setting due to availability of speed of CT, 4) following acute trauma, 5) following recent hemorrhage, 6) bony structures of the head evaluations, 6) hydrocephalus evaluation and follow-up, 7) prior to lumbar puncture in patients with cranial complaints, or 8) MRI is contraindicated (i.e. pacemakers, ICDs, cochlear implants, aneurysm clips, orbital metallic fragments etc.). The clinical information provided does not describe one of these criterions and, therefore, an MRI of the brain without contrast ([id number]) or MRI Brain without and with contrast ([id number]) can be approved as an alternative. and, therefore, the request is not indicated at this time. ---  Please advise Dr. Darrick PennaFields thanks

## 2017-08-30 NOTE — Telephone Encounter (Signed)
REVIEWED-NO ADDITIONAL RECOMMENDATIONS. 

## 2017-08-31 ENCOUNTER — Telehealth: Payer: Self-pay | Admitting: Gastroenterology

## 2017-08-31 NOTE — Telephone Encounter (Signed)
PATIENT SAID HE SPOKE TO HIS INSURANCE AND THEY SAID IT WAS OK TO HAVE HIS CT SCAN.  HE SAID HE HAS A REFERRAL NUMBER

## 2017-08-31 NOTE — Telephone Encounter (Signed)
CT head scheduled for 09/07/17, arrival time 5:45pm. Called patient, NA and no VM.

## 2017-08-31 NOTE — Telephone Encounter (Signed)
Called made patient aware of appt details. Nothing further needed

## 2017-09-07 ENCOUNTER — Ambulatory Visit (HOSPITAL_COMMUNITY)
Admission: RE | Admit: 2017-09-07 | Discharge: 2017-09-07 | Disposition: A | Discharge status: Home / Self Care | Payer: Managed Care, Other (non HMO) | Source: Ambulatory Visit | Attending: Gastroenterology | Admitting: Gastroenterology

## 2017-09-07 DIAGNOSIS — G43A1 Cyclical vomiting, intractable: Secondary | ICD-10-CM | POA: Diagnosis present

## 2017-09-07 DIAGNOSIS — R1115 Cyclical vomiting syndrome unrelated to migraine: Secondary | ICD-10-CM

## 2017-09-08 ENCOUNTER — Encounter

## 2017-09-08 ENCOUNTER — Encounter: Payer: Self-pay | Admitting: Gastroenterology

## 2017-09-08 ENCOUNTER — Ambulatory Visit: Payer: BLUE CROSS/BLUE SHIELD | Admitting: Gastroenterology

## 2017-09-08 NOTE — Progress Notes (Signed)
PT is aware of results. I went over the instructions for the AM cortisol lab. Nothing to eat or drink after midnight. Go to the lab between the hours of 7am and 10 am. Do not take any meds until after bloodwork.

## 2017-09-13 LAB — TSH: TSH: 5.28 m[IU]/L — AB (ref 0.40–4.50)

## 2017-09-13 LAB — CORTISOL: CORTISOL PLASMA: 14.8 ug/dL

## 2017-09-14 ENCOUNTER — Telehealth: Payer: Self-pay | Admitting: Gastroenterology

## 2017-09-14 NOTE — Telephone Encounter (Signed)
Noted  

## 2017-09-14 NOTE — Telephone Encounter (Signed)
Forwarding to Dr. Fields for results.  

## 2017-09-14 NOTE — Telephone Encounter (Signed)
Pt was calling to see if the lab results were available yet. [phone]

## 2017-09-14 NOTE — Telephone Encounter (Signed)
SEE RESULT NOTE 

## 2017-09-15 ENCOUNTER — Other Ambulatory Visit: Payer: Self-pay | Admitting: *Deleted

## 2017-09-15 ENCOUNTER — Encounter: Payer: Self-pay | Admitting: *Deleted

## 2017-09-15 DIAGNOSIS — R1319 Other dysphagia: Secondary | ICD-10-CM

## 2017-09-15 DIAGNOSIS — R1115 Cyclical vomiting syndrome unrelated to migraine: Secondary | ICD-10-CM

## 2017-09-15 DIAGNOSIS — K219 Gastro-esophageal reflux disease without esophagitis: Secondary | ICD-10-CM

## 2017-09-15 DIAGNOSIS — R1310 Dysphagia, unspecified: Secondary | ICD-10-CM

## 2017-09-15 NOTE — Progress Notes (Signed)
PT is aware. OK to refer to [facility].  Forwarding to [facility] to refer.

## 2017-09-15 NOTE — Progress Notes (Signed)
LMOM to call.

## 2017-09-21 ENCOUNTER — Telehealth: Payer: Self-pay | Admitting: Gastroenterology

## 2017-09-21 NOTE — Telephone Encounter (Signed)
Pt called to let us know that he received his information for his referral to [facility] and to say that he is still throwing up. [phone]

## 2017-09-21 NOTE — Telephone Encounter (Signed)
Tried to call x 2 and the line is busy.

## 2017-09-21 NOTE — Telephone Encounter (Signed)
Tried to call again and call would not go through.

## 2017-09-22 NOTE — Telephone Encounter (Signed)
Tried to call and call will not go through.

## 2017-09-26 NOTE — Telephone Encounter (Signed)
REVIEWED-NO ADDITIONAL RECOMMENDATIONS. 

## 2017-09-26 NOTE — Telephone Encounter (Signed)
I called pt ane he said that the phones had been out. He wanted to let Dr. Darrick PennaFields know that [facility] has [facility]. [facility] and they are planning to put him on medication for thyroid. He has not vomited since last Tuesday. He still has medication for nausea and will call us if he has any problems.

## 2017-11-02 ENCOUNTER — Encounter: Payer: Self-pay | Admitting: Gastroenterology

## 2017-11-02 ENCOUNTER — Other Ambulatory Visit: Payer: Self-pay | Admitting: *Deleted

## 2017-11-02 ENCOUNTER — Ambulatory Visit (INDEPENDENT_AMBULATORY_CARE_PROVIDER_SITE_OTHER): Payer: Managed Care, Other (non HMO) | Admitting: Gastroenterology

## 2017-11-02 DIAGNOSIS — R112 Nausea with vomiting, unspecified: Secondary | ICD-10-CM

## 2017-11-02 DIAGNOSIS — K219 Gastro-esophageal reflux disease without esophagitis: Secondary | ICD-10-CM

## 2017-11-02 DIAGNOSIS — R1312 Dysphagia, oropharyngeal phase: Secondary | ICD-10-CM | POA: Diagnosis not present

## 2017-11-02 DIAGNOSIS — Z1211 Encounter for screening for malignant neoplasm of colon: Secondary | ICD-10-CM | POA: Diagnosis not present

## 2017-11-02 NOTE — Assessment & Plan Note (Signed)
AVERAGE RISK.  PT AND WIFE REPORT HE HAD TCS WITH [facility] IN 2018. WILL OBTAIN REPORT TO CONFIRM.

## 2017-11-02 NOTE — Assessment & Plan Note (Addendum)
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  CONTINUE YOUR WEIGHT LOSS EFFORTS. LOSE 20 LBS.  DRINK WATER TO KEEP YOUR URINE LIGHT YELLOW. AVOID REFLUX TRIGGERS.  HANDOUT GIVEN. FOLLOW A FULL LIQUID OR SOFT MECHANICAL DIET.  MEATS SHOULD BE CHOPPED OR GROUND ONLY. DO NOT EAT CHUNKS OF ANYTHING.SEE HANDOUT. CONTINUE PROTONIX. TAKE 30 MINUTES PRIOR TO MEALS TWICE DAILY. USE ZOFRAN 30 MIN SPRIOR TO BREAKFAST LUNCH AND DINNER.  SEE SURGERY TO DISCUSS REFLUX SURGERY.  FOLLOW UP IN 3 MOS.

## 2017-11-02 NOTE — Assessment & Plan Note (Addendum)
SYMPTOMS NOT IDEALLY CONTROLLED BUT PT ALSO DOES NOT HAVE TEETH TO CHEW. DYSPHAGIA LIKELY RELATED TO SIZE OF FOOD BOLUS AND PT DIMINISHED ABILITY TO CHEW FOOD PROPERLY. ESOPHAGEAL MANOMETRY IN 2018 WAS NORMAL.  CONTINUE TO MONITOR SYMPTOMS. PT WILL ATTEMPT TO OBATIN DENTURES.

## 2017-11-02 NOTE — Assessment & Plan Note (Signed)
SYMPTOMS NOT CONTROLLED AND MOST LIKELY DUE TO UNCONTROLLED GERD.NL GES/ESOPHAGEAL MANOMETRY SHOWING LOW LESP WITHIN THE LAST YEAR.  CONTINUE YOUR WEIGHT LOSS EFFORTS. LOSE 20 LBS.  DRINK WATER TO KEEP YOUR URINE LIGHT YELLOW. AVOID REFLUX TRIGGERS.  HANDOUT GIVEN. FOLLOW A FULL LIQUID OR SOFT MECHANICAL DIET.  MEATS SHOULD BE CHOPPED OR GROUND ONLY. DO NOT EAT CHUNKS OF ANYTHING.SEE HANDOUT. CONTINUE PROTONIX. TAKE 30 MINUTES PRIOR TO MEALS TWICE DAILY. USE ZOFRAN 30 MIN SPRIOR TO BREAKFAST LUNCH AND DINNER.  SEE SURGERY TO DISCUSS REFLUX SURGERY.  FOLLOW UP IN 3 MOS.

## 2017-11-02 NOTE — Progress Notes (Addendum)
   Subjective:    Patient ID: Alexander Carroll, male    DOB: [birth year], 52 y.o.   MRN: [id number]  [facility]   HPI EATS AND THEN VOMITS AND SOMETIMES IT DOESN'T HAPPEN. MAY HAPPEN 3 MINS-15 MINS AFTER EATING. MAY CAUSE PROBLEMS AT WORK. WEIGHT UP SINCE AUG 2018 FROM 248 LBS TO 256 LBS TODAY. ATE SALAD. ZOFRAN HELPS BEFORE EATS. LAST VOMITED: SUN(NO BLOOD). HAS APPT AT [facility] NOV 2019. HAD A FLARE FOR 3 WEEKS AGO AND LASTED EVERY DAY FOR 7 DAYS IN A ROW. DOES NOT HAPPEN IN HIS SLEEP. 4 HR GES NL. FEELS LIKE FOOD GETS STUCK GOING DOWN. CAN BE EVERYTIME HE EATS. MAY HAPPEN MORE WITH SOLIDS. VOMITING IS TAKING AN EMOTIONAL TOLL. BMs: MOVING FINE. Occasional HEARTBURN AND UPPER ABDOMINAL PAIN.  PT DENIES FEVER, CHILLS, HEMATOCHEZIA, HEMATEMESIS,  melena, diarrhea, CHEST PAIN, or SHORTNESS OF BREATH.    Past Medical History:  Diagnosis Date  . GERD (gastroesophageal reflux disease)   . Hiatal hernia        Past Surgical History:  Procedure Laterality Date  . BIOPSY  11/12/2016     . ESOPHAGOGASTRODUODENOSCOPY N/A 11/12/2016          . SAVORY DILATION N/A 11/12/2016      Allergies  Allergen Reactions  . Penicillins Hives    Has patient had a PCN reaction causing immediate rash, facial/tongue/throat swelling, SOB or    Current Outpatient Medications  Medication Sig    . acetaminophen (TYLENOL) 500 MG tablet Take 500-1,000 mg by mouth every 6 (six) hours as needed for mild pain or moderate pain (for headaches.).     Marland Kitchen loratadine-pseudoephedrine (CLARITIN-D 12 HOUR) 5-120 MG tablet Take 1 tablet by mouth 2 (two) times daily. (Patient taking differently: Take 1 tablet by mouth 3 (three) times daily. )    . ondansetron (ZOFRAN) 4 MG tablet Take 1 tablet (4 mg total) by mouth 3 (three) times daily with meals as needed for nausea or vomiting.    . pantoprazole (PROTONIX) 40 MG tablet Take 1 tablet (40 mg total) by mouth 2 (two) times daily before a meal.    .      .        Review of Systems PER HPI OTHERWISE ALL SYSTEMS ARE NEGATIVE.    Objective:   Physical Exam  Constitutional: He is oriented to person, place, and time. He appears well-developed and well-nourished. No distress.  HENT:  Head: Normocephalic and atraumatic.  Mouth/Throat: Oropharynx is clear and moist. No oropharyngeal exudate.  EDENTULOUS  Eyes: Pupils are equal, round, and reactive to light. No scleral icterus.  Neck: Normal range of motion. Neck supple.  Cardiovascular: Normal rate, regular rhythm and normal heart sounds.  Pulmonary/Chest: Effort normal and breath sounds normal. No respiratory distress.  Abdominal: Soft. Bowel sounds are normal. He exhibits no distension. There is tenderness. There is no rebound and no guarding.  MILD TTP IN THE EPIGASTRIUM & BUQS.  Musculoskeletal: He exhibits no edema.  Lymphadenopathy:    He has no cervical adenopathy.  Neurological: He is alert and oriented to person, place, and time.  NO  NEW FOCAL DEFICITS  Psychiatric:  FLAT AFFECT, SLIGHTLY ANXIOUS MOOD  Vitals reviewed.     Assessment & Plan:

## 2017-11-02 NOTE — Progress Notes (Signed)
CC'D TO PCP °

## 2017-11-02 NOTE — Patient Instructions (Addendum)
CONTINUE YOUR WEIGHT LOSS EFFORTS. LOSE 20 LBS.  DRINK WATER TO KEEP YOUR URINE LIGHT YELLOW.  AVOID REFLUX TRIGGERS. SEE INFO BELOW.  FOLLOW A FULL LIQUID OR SOFT MECHANICAL DIET.  MEATS SHOULD BE CHOPPED OR GROUND ONLY. DO NOT EAT CHUNKS OF ANYTHING.SEE HANDOUT.  CONTINUE PROTONIX. TAKE 30 MINUTES PRIOR TO MEALS TWICE DAILY.  USE ZOFRAN 30 MIN SPRIOR TO BREAKFAST LUNCH AND DINNER.   SEE SURGERY TO DISCUSS REFLUX SURGERY.  FOLLOW UP IN 3 MOS.   Lifestyle and home remedies TO MANAGE REFLUX/CHEST PAIN  You may eliminate or reduce the frequency of heartburn by making the following lifestyle changes:  . Control your weight. Being overweight is a major risk factor for heartburn and GERD. Excess pounds put pressure on your abdomen, pushing up your stomach and causing acid to back up into your esophagus.   . Eat smaller meals. 4 TO 6 MEALS A DAY. This reduces pressure on the lower esophageal sphincter, helping to prevent the valve from opening and acid from washing back into your esophagus.   Allena Earing your belt. Clothes that fit tightly around your waist put pressure on your abdomen and the lower esophageal sphincter.   . Eliminate heartburn triggers. Everyone has specific triggers. Common triggers such as fatty or fried foods, spicy food, tomato sauce, carbonated beverages, alcohol, chocolate, mint, garlic, onion, caffeine and nicotine may make heartburn worse.   Marland Kitchen Avoid stooping or bending. Tying your shoes is OK. Bending over for longer periods to weed your garden isn't, especially soon after eating.   . Don't lie down after a meal. Wait at least three to four hours after eating before going to bed, and don't lie down right after eating.   Marland Kitchen PUT THE HEAD OF YOUR BED ON 6 INCH BLOCKS.   Alternative medicine . Several home remedies exist for treating GERD, but they provide only temporary relief. They include drinking baking soda (sodium bicarbonate) added to water or drinking other  fluids such as baking soda mixed with cream of tartar and water.  . Although these liquids create temporary relief by neutralizing, washing away or buffering acids, eventually they aggravate the situation by adding gas and fluid to your stomach, increasing pressure and causing more acid reflux. Further, adding more sodium to your diet may increase your blood pressure and add stress to your heart, and excessive bicarbonate ingestion can alter the acid-base balance in your body.      Low-Fat Diet BREADS, CEREALS, PASTA, RICE, DRIED PEAS, AND BEANS These products are high in carbohydrates and most are low in fat. Therefore, they can be increased in the diet as substitutes for fatty foods. They too, however, contain calories and should not be eaten in excess. Cereals can be eaten for snacks as well as for breakfast.   FRUITS AND VEGETABLES It is good to eat fruits and vegetables. Besides being sources of fiber, both are rich in vitamins and some minerals. They help you get the daily allowances of these nutrients. Fruits and vegetables can be used for snacks and desserts.  MEATS Limit lean meat, chicken, [place], and fish to no more than 6 ounces per day. Beef, Pork, and Lamb Use lean cuts of beef, pork, and lamb. Lean cuts include:  Extra-lean ground beef.  Arm roast.  Sirloin tip.  Center-cut ham.  Round steak.  Loin chops.  Rump roast.  Tenderloin.  Trim all fat off the outside of meats before cooking. It is not necessary to severely decrease  the intake of red meat, but lean choices should be made. Lean meat is rich in protein and contains a highly absorbable form of iron. Premenopausal women, in particular, should avoid reducing lean red meat because this could increase the risk for low red blood cells (iron-deficiency anemia).  Chicken and [place] These are good sources of protein. The fat of poultry can be reduced by removing the skin and underlying fat layers before cooking. Chicken and  [place] can be substituted for lean red meat in the diet. Poultry should not be fried or covered with high-fat sauces. Fish and Shellfish Fish is a good source of protein. Shellfish contain cholesterol, but they usually are low in saturated fatty acids. The preparation of fish is important. Like chicken and [place], they should not be fried or covered with high-fat sauces. EGGS Egg whites contain no fat or cholesterol. They can be eaten often. Try 1 to 2 egg whites instead of whole eggs in recipes or use egg substitutes that do not contain yolk. MILK AND DAIRY PRODUCTS Use skim or 1% milk instead of 2% or whole milk. Decrease whole milk, natural, and processed cheeses. Use nonfat or low-fat (2%) cottage cheese or low-fat cheeses made from vegetable oils. Choose nonfat or low-fat (1 to 2%) yogurt. Experiment with evaporated skim milk in recipes that call for heavy cream. Substitute low-fat yogurt or low-fat cottage cheese for sour cream in dips and salad dressings. Have at least 2 servings of low-fat dairy products, such as 2 glasses of skim (or 1%) milk each day to help get your daily calcium intake. FATS AND OILS Reduce the total intake of fats, especially saturated fat. Butterfat, lard, and beef fats are high in saturated fat and cholesterol. These should be avoided as much as possible. Vegetable fats do not contain cholesterol, but certain vegetable fats, such as coconut oil, palm oil, and palm kernel oil are very high in saturated fats. These should be limited. These fats are often used in bakery goods, processed foods, popcorn, oils, and nondairy creamers. Vegetable shortenings and some peanut butters contain hydrogenated oils, which are also saturated fats. Read the labels on these foods and check for saturated vegetable oils. Unsaturated vegetable oils and fats do not raise blood cholesterol. However, they should be limited because they are fats and are high in calories. Total fat should still be  limited to 30% of your daily caloric intake. Desirable liquid vegetable oils are corn oil, cottonseed oil, olive oil, canola oil, safflower oil, soybean oil, and sunflower oil. Peanut oil is not as good, but small amounts are acceptable. Buy a heart-healthy tub margarine that has no partially hydrogenated oils in the ingredients. Mayonnaise and salad dressings often are made from unsaturated fats, but they should also be limited because of their high calorie and fat content. Seeds, nuts, peanut butter, olives, and avocados are high in fat, but the fat is mainly the unsaturated type. These foods should be limited mainly to avoid excess calories and fat. OTHER EATING TIPS Snacks  Most sweets should be limited as snacks. They tend to be rich in calories and fats, and their caloric content outweighs their nutritional value. Some good choices in snacks are graham crackers, melba toast, soda crackers, bagels (no egg), English muffins, fruits, and vegetables. These snacks are preferable to snack crackers, [place] fries, TORTILLA CHIPS, and POTATO chips. Popcorn should be air-popped or cooked in small amounts of liquid vegetable oil. Desserts Eat fruit, low-fat yogurt, and fruit ices instead of  pastries, cake, and cookies. Sherbet, angel food cake, gelatin dessert, frozen low-fat yogurt, or other frozen products that do not contain saturated fat (pure fruit juice bars, frozen ice pops) are also acceptable.  COOKING METHODS Choose those methods that use little or no fat. They include: Poaching.  Braising.  Steaming.  Grilling.  Baking.  Stir-frying.  Broiling.  Microwaving.  Foods can be cooked in a nonstick pan without added fat, or use a nonfat cooking spray in regular cookware. Limit fried foods and avoid frying in saturated fat. Add moisture to lean meats by using water, broth, cooking wines, and other nonfat or low-fat sauces along with the cooking methods mentioned above. Soups and stews should be  chilled after cooking. The fat that forms on top after a few hours in the refrigerator should be skimmed off. When preparing meals, avoid using excess salt. Salt can contribute to raising blood pressure in some people.  EATING AWAY FROM HOME Order entres, potatoes, and vegetables without sauces or butter. When meat exceeds the size of a deck of cards (3 to 4 ounces), the rest can be taken home for another meal. Choose vegetable or fruit salads and ask for low-calorie salad dressings to be served on the side. Use dressings sparingly. Limit high-fat toppings, such as bacon, crumbled eggs, cheese, sunflower seeds, and olives. Ask for heart-healthy tub margarine instead of butter.

## 2018-01-26 ENCOUNTER — Ambulatory Visit (INDEPENDENT_AMBULATORY_CARE_PROVIDER_SITE_OTHER): Payer: Managed Care, Other (non HMO) | Admitting: Gastroenterology

## 2018-01-26 DIAGNOSIS — R112 Nausea with vomiting, unspecified: Secondary | ICD-10-CM

## 2018-01-26 DIAGNOSIS — K219 Gastro-esophageal reflux disease without esophagitis: Secondary | ICD-10-CM

## 2018-01-26 NOTE — Patient Instructions (Signed)
CONTINUE YOUR WEIGHT LOSS EFFORTS. LOSE 20 LBS.  AVOID REFLUX TRIGGERS.  SEE INFO BELOW.  CONTINUE PROTONIX FOR 6 MOS. TAKE 30 MINUTES PRIOR TO MEALS TWICE DAILY.  IF NO IMPROVEMENT OVER THE NEXT 6 MOS, THEN CONSIDER REFLUX SURGERY.  FOLLOW UP IN 6 MOS.     RECOMMENDATIONS TO REDUCE REFLUX/VOMITING  You may eliminate or reduce the frequency of heartburn by making the following lifestyle changes:  . Control your weight. Being overweight is a major risk factor for heartburn and GERD. Excess pounds put pressure on your abdomen, pushing up your stomach and causing acid to back up into your esophagus.   . Eat smaller meals. 4 TO 6 MEALS A DAY. This reduces pressure on the lower esophageal sphincter, helping to prevent the valve from opening and acid from washing back into your esophagus.   Allena Earing. Loosen your belt. Clothes that fit tightly around your waist put pressure on your abdomen and the lower esophageal sphincter.   . Eliminate heartburn triggers. Everyone has specific triggers. Common triggers such as fatty or fried foods, spicy food, tomato sauce, carbonated beverages, alcohol, chocolate, mint, garlic, onion, caffeine and nicotine may make heartburn worse.   Marland Kitchen. Avoid stooping or bending. Tying your shoes is OK. Bending over for longer periods to weed your garden isn't, especially soon after eating.   . Don't lie down after a meal. Wait at least three to four hours after eating before going to bed, and don't lie down right after eating.   Marland Kitchen. PUT THE HEAD OF YOUR BED ON 6 INCH BLOCKS.   Alternative medicine . Several home remedies exist for treating GERD, but they provide only temporary relief. They include drinking baking soda (sodium bicarbonate) added to water or drinking other fluids such as baking soda mixed with cream of tartar and water.  . Although these liquids create temporary relief by neutralizing, washing away or buffering acids, eventually they aggravate the situation by  adding gas and fluid to your stomach, increasing pressure and causing more acid reflux. Further, adding more sodium to your diet may increase your blood pressure and add stress to your heart, and excessive bicarbonate ingestion can alter the acid-base balance in your body.     Low-Fat Diet BREADS, CEREALS, PASTA, RICE, DRIED PEAS, AND BEANS These products are high in carbohydrates and most are low in fat. Therefore, they can be increased in the diet as substitutes for fatty foods. They too, however, contain calories and should not be eaten in excess. Cereals can be eaten for snacks as well as for breakfast.   FRUITS AND VEGETABLES It is good to eat fruits and vegetables. Besides being sources of fiber, both are rich in vitamins and some minerals. They help you get the daily allowances of these nutrients. Fruits and vegetables can be used for snacks and desserts.  MEATS Limit lean meat, chicken, [place], and fish to no more than 6 ounces per day. Beef, Pork, and Lamb Use lean cuts of beef, pork, and lamb. Lean cuts include:  Extra-lean ground beef.  Arm roast.  Sirloin tip.  Center-cut ham.  Round steak.  Loin chops.  Rump roast.  Tenderloin.  Trim all fat off the outside of meats before cooking. It is not necessary to severely decrease the intake of red meat, but lean choices should be made. Lean meat is rich in protein and contains a highly absorbable form of iron. Premenopausal women, in particular, should avoid reducing lean red meat because this could  increase the risk for low red blood cells (iron-deficiency anemia).  Chicken and [place] These are good sources of protein. The fat of poultry can be reduced by removing the skin and underlying fat layers before cooking. Chicken and [place] can be substituted for lean red meat in the diet. Poultry should not be fried or covered with high-fat sauces. Fish and Shellfish Fish is a good source of protein. Shellfish contain cholesterol, but  they usually are low in saturated fatty acids. The preparation of fish is important. Like chicken and [place], they should not be fried or covered with high-fat sauces. EGGS Egg whites contain no fat or cholesterol. They can be eaten often. Try 1 to 2 egg whites instead of whole eggs in recipes or use egg substitutes that do not contain yolk. MILK AND DAIRY PRODUCTS Use skim or 1% milk instead of 2% or whole milk. Decrease whole milk, natural, and processed cheeses. Use nonfat or low-fat (2%) cottage cheese or low-fat cheeses made from vegetable oils. Choose nonfat or low-fat (1 to 2%) yogurt. Experiment with evaporated skim milk in recipes that call for heavy cream. Substitute low-fat yogurt or low-fat cottage cheese for sour cream in dips and salad dressings. Have at least 2 servings of low-fat dairy products, such as 2 glasses of skim (or 1%) milk each day to help get your daily calcium intake. FATS AND OILS Reduce the total intake of fats, especially saturated fat. Butterfat, lard, and beef fats are high in saturated fat and cholesterol. These should be avoided as much as possible. Vegetable fats do not contain cholesterol, but certain vegetable fats, such as coconut oil, palm oil, and palm kernel oil are very high in saturated fats. These should be limited. These fats are often used in bakery goods, processed foods, popcorn, oils, and nondairy creamers. Vegetable shortenings and some peanut butters contain hydrogenated oils, which are also saturated fats. Read the labels on these foods and check for saturated vegetable oils. Unsaturated vegetable oils and fats do not raise blood cholesterol. However, they should be limited because they are fats and are high in calories. Total fat should still be limited to 30% of your daily caloric intake. Desirable liquid vegetable oils are corn oil, cottonseed oil, olive oil, canola oil, safflower oil, soybean oil, and sunflower oil. Peanut oil is not as good, but small  amounts are acceptable. Buy a heart-healthy tub margarine that has no partially hydrogenated oils in the ingredients. Mayonnaise and salad dressings often are made from unsaturated fats, but they should also be limited because of their high calorie and fat content. Seeds, nuts, peanut butter, olives, and avocados are high in fat, but the fat is mainly the unsaturated type. These foods should be limited mainly to avoid excess calories and fat. OTHER EATING TIPS Snacks  Most sweets should be limited as snacks. They tend to be rich in calories and fats, and their caloric content outweighs their nutritional value. Some good choices in snacks are graham crackers, melba toast, soda crackers, bagels (no egg), English muffins, fruits, and vegetables. These snacks are preferable to snack crackers, [place] fries, TORTILLA CHIPS, and POTATO chips. Popcorn should be air-popped or cooked in small amounts of liquid vegetable oil. Desserts Eat fruit, low-fat yogurt, and fruit ices instead of pastries, cake, and cookies. Sherbet, angel food cake, gelatin dessert, frozen low-fat yogurt, or other frozen products that do not contain saturated fat (pure fruit juice bars, frozen ice pops) are also acceptable.  COOKING METHODS Choose those  methods that use little or no fat. They include: Poaching.  Braising.  Steaming.  Grilling.  Baking.  Stir-frying.  Broiling.  Microwaving.  Foods can be cooked in a nonstick pan without added fat, or use a nonfat cooking spray in regular cookware. Limit fried foods and avoid frying in saturated fat. Add moisture to lean meats by using water, broth, cooking wines, and other nonfat or low-fat sauces along with the cooking methods mentioned above. Soups and stews should be chilled after cooking. The fat that forms on top after a few hours in the refrigerator should be skimmed off. When preparing meals, avoid using excess salt. Salt can contribute to raising blood pressure in some  people.  EATING AWAY FROM HOME Order entres, potatoes, and vegetables without sauces or butter. When meat exceeds the size of a deck of cards (3 to 4 ounces), the rest can be taken home for another meal. Choose vegetable or fruit salads and ask for low-calorie salad dressings to be served on the side. Use dressings sparingly. Limit high-fat toppings, such as bacon, crumbled eggs, cheese, sunflower seeds, and olives. Ask for heart-healthy tub margarine instead of butter.

## 2018-01-26 NOTE — Progress Notes (Signed)
ON RECALL  °

## 2018-01-26 NOTE — Assessment & Plan Note (Addendum)
ASSOCIATED WITH NAUSEA/VOMITING MOST LIKELY DUE TO BMI > 30 AND DIETARY NON-ADHERENCE.  CONTINUE YOUR WEIGHT LOSS EFFORTS. LOSE 20 LBS. AVOID REFLUX TRIGGERS.  HANDOUT GIVEN. CONTINUE PROTONIX FOR 6 MOS. TAKE 30 MINUTES PRIOR TO MEALS TWICE DAILY. IF NO IMPROVEMENT OVER THE NEXT 6 MOS, THEN CONSIDER EGD/DIL BRAVO-->REFLUX SURGERY. FOLLOW UP IN 6 MOS.

## 2018-01-26 NOTE — Assessment & Plan Note (Addendum)
SYMPTOMS NOT IDEALLY CONTROLLED AND DUE TO GERD.  CONTINUE YOUR WEIGHT LOSS EFFORTS. LOSE 20 LBS. AVOID REFLUX TRIGGERS.   HANDOUT GIVEN. CONTINUE PROTONIX FOR 6 MOS. TAKE 30 MINUTES PRIOR TO MEALS TWICE DAILY. IF NO IMPROVEMENT OVER THE NEXT 6 MOS, THEN CONSIDER EGD/DIL BRAVO-->REFLUX SURGERY.  FOLLOW UP IN 6 MOS.

## 2018-01-26 NOTE — Progress Notes (Signed)
   Subjective:    Patient ID: Alexander Carroll, male    DOB: [birth year], 53 y.o.   MRN: [id number]  [facility]  HPI SAW DR. FINA AND FELT VOMITING DUE TO GERD. INCREASED PROTONIX. VOMITING: DEPENDS ON THE DAY. ATE @ 2PM-HEROES(PHILLY CHEESE STEAK AND REGULAR SWEET TEA). HOME ABOUT 430-5P AND LAID DOWN AND @7 -8P AND THREW UP. BMs: FINE. HEARTBURN: FEELS IT EVER ONCE IN AWHILE: 1-2X/WEEK. WEIGHT LOSS: 5 LBS SINCE OCT 2019.  PT DENIES FEVER, CHILLS, HEMATOCHEZIA, HEMATEMESIS, melena, diarrhea, CHEST PAIN, SHORTNESS OF BREATH,  CHANGE IN BOWEL IN HABITS, constipation, abdominal pain, OR problems swallowing.  Past Medical History:  Diagnosis Date  . GERD (gastroesophageal reflux disease)   . Hiatal hernia    SMALL    Past Surgical History:  Procedure Laterality Date  . BIOPSY  11/12/2016     . COLONOSCOPY  2018   [facility]: [facility]  . ESOPHAGOGASTRODUODENOSCOPY N/A 11/12/2016     . SAVORY DILATION N/A 11/12/2016      Allergies  Allergen Reactions  . Penicillins Hives       Current Outpatient Medications  Medication Sig    . acetaminophen (TYLENOL) 500 MG tablet Take 500-1,000 mg by mouth every 6 (six) hours as needed for mild pain or moderate pain (for headaches.).     Marland Kitchen ondansetron (ZOFRAN) 4 MG tablet Take 1 tablet (4 mg total) by mouth 3 (three) times daily with meals as needed for nausea or vomiting.    . pantoprazole (PROTONIX) 40 MG tablet Take 1 tablet (40 mg total) by mouth 2 (two) times daily before a meal.    . loratadine-pseudoephedrine (CLARITIN-D 12 HOUR) 5-120 MG tablet Take 1 tablet by mouth 2 (two) times daily. (Patient not taking: Reported on 01/26/2018)    .      Marland Kitchen       Review of Systems PER HPI OTHERWISE ALL SYSTEMS ARE NEGATIVE.    Objective:   Physical Exam Vitals signs reviewed.  Constitutional:      General: He is not in acute distress.    Appearance: He is well-developed.  HENT:     Head: Normocephalic and atraumatic.   Mouth/Throat:     Pharynx: No oropharyngeal exudate.  Eyes:     General: No scleral icterus.    Pupils: Pupils are equal, round, and reactive to light.  Neck:     Musculoskeletal: Normal range of motion and neck supple.  Cardiovascular:     Rate and Rhythm: Normal rate and regular rhythm.     Heart sounds: Normal heart sounds.  Pulmonary:     Effort: Pulmonary effort is normal. No respiratory distress.     Breath sounds: Normal breath sounds.  Abdominal:     General: Bowel sounds are normal. There is no distension.     Palpations: Abdomen is soft.     Tenderness: There is no abdominal tenderness.  Musculoskeletal:        General: No swelling.  Lymphadenopathy:     Cervical: No cervical adenopathy.  Skin:    General: Skin is warm and dry.  Neurological:     Mental Status: He is alert and oriented to person, place, and time. Mental status is at baseline.     Comments: NO  NEW FOCAL DEFICITS       Assessment & Plan:

## 2018-01-30 NOTE — Progress Notes (Signed)
CC'D TO PCP °

## 2018-07-03 ENCOUNTER — Other Ambulatory Visit: Payer: Self-pay

## 2018-07-03 ENCOUNTER — Other Ambulatory Visit: Payer: Managed Care, Other (non HMO)

## 2018-07-03 DIAGNOSIS — Z20822 Contact with and (suspected) exposure to covid-19: Secondary | ICD-10-CM

## 2018-07-08 LAB — NOVEL CORONAVIRUS, NAA: SARS-CoV-2, NAA: NOT DETECTED

## 2018-07-10 ENCOUNTER — Telehealth: Payer: Self-pay | Admitting: Hematology

## 2018-07-10 NOTE — Telephone Encounter (Signed)
Negative result was given to pt °

## 2018-07-13 ENCOUNTER — Encounter: Payer: Self-pay | Admitting: Gastroenterology

## 2018-07-13 ENCOUNTER — Ambulatory Visit (INDEPENDENT_AMBULATORY_CARE_PROVIDER_SITE_OTHER): Payer: Managed Care, Other (non HMO) | Admitting: Gastroenterology

## 2018-07-13 ENCOUNTER — Other Ambulatory Visit: Payer: Self-pay | Admitting: *Deleted

## 2018-07-13 ENCOUNTER — Other Ambulatory Visit: Payer: Self-pay

## 2018-07-13 DIAGNOSIS — K219 Gastro-esophageal reflux disease without esophagitis: Secondary | ICD-10-CM

## 2018-07-13 NOTE — Patient Instructions (Addendum)
YOU HAVE REFLUX AND WHEN IT IS NOT CONTROLLED YOU WILL THROW UP. IF YOU EAT THE WRONG FOOD AND A LARGE MEAL THIS WILL BE MORE LIKELY TO HAPPEN.   TO CONTROL REFLUX/VOMITING:   1. DRINK WATER TO KEEP YOUR URINE LIGHT YELLOW.    2. EAT 4-6 SMALL MEALS A DAY.    3. AVOID REFLUX TRIGGERS AND FOLLOW A LOW FAT DIET. AVOID FRIED FOODS. MEATS SHOULD BE BAKED, BROILED, OR BOILED. SEE INFO BELOW.    4. CONTINUE YOUR WEIGHT LOSS EFFORTS.     5. CONTINUE PROTONIX. TAKE 30 MINUTES PRIOR TO MEALS TWICE DAILY.    6. USE PEPCID OR TAGAMET WHEN YOU HAVE BREAKTHROUGH HEARTBURN/REFLUX.    7. SLEEP WITH ON A WEDGE OR PUT THE HEAD OF YOUR BED ON 6 INCH BLOCKS TO KEEP YOUR HEAD ABOVE YOUR HEART WHILE YOU SLEEP.  SEE GENERAL SURGERY AT [facility] TO DISCUSS A NISSEN FUNDOPLICATION(WRAP YOUR STOMACH AROUND THE BOTTOM OF YOUR ESOPHAGUS).  FOLLOW UP IN 6 MOS.     Lifestyle and home remedies TO MANAGE REFLUX/CHEST PAIN  You may eliminate or reduce the frequency of heartburn by making the following lifestyle changes:  . Control your weight. Being overweight is a major risk factor for heartburn and GERD. Excess pounds put pressure on your abdomen, pushing up your stomach and causing acid to back up into your esophagus.   . Eat smaller meals. 4 TO 6 MEALS A DAY. This reduces pressure on the lower esophageal sphincter, helping to prevent the valve from opening and acid from washing back into your esophagus.   Alexander Carroll. Loosen your belt. Clothes that fit tightly around your waist put pressure on your abdomen and the lower esophageal sphincter.   . Eliminate heartburn triggers. Everyone has specific triggers. Common triggers such as fatty or fried foods, spicy food, tomato sauce, carbonated beverages, alcohol, chocolate, mint, garlic, onion, caffeine and nicotine may make heartburn worse.   Marland Kitchen. Avoid stooping or bending. Tying your shoes is OK. Bending over for longer periods to weed your garden isn't, especially soon after  eating.   . Don't lie down after a meal. Wait at least three to four hours after eating before going to bed, and don't lie down right after eating.    Alternative medicine . Several home remedies exist for treating GERD, but they provide only temporary relief. They include drinking baking soda (sodium bicarbonate) added to water or drinking other fluids such as baking soda mixed with cream of tartar and water.  . Although these liquids create temporary relief by neutralizing, washing away or buffering acids, eventually they aggravate the situation by adding gas and fluid to your stomach, increasing pressure and causing more acid reflux. Further, adding more sodium to your diet may increase your blood pressure and add stress to your heart, and excessive bicarbonate ingestion can alter the acid-base balance in your body.  . Low-Fat Diet . BREADS, CEREALS, PASTA, RICE, DRIED PEAS, AND BEANS . These products are high in carbohydrates and most are low in fat. Therefore, they can be increased in the diet as substitutes for fatty foods. They too, however, contain calories and should not be eaten in excess. Cereals can be eaten for snacks as well as for breakfast.  .  . FRUITS AND VEGETABLES . It is good to eat fruits and vegetables. Besides being sources of fiber, both are rich in vitamins and some minerals. They help you get the daily allowances of these nutrients. Fruits and  vegetables can be used for snacks and desserts. .  . MEATS . Limit lean meat, chicken, [place], and fish to no more than 6 ounces per day. . Beef, Pork, and Lamb . Use lean cuts of beef, pork, and lamb. Lean cuts include:  Marland Kitchen Extra-lean ground beef.  . Arm roast.  . Sirloin tip.  . Center-cut ham.  . Round steak.  . Loin chops.  . Rump roast.  . Tenderloin.  Alexander Carroll all fat off the outside of meats before cooking. It is not necessary to severely decrease the intake of red meat, but lean choices should be made. Lean meat is  rich in protein and contains a highly absorbable form of iron. Premenopausal women, in particular, should avoid reducing lean red meat because this could increase the risk for low red blood cells (iron-deficiency anemia). .  . Chicken and [place] . These are good sources of protein. The fat of poultry can be reduced by removing the skin and underlying fat layers before cooking. Chicken and [place] can be substituted for lean red meat in the diet. Poultry should not be fried or covered with high-fat sauces. . Fish and Shellfish . Fish is a good source of protein. Shellfish contain cholesterol, but they usually are low in saturated fatty acids. The preparation of fish is important. Like chicken and [place], they should not be fried or covered with high-fat sauces. . EGGS . Egg whites contain no fat or cholesterol. They can be eaten often. Try 1 to 2 egg whites instead of whole eggs in recipes or use egg substitutes that do not contain yolk. Marland Kitchen MILK AND DAIRY PRODUCTS . Use skim or 1% milk instead of 2% or whole milk. Decrease whole milk, natural, and processed cheeses. Use nonfat or low-fat (2%) cottage cheese or low-fat cheeses made from vegetable oils. Choose nonfat or low-fat (1 to 2%) yogurt. Experiment with evaporated skim milk in recipes that call for heavy cream. Substitute low-fat yogurt or low-fat cottage cheese for sour cream in dips and salad dressings. Have at least 2 servings of low-fat dairy products, such as 2 glasses of skim (or 1%) milk each day to help get your daily calcium intake. Marland Kitchen FATS AND OILS . Reduce the total intake of fats, especially saturated fat. Butterfat, lard, and beef fats are high in saturated fat and cholesterol. These should be avoided as much as possible. Vegetable fats do not contain cholesterol, but certain vegetable fats, such as coconut oil, palm oil, and palm kernel oil are very high in saturated fats. These should be limited. These fats are often used in bakery goods,  processed foods, popcorn, oils, and nondairy creamers. Vegetable shortenings and some peanut butters contain hydrogenated oils, which are also saturated fats. Read the labels on these foods and check for saturated vegetable oils. . Unsaturated vegetable oils and fats do not raise blood cholesterol. However, they should be limited because they are fats and are high in calories. Total fat should still be limited to 30% of your daily caloric intake. Desirable liquid vegetable oils are corn oil, cottonseed oil, olive oil, canola oil, safflower oil, soybean oil, and sunflower oil. Peanut oil is not as good, but small amounts are acceptable. Buy a heart-healthy tub margarine that has no partially hydrogenated oils in the ingredients. Mayonnaise and salad dressings often are made from unsaturated fats, but they should also be limited because of their high calorie and fat content. . Seeds, nuts, peanut butter, olives, and avocados are  high in fat, but the fat is mainly the unsaturated type. These foods should be limited mainly to avoid excess calories and fat. . OTHER EATING TIPS . Snacks  . Most sweets should be limited as snacks. They tend to be rich in calories and fats, and their caloric content outweighs their nutritional value. Some good choices in snacks are graham crackers, melba toast, soda crackers, bagels (no egg), English muffins, fruits, and vegetables. These snacks are preferable to snack crackers, [place] fries, TORTILLA CHIPS, and POTATO chips. Popcorn should be air-popped or cooked in small amounts of liquid vegetable oil. . Desserts . Eat fruit, low-fat yogurt, and fruit ices instead of pastries, cake, and cookies. Sherbet, angel food cake, gelatin dessert, frozen low-fat yogurt, or other frozen products that do not contain saturated fat (pure fruit juice bars, frozen ice pops) are also acceptable.  . COOKING METHODS . Choose those methods that use little or no fat. They include: . Poaching.   . Braising.  . Steaming.  Pascal Lux. Grilling.  . Baking.  . Stir-frying.  . Broiling.  . Microwaving.  . Foods can be cooked in a nonstick pan without added fat, or use a nonfat cooking spray in regular cookware. Limit fried foods and avoid frying in saturated fat. Add moisture to lean meats by using water, broth, cooking wines, and other nonfat or low-fat sauces along with the cooking methods mentioned above. Marland Kitchen. Soups and stews should be chilled after cooking. The fat that forms on top after a few hours in the refrigerator should be skimmed off. When preparing meals, avoid using excess salt. Salt can contribute to raising blood pressure in some people. .  . EATING AWAY FROM HOME . Order entres, potatoes, and vegetables without sauces or butter. When meat exceeds the size of a deck of cards (3 to 4 ounces), the rest can be taken home for another meal. . Choose vegetable or fruit salads and ask for low-calorie salad dressings to be served on the side. Use dressings sparingly. Limit high-fat toppings, such as bacon, crumbled eggs, cheese, sunflower seeds, and olives. Ask for heart-healthy tub margarine instead of butter.

## 2018-07-13 NOTE — Progress Notes (Signed)
ON RECALL  °

## 2018-07-13 NOTE — Progress Notes (Signed)
Subjective:    Patient ID: Alexander Carroll, male    DOB: Feb 16, 1965, 53 y.o.   MRN: [id number]  [place]   HPI Pt feels symptoms are worse. Had vomiting daily 2 weeks ago. SOMETIMES CAN FEEL NAUSEATED BUT SOMETIMES NOT. PHENERGAN HELPS WHEN HE'S HAVING THE VOMITING. [place]. Heartburn: CONTROLLED SOME AND SOME DAYS STILL HAS IT. GOES TO WORK AND HE THROWS UP WHILE HE'S AT WORK. GES MAY 2019: NL. BMs: USUALLY Q1-2 DAYS.  PT DENIES FEVER, CHILLS, HEMATOCHEZIA, HEMATEMESIS,  melena, diarrhea, CHEST PAIN, SHORTNESS OF BREATH, CHANGE IN BOWEL IN HABITS, constipation, abdominal pain, problems swallowing, OR problems with sedation.  Past Medical History:  Diagnosis Date  . GERD (gastroesophageal reflux disease)   . Hiatal hernia    SMALL   Past Surgical History:  Procedure Laterality Date  . BIOPSY  11/12/2016   Procedure: BIOPSY;  Surgeon: Danie Binder, MD;  [facility];  Service: Endoscopy;;  gastric and esophageal  . COLONOSCOPY  2018   [facility]: [facility]  . ESOPHAGOGASTRODUODENOSCOPY N/A 11/12/2016   Procedure: ESOPHAGOGASTRODUODENOSCOPY (EGD);  Surgeon: Danie Binder, MD;  [facility];  Service: Endoscopy;  Laterality: N/A;  830   . SAVORY DILATION N/A 11/12/2016   Procedure: SAVORY DILATION;  Surgeon: Danie Binder, MD;  [facility];  Service: Endoscopy;  Laterality: N/A;   Allergies  Allergen Reactions  . Penicillins Hives       Current Outpatient Medications  Medication Sig    . acetaminophen (TYLENOL) 500 MG tablet Take 500-1,000 mg by mouth every 6 (six) hours as needed for mild pain or moderate pain (for headaches.).     Marland Kitchen ondansetron (ZOFRAN) 4 MG tablet Take 1 tablet (4 mg total) by mouth 3 (three) times daily with meals as needed for nausea or vomiting.    . pantoprazole (PROTONIX) 40 MG tablet Take 1 tablet (40 mg total) by mouth 2 (two) times daily before a meal.    . loratadine-pseudoephedrine  (CLARITIN-D 12 HOUR) 5-120 MG tablet Take 1 tablet by mouth 2 (two) times daily. (Patient not taking: Reported on 01/26/2018)    . promethazine (PHENERGAN) 12.5 MG tablet 1 po 30 MINS PRIOR TO MEALS OR EVERY 4 HRS TO PREVENT nausea or vomiting     .       Review of Systems PER HPI OTHERWISE ALL SYSTEMS ARE NEGATIVE.    Objective:   Physical Exam Vitals signs reviewed.  Constitutional:      General: He is not in acute distress.    Appearance: He is well-developed.  HENT:     Head: Normocephalic and atraumatic.     Mouth/Throat:     Comments: Edentulous, mask on chin Eyes:     General: No scleral icterus.    Pupils: Pupils are equal, round, and reactive to light.  Neck:     Musculoskeletal: Normal range of motion and neck supple.  Cardiovascular:     Rate and Rhythm: Normal rate and regular rhythm.     Heart sounds: Normal heart sounds.  Pulmonary:     Effort: Pulmonary effort is normal. No respiratory distress.     Breath sounds: Normal breath sounds.  Abdominal:     General: Bowel sounds are normal. There is no distension.     Palpations: Abdomen is soft.     Tenderness: There is abdominal tenderness. There is no guarding.     Comments: MILD BLQs TTP  Lymphadenopathy:  Cervical: No cervical adenopathy.  Neurological:     Mental Status: He is alert and oriented to person, place, and time.     Comments: NO  NEW FOCAL DEFICITS  Psychiatric:     Comments: FLAT AFFECT, ANXIOUS MOOD       Assessment & Plan:

## 2018-07-13 NOTE — Assessment & Plan Note (Signed)
SYMPTOMS NOT IDEALLY CONTROLLED AND MAY EXACERBATED BY LIFESTYLE FACTORS WEIGHT STABLE IN SPITE OF COMPLAINT OF VOMITING. GES NL JUN 2019.  DISCUSSED WITH PT HE HAS REFLUX AND WHEN IT IS NOT CONTROLLED HE WILL THROW UP. IF YOU EAT THE WRONG FOOD AND A LARGE MEAL THIS WILL BE MORE LIKELY TO HAPPEN.  TO CONTROL REFLUX:   1. DRINK WATER TO KEEP YOUR URINE LIGHT YELLOW.   2. EAT 4-6 SMALL MEALS A DAY.   3. AVOID REFLUX TRIGGERS AND FOLLOW A LOW FAT DIET. AVOID FRIED FOODS. MEATS SHOULD BE BAKED, BROILED, OR BOILED.    4. CONTINUE YOUR WEIGHT LOSS EFFORTS.     5. CONTINUE PROTONIX. TAKE 30 MINUTES PRIOR TO MEALS TWICE DAILY.   6. USE PEPCID OR TAGAMET WHEN YOU HAVE BREAKTHROUGH HEARTBURN/REFLUX.   7. SLEEP WITH ON A WEDGE OR PUT THE HEAD OF YOUR BED ON 6 INCH BLOCKS TO KEEP YOUR HEAD ABOVE YOUR HEART WHILE YOU SLEEP.  SEE GENERAL SURGERY AT [facility] TO DISCUSS A NISSEN FUNDOPLICATION(WRAP YOUR STOMACH AROUND THE BOTTOM OF YOUR ESOPHAGUS).  FOLLOW UP IN 6 MOS.

## 2018-07-17 NOTE — Progress Notes (Signed)
CC'D TO PCP °

## 2018-07-23 IMAGING — DX DG CHEST 2V
2 series · 2 of 2 positions shown · non-contrast
Comparison: Chest radiograph 05/23/2013.

CLINICAL DATA: Patient with cough and nasal drainage for 1 week.

EXAM:
CHEST  2 VIEW

[chest lat]
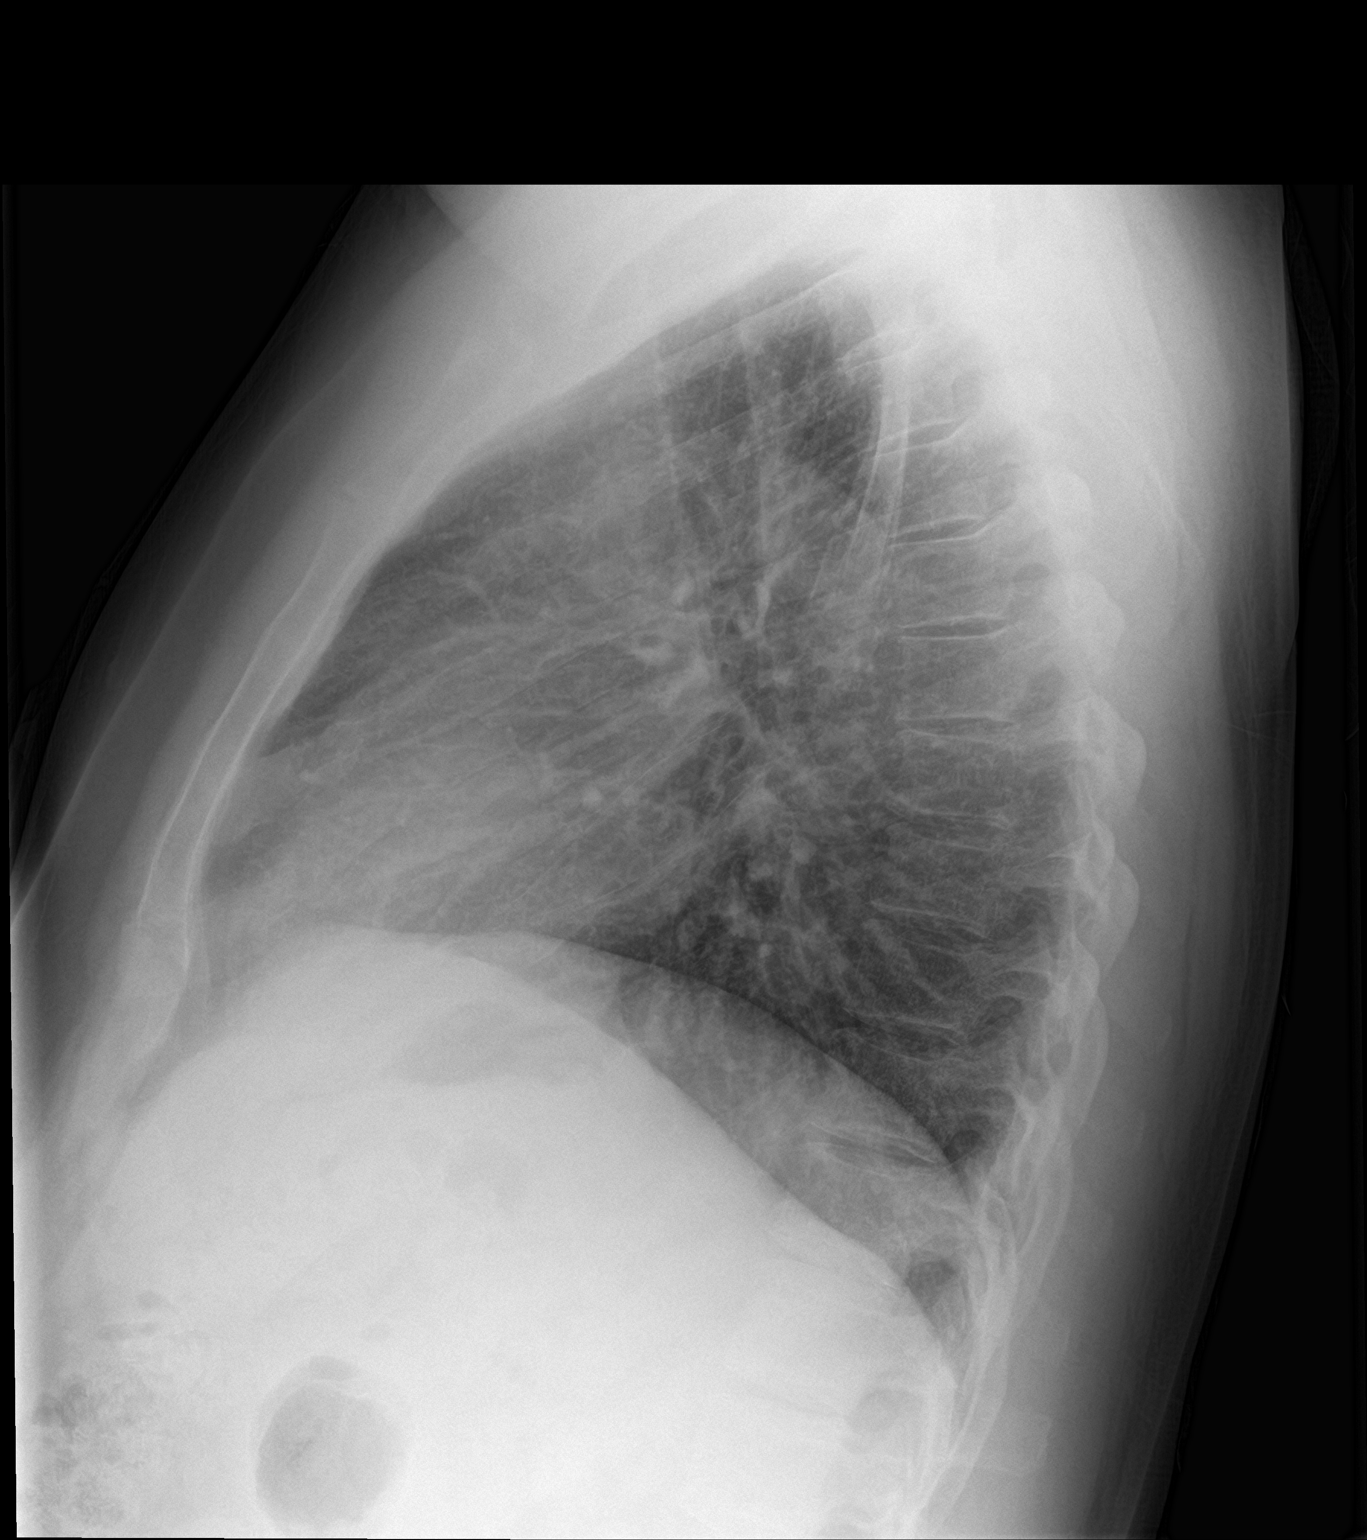

[chest pa]
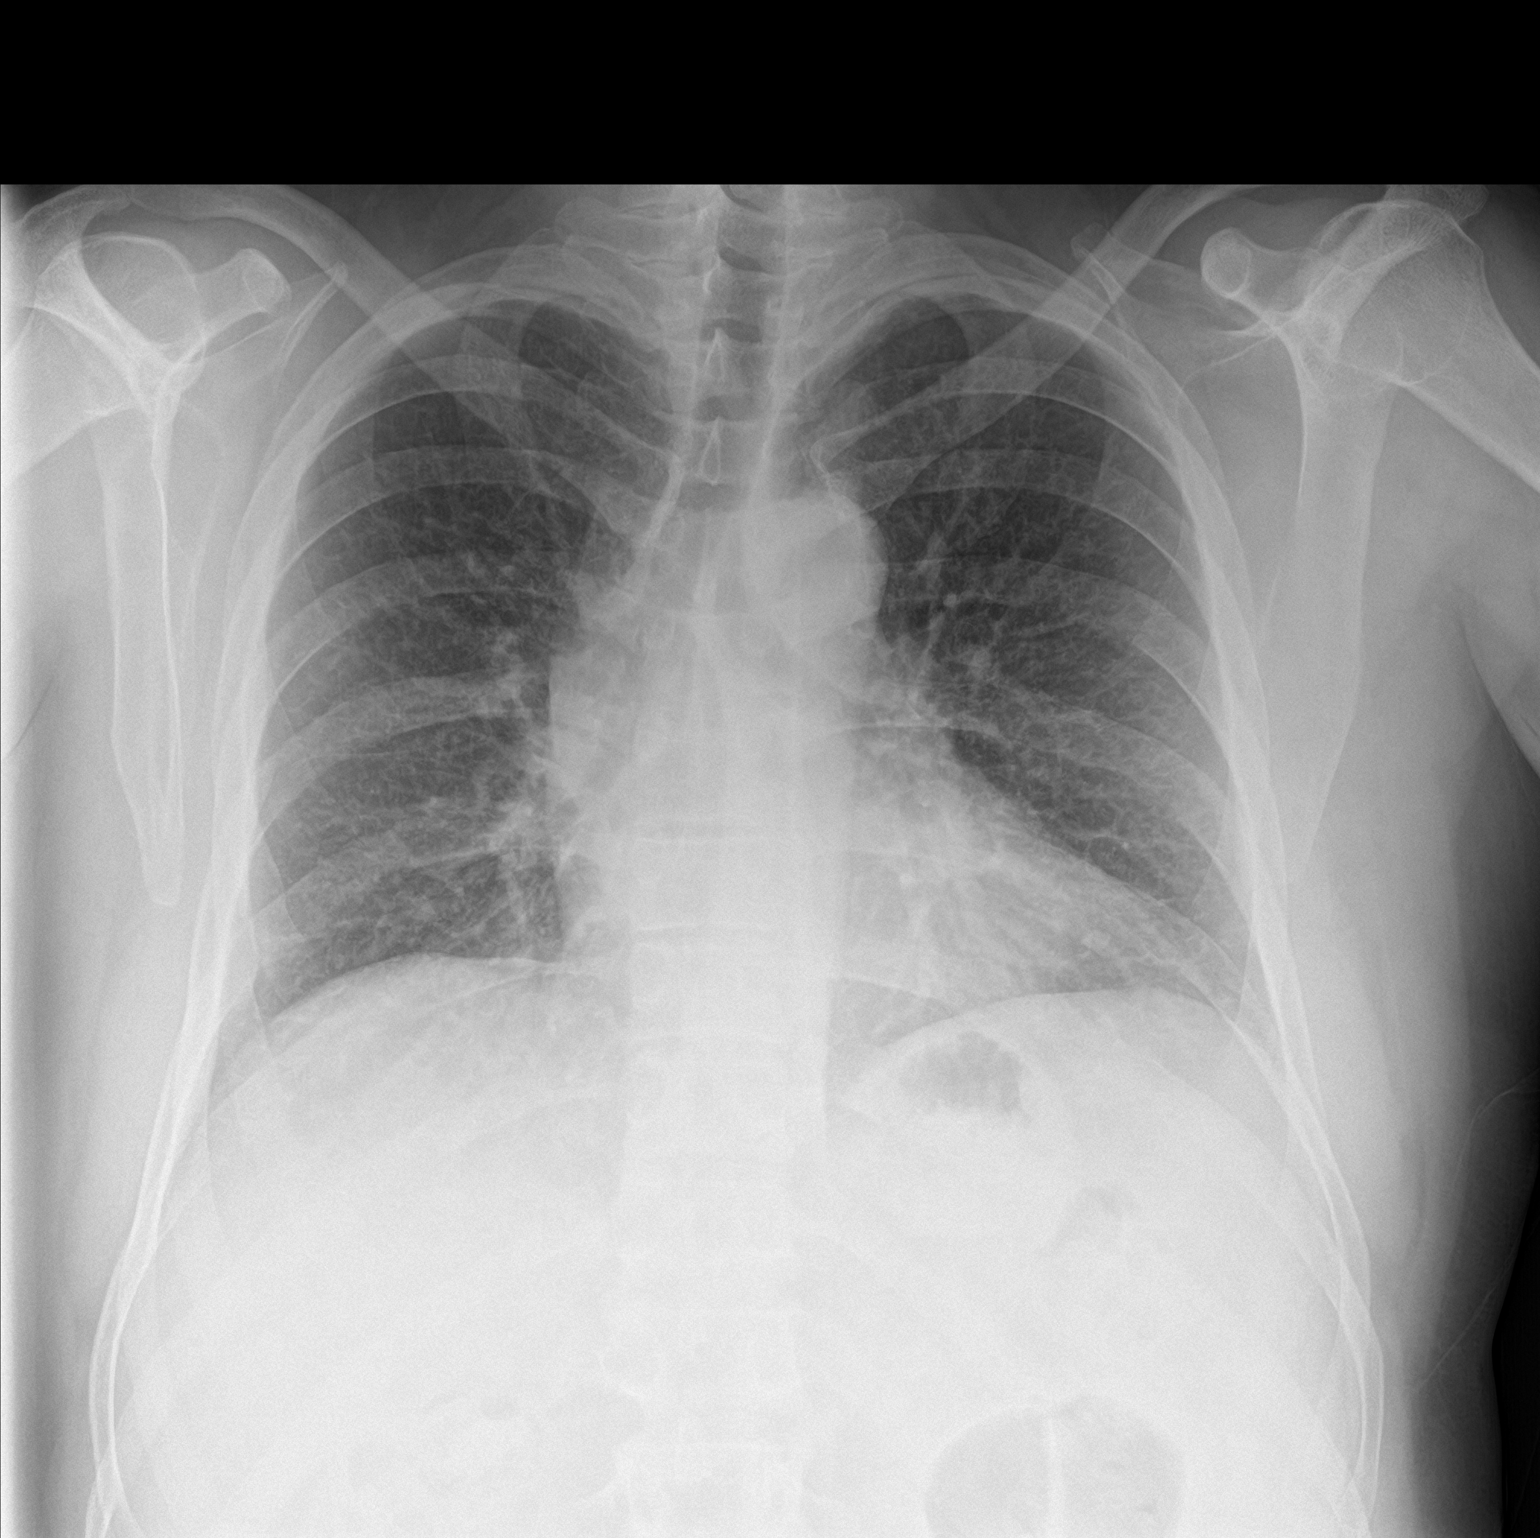

[2 of 2 positions shown; findings below may reference images not displayed]

FINDINGS: Stable cardiac and mediastinal contours. No consolidative pulmonary
opacities. No pleural effusion or pneumothorax. Thoracic spine
degenerative changes.
IMPRESSION: No active cardiopulmonary disease.

## 2018-08-17 ENCOUNTER — Encounter (HOSPITAL_COMMUNITY): Payer: Self-pay | Admitting: Emergency Medicine

## 2018-08-17 ENCOUNTER — Emergency Department (HOSPITAL_COMMUNITY): Payer: Managed Care, Other (non HMO)

## 2018-08-17 ENCOUNTER — Other Ambulatory Visit: Payer: Self-pay

## 2018-08-17 ENCOUNTER — Emergency Department (HOSPITAL_COMMUNITY)
Admission: EM | Admit: 2018-08-17 | Discharge: 2018-08-17 | Disposition: A | Discharge status: Home / Self Care | Payer: Managed Care, Other (non HMO) | Attending: Emergency Medicine | Admitting: Emergency Medicine

## 2018-08-17 DIAGNOSIS — M25512 Pain in left shoulder: Secondary | ICD-10-CM | POA: Insufficient documentation

## 2018-08-17 DIAGNOSIS — R4182 Altered mental status, unspecified: Secondary | ICD-10-CM | POA: Diagnosis present

## 2018-08-17 DIAGNOSIS — R079 Chest pain, unspecified: Secondary | ICD-10-CM | POA: Diagnosis not present

## 2018-08-17 DIAGNOSIS — R109 Unspecified abdominal pain: Secondary | ICD-10-CM | POA: Insufficient documentation

## 2018-08-17 LAB — COMPREHENSIVE METABOLIC PANEL
ALT: 37 U/L (ref 0–44)
AST: 26 U/L (ref 15–41)
Albumin: 4.5 g/dL (ref 3.5–5.0)
Alkaline Phosphatase: 79 U/L (ref 38–126)
Anion gap: 8 (ref 5–15)
BUN: 27 mg/dL — ABNORMAL HIGH (ref 6–20)
CO2: 23 mmol/L (ref 22–32)
Calcium: 9.9 mg/dL (ref 8.9–10.3)
Chloride: 106 mmol/L (ref 98–111)
Creatinine, Ser: 1.08 mg/dL (ref 0.61–1.24)
GFR calc Af Amer: >60 mL/min (ref >60)
GFR calc non Af Amer: >60 mL/min (ref >60)
Glucose, Bld: 110 mg/dL — ABNORMAL HIGH (ref 70–99)
Potassium: 3.7 mmol/L (ref 3.5–5.1)
Sodium: 137 mmol/L (ref 135–145)
Total Bilirubin: 1.2 mg/dL (ref 0.3–1.2)
Total Protein: 7.5 g/dL (ref 6.5–8.1)

## 2018-08-17 LAB — CBC WITH DIFFERENTIAL/PLATELET
Abs Immature Granulocytes: 0.01 10*3/uL (ref 0.00–0.07)
Basophils Absolute: 0 10*3/uL (ref 0.0–0.1)
Basophils Relative: 0 %
Eosinophils Absolute: 0.1 10*3/uL (ref 0.0–0.5)
Eosinophils Relative: 1 %
HCT: 43.7 % (ref 39.0–52.0)
Hemoglobin: 15.2 g/dL (ref 13.0–17.0)
Immature Granulocytes: 0 %
Lymphocytes Relative: 9 %
Lymphs Abs: 0.6 10*3/uL — ABNORMAL LOW (ref 0.7–4.0)
MCH: 29.5 pg (ref 26.0–34.0)
MCHC: 34.8 g/dL (ref 30.0–36.0)
MCV: 84.7 fL (ref 80.0–100.0)
Monocytes Absolute: 0.7 10*3/uL (ref 0.1–1.0)
Monocytes Relative: 9 %
Neutro Abs: 5.6 10*3/uL (ref 1.7–7.7)
Neutrophils Relative %: 81 %
Platelets: 174 10*3/uL (ref 150–400)
RBC: 5.16 MIL/uL (ref 4.22–5.81)
RDW: 13 % (ref 11.5–15.5)
WBC: 7 10*3/uL (ref 4.0–10.5)
nRBC: 0 % (ref 0.0–0.2)

## 2018-08-17 LAB — URINALYSIS, ROUTINE W REFLEX MICROSCOPIC
Bilirubin Urine: NEGATIVE
Glucose, UA: NEGATIVE mg/dL
Hgb urine dipstick: NEGATIVE
Ketones, ur: 5 mg/dL — AB
Leukocytes,Ua: NEGATIVE
Nitrite: NEGATIVE
Protein, ur: NEGATIVE mg/dL
Specific Gravity, Urine: >1.046 — ABNORMAL HIGH (ref 1.005–1.030)
pH: 5 (ref 5.0–8.0)

## 2018-08-17 LAB — TROPONIN I (HIGH SENSITIVITY): Troponin I (High Sensitivity): 4 ng/L (ref <18)

## 2018-08-17 LAB — LIPASE, BLOOD: Lipase: 31 U/L (ref 11–51)

## 2018-08-17 MED ORDER — LIDOCAINE VISCOUS HCL 2 % MT SOLN
15.0000 mL | Freq: Once | OROMUCOSAL | Status: AC
  Administered 2018-08-17: 14:00:00 15 mL via ORAL
  Filled 2018-08-17: qty 15

## 2018-08-17 MED ORDER — ALUM & MAG HYDROXIDE-SIMETH 200-200-20 MG/5ML PO SUSP
30.0000 mL | Freq: Once | ORAL | Status: AC
  Administered 2018-08-17: 30 mL via ORAL
  Filled 2018-08-17: qty 30

## 2018-08-17 MED ORDER — IOHEXOL 300 MG/ML  SOLN
100.0000 mL | Freq: Once | INTRAMUSCULAR | Status: AC | PRN
  Administered 2018-08-17: 100 mL via INTRAVENOUS

## 2018-08-17 MED ORDER — HYDROCODONE-ACETAMINOPHEN 5-325 MG PO TABS
1.0000 | ORAL_TABLET | Freq: Once | ORAL | Status: AC
  Administered 2018-08-17: 1 via ORAL
  Filled 2018-08-17: qty 1
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
## 2018-08-17 NOTE — Discharge Instructions (Addendum)
Apply ice packs on/off to your shoulder.  Keep your scheduled appt tomorrow with your GI provider.  You can also contact the orthopedic provider listed to arrange a follow-up appt in one week if your shoulder pain is not improving.  Return to ER for any worsening symptoms

## 2018-11-21 ENCOUNTER — Encounter: Payer: Self-pay | Admitting: Gastroenterology

## 2019-01-01 ENCOUNTER — Other Ambulatory Visit: Payer: Self-pay

## 2019-01-01 ENCOUNTER — Ambulatory Visit: Payer: Managed Care, Other (non HMO) | Attending: Internal Medicine

## 2019-01-01 DIAGNOSIS — Z20822 Contact with and (suspected) exposure to covid-19: Secondary | ICD-10-CM

## 2019-01-02 LAB — NOVEL CORONAVIRUS, NAA: SARS-CoV-2, NAA: NOT DETECTED

## 2019-01-08 ENCOUNTER — Telehealth: Payer: Self-pay | Admitting: *Deleted

## 2019-01-08 NOTE — Telephone Encounter (Signed)
Phone call to pt. To inform that the fax # for the [facility] is not correct, as the fax failed.  The pt. Stated he was at [facility]. At this time, and was trying to get assistance to get access to [place].  The pt. Stated that he could not remember his password to access MyChart.  Able to assist pt. With resetting his password.  Pt. Stated he will have the staff at [facility] to print off copy of his COVID result.  Reported he no longer need this faxed to [facility].

## 2019-01-08 NOTE — Telephone Encounter (Signed)
Alexander Carroll called in requesting a copy of her husband's COVID-19 test result be faxed to [facility].   Pt was there with her.   He is for surgery and they need that ASAP.  It can be sent to [phone] [facility].  They think this is the surgical center there.  I let them know I would have it faxed to [facility].    They thanked me for my help.    They had requested it be faxed last Monday when he got the result however [facility]ble has not received it.

## 2019-08-16 IMAGING — RF DG ESOPHAGUS
13 series · 15 of 24 positions shown · non-contrast
Comparison: None

CLINICAL DATA: Postprandial vomiting for 1 year, history of hiatal
hernia and GERD

EXAM:
ESOPHOGRAM / BARIUM SWALLOW / BARIUM TABLET STUDY
TECHNIQUE: Combined double contrast and single contrast examination performed
using effervescent crystals, thick barium liquid, and thin barium
liquid. The patient was observed with fluoroscopy swallowing a 13 mm
barium sulphate tablet.
FLUOROSCOPY TIME:  Fluoroscopy Time:  1 minutes 48 seconds
Radiation Exposure Index (if provided by the fluoroscopic device):
45.8 mGy
Number of Acquired Spot Images: multiple fluoroscopic screen
captures

[Series 1: cp_standard · 0.17mm/px · 1 of 23 frames shown (1 of 13)]
[frame 4/23]
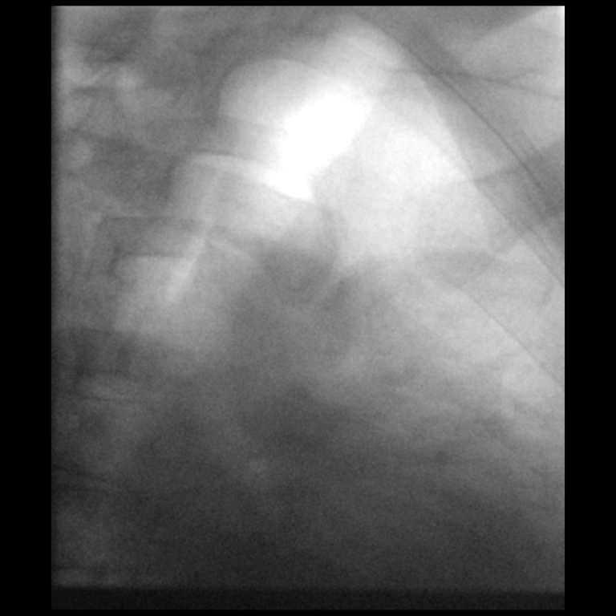

[Series 2: cp_standard · 0.17mm/px · 1 of 76 frames shown (2 of 13)]
[frame 39/76]
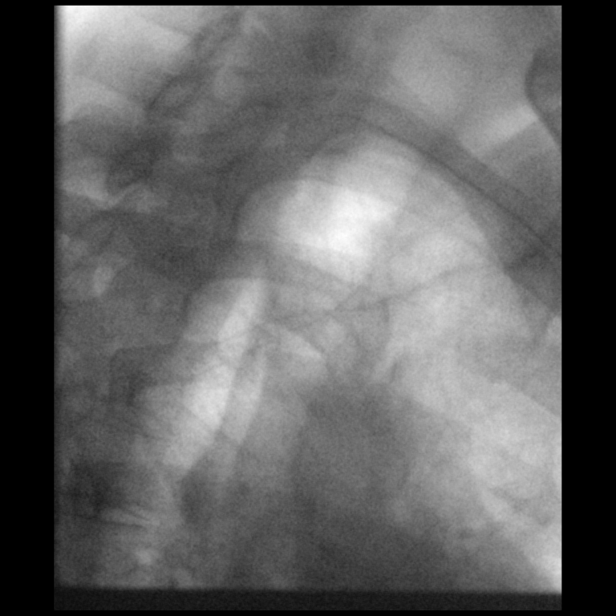

[Series 3: cp_standard · 0.17mm/px · 2 of 84 frames shown (3 of 13)]
[frame 13/84]
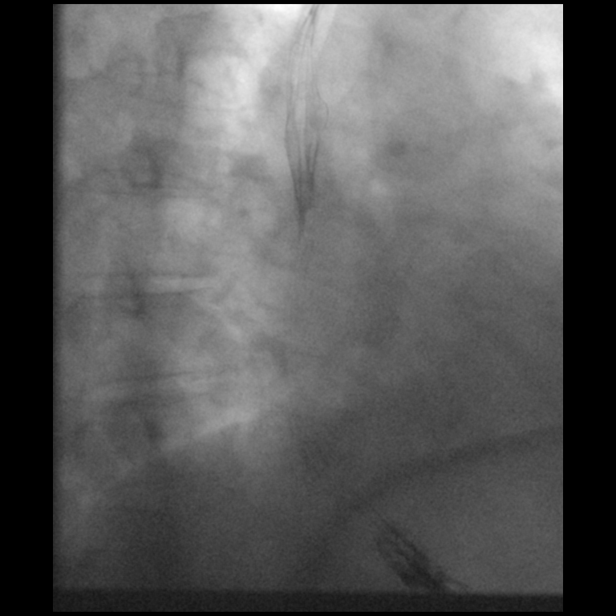
[frame 72/84]
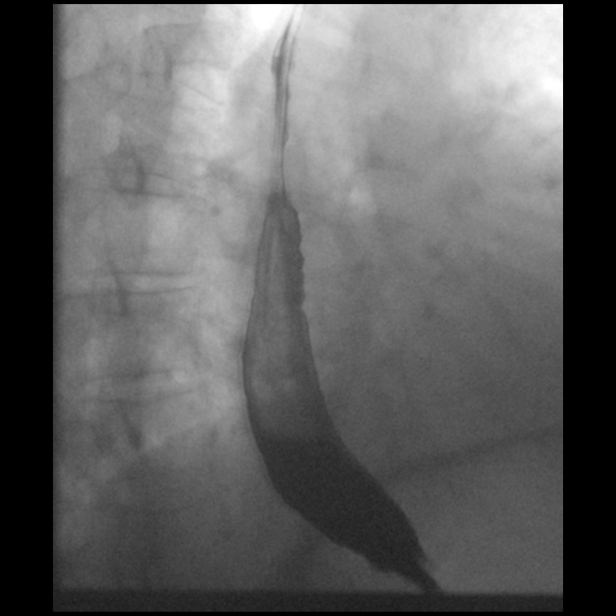

[Series 4: cp_standard · 0.18mm/px · 1 of 63 frames shown (4 of 13)]
[frame 61/63]
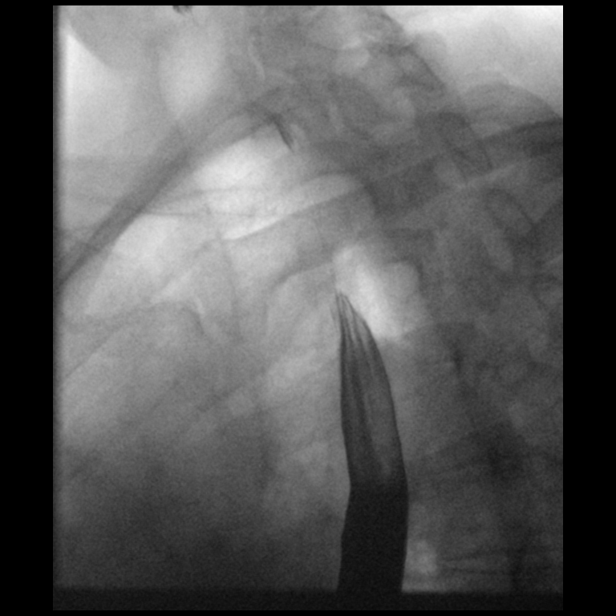

[Series 5: cp_standard · 0.18mm/px · 1 of 114 frames shown (5 of 13)]
[frame 58/114]
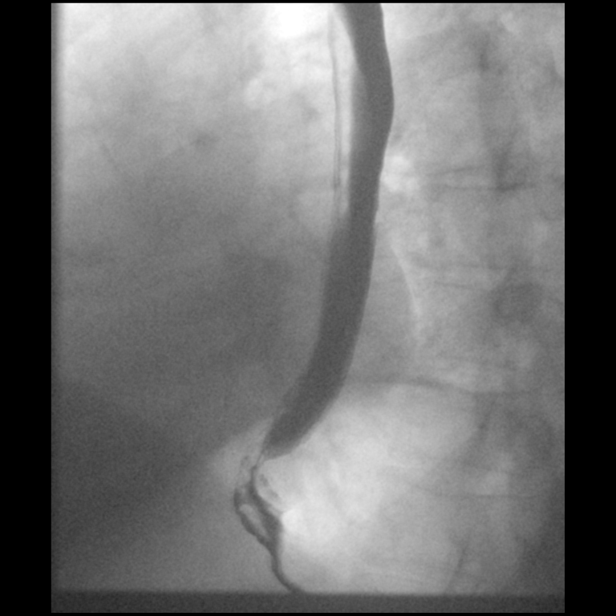

[Series 6: cp_standard · 0.26mm/px · 1 of 217 frames shown (6 of 13)]
[frame 33/217]
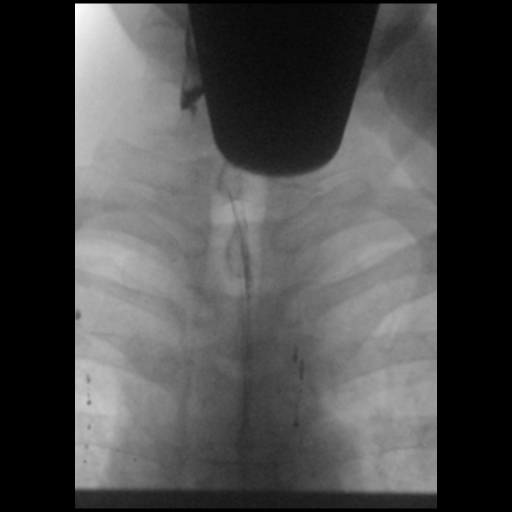

[Series 7: cp_standard · 0.26mm/px · 1 of 131 frames shown (7 of 13)]
[frame 66/131]
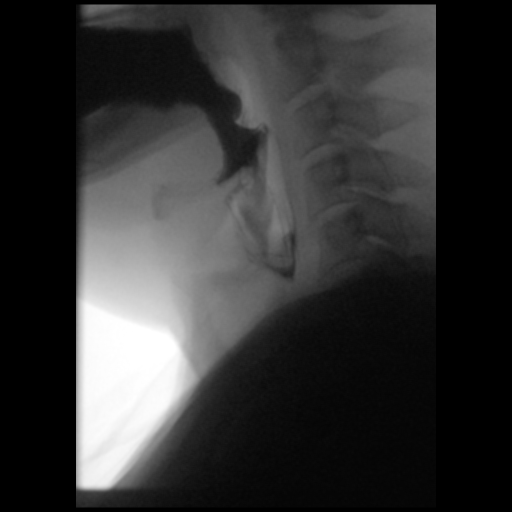

[Series 8: cp_standard · 0.26mm/px · 1 of 1 slices shown (8 of 13)]
[im 1/1]
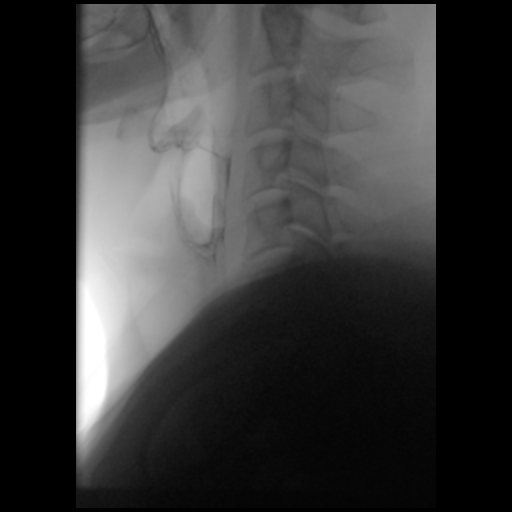

[Series 9: cp_standard · 0.17mm/px · 1 of 24 frames shown (9 of 13)]
[frame 21/24]
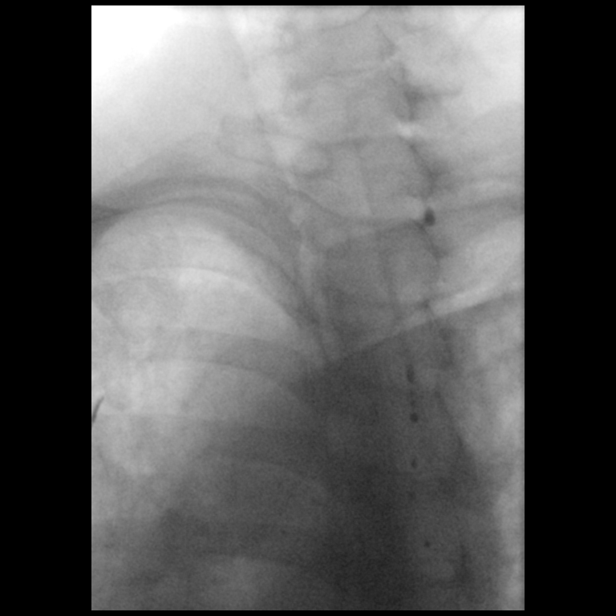

[Series 10: cp_standard · 0.17mm/px · 1 of 138 frames shown (10 of 13)]
[frame 21/138]
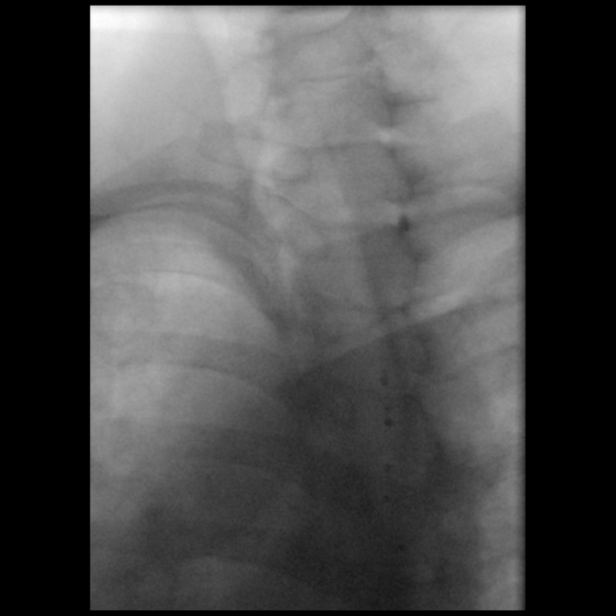

[Series 11: cp_standard · 0.18mm/px · 1 of 20 frames shown (11 of 13)]
[frame 5/20]
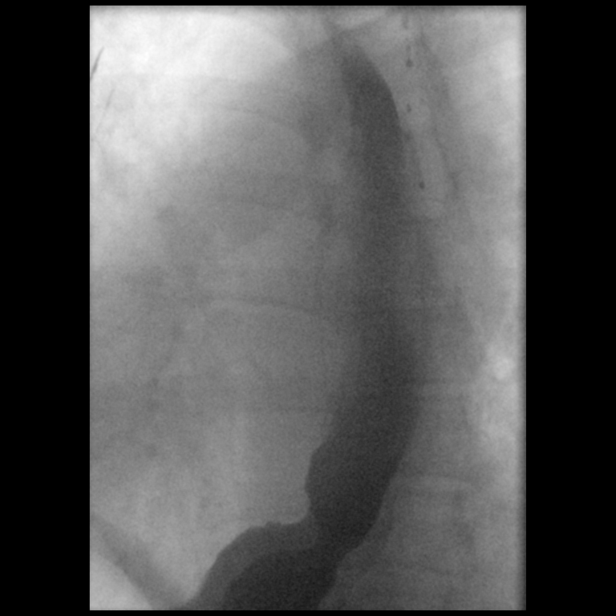

[Series 12: cp_standard · 0.18mm/px · 2 of 93 frames shown (12 of 13)]
[frame 14/93]
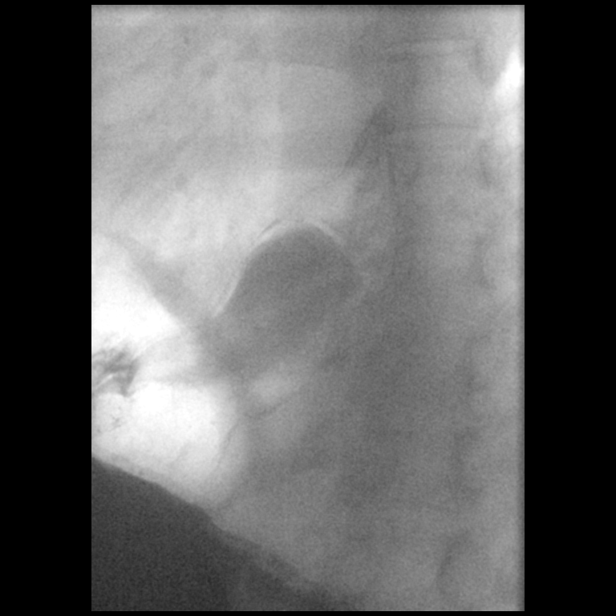
[frame 80/93]
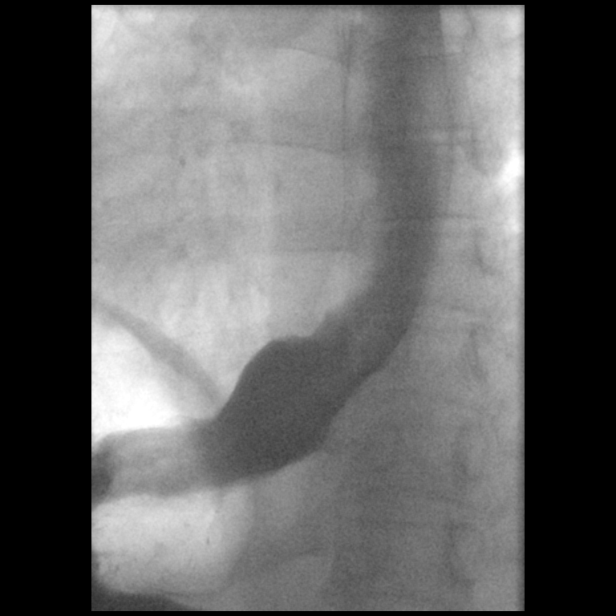

[Series 13: cp_standard · 0.18mm/px · 1 of 122 frames shown (13 of 13)]
[frame 104/122]
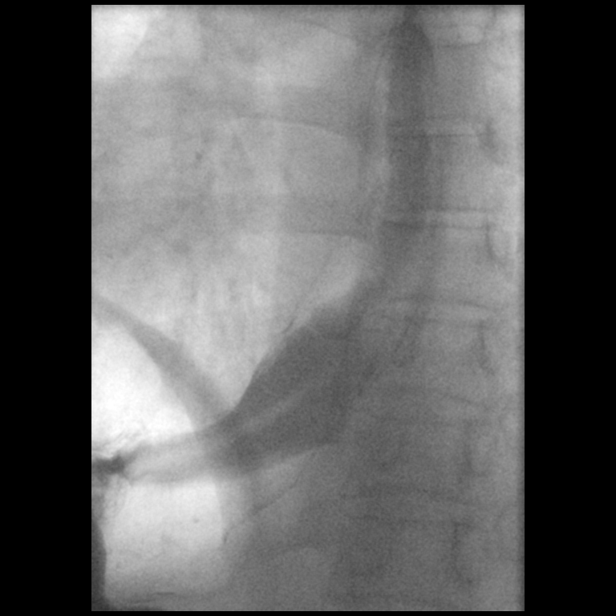

[15 of 24 positions shown; findings below may reference images not displayed]

FINDINGS: Esophageal distention: Normal distention without mass or stricture

Filling defects:  None

12.5 mm barium tablet: Rapidly passed from oral cavity to stomach
without obstruction

Motility:  Minimal esophageal dysmotility for age

Mucosa:  Smooth without irregularity or ulceration

Hypopharynx/cervical esophagus: Normal motion without laryngeal
penetration or aspiration. No residuals.

Hiatal hernia:  Small hiatal hernia

GE reflux:  Not visualized during exam

Other:  N/A
IMPRESSION: Small hiatal hernia.

Otherwise normal study.

## 2020-01-31 ENCOUNTER — Other Ambulatory Visit: Payer: Managed Care, Other (non HMO)

## 2020-01-31 ENCOUNTER — Other Ambulatory Visit: Payer: Self-pay

## 2020-01-31 DIAGNOSIS — Z20822 Contact with and (suspected) exposure to covid-19: Secondary | ICD-10-CM

## 2020-02-02 LAB — NOVEL CORONAVIRUS, NAA: SARS-CoV-2, NAA: NOT DETECTED

## 2020-02-02 LAB — SARS-COV-2, NAA 2 DAY TAT

## 2020-07-12 ENCOUNTER — Emergency Department (HOSPITAL_BASED_OUTPATIENT_CLINIC_OR_DEPARTMENT_OTHER)
Admission: EM | Admit: 2020-07-12 | Discharge: 2020-07-12 | Disposition: A | Discharge status: Home / Self Care | Payer: 59 | Attending: Emergency Medicine | Admitting: Emergency Medicine

## 2020-07-12 ENCOUNTER — Encounter (HOSPITAL_BASED_OUTPATIENT_CLINIC_OR_DEPARTMENT_OTHER): Payer: Self-pay | Admitting: Emergency Medicine

## 2020-07-12 ENCOUNTER — Other Ambulatory Visit: Payer: Self-pay

## 2020-07-12 ENCOUNTER — Emergency Department (HOSPITAL_BASED_OUTPATIENT_CLINIC_OR_DEPARTMENT_OTHER): Payer: 59

## 2020-07-12 DIAGNOSIS — K5901 Slow transit constipation: Secondary | ICD-10-CM | POA: Insufficient documentation

## 2020-07-12 DIAGNOSIS — K5900 Constipation, unspecified: Secondary | ICD-10-CM | POA: Diagnosis present

## 2020-07-12 LAB — CBC WITH DIFFERENTIAL/PLATELET
Abs Immature Granulocytes: 0.02 10*3/uL (ref 0.00–0.07)
Basophils Absolute: 0.1 10*3/uL (ref 0.0–0.1)
Basophils Relative: 1 %
Eosinophils Absolute: 0.2 10*3/uL (ref 0.0–0.5)
Eosinophils Relative: 2 %
HCT: 45.4 % (ref 39.0–52.0)
Hemoglobin: 16.1 g/dL (ref 13.0–17.0)
Immature Granulocytes: 0 %
Lymphocytes Relative: 15 %
Lymphs Abs: 1 10*3/uL (ref 0.7–4.0)
MCH: 29.9 pg (ref 26.0–34.0)
MCHC: 35.5 g/dL (ref 30.0–36.0)
MCV: 84.4 fL (ref 80.0–100.0)
Monocytes Absolute: 0.8 10*3/uL (ref 0.1–1.0)
Monocytes Relative: 11 %
Neutro Abs: 4.7 10*3/uL (ref 1.7–7.7)
Neutrophils Relative %: 71 %
Platelets: 182 10*3/uL (ref 150–400)
RBC: 5.38 MIL/uL (ref 4.22–5.81)
RDW: 12.7 % (ref 11.5–15.5)
WBC: 6.7 10*3/uL (ref 4.0–10.5)
nRBC: 0 % (ref 0.0–0.2)

## 2020-07-12 LAB — COMPREHENSIVE METABOLIC PANEL
ALT: 30 U/L (ref 0–44)
AST: 21 U/L (ref 15–41)
Albumin: 4.3 g/dL (ref 3.5–5.0)
Alkaline Phosphatase: 80 U/L (ref 38–126)
Anion gap: 6 (ref 5–15)
BUN: 23 mg/dL — ABNORMAL HIGH (ref 6–20)
CO2: 25 mmol/L (ref 22–32)
Calcium: 8.7 mg/dL — ABNORMAL LOW (ref 8.9–10.3)
Chloride: 105 mmol/L (ref 98–111)
Creatinine, Ser: 0.98 mg/dL (ref 0.61–1.24)
GFR, Estimated: >60 mL/min (ref >60)
Glucose, Bld: 100 mg/dL — ABNORMAL HIGH (ref 70–99)
Potassium: 3.9 mmol/L (ref 3.5–5.1)
Sodium: 136 mmol/L (ref 135–145)
Total Bilirubin: 0.7 mg/dL (ref 0.3–1.2)
Total Protein: 7.2 g/dL (ref 6.5–8.1)

## 2020-07-12 LAB — LIPASE, BLOOD: Lipase: 37 U/L (ref 11–51)

## 2020-07-12 MED ORDER — MAGNESIUM CITRATE PO SOLN
1.0000 | Freq: Once | ORAL | Status: AC
  Administered 2020-07-12: 1 via ORAL
  Filled 2020-07-12: qty 296

## 2020-07-12 NOTE — Discharge Instructions (Addendum)
Drink the other half of the magnesium citrate bottle when you get home. If your symptoms still do not improve you can take the MiraLAX twice a day or increase it to 2 capfuls. Make sure you are drinking plenty of fluids and incorporating the foods attached to help with moving your bowels. Follow-up with your primary care provider. Return to the ER if you continue to have worsening pain, vomiting or fever

## 2020-07-12 NOTE — ED Provider Notes (Signed)
[facility] EMERGENCY DEPARTMENT Provider Note   CSN: [id number] Arrival date & time: 07/12/20  1657     History Chief Complaint  Patient presents with   Constipation    Alexander Carroll is a 55 y.o. male with a past medical history of GERD, status post hiatal hernia repair several years ago presenting to the ED with a chief complaint of constipation.  Last normal bowel movement was 4 days ago.  Has tried 2 doses of MiraLAX and 1 dose of Dulcolax with no improvement in symptoms.  Continues to pass gas and denies any vomiting or nausea.  No fever.  Typically has a bowel movement 1-2 times a day.  No urinary symptoms or history of obstruction.  Reports pain to the middle of the abdomen that began today.  HPI      Past Medical History:  Diagnosis Date   GERD (gastroesophageal reflux disease)    Hiatal hernia    SMALL    Patient Active Problem List   Diagnosis Date Noted   Colon cancer screening 11/02/2017   GERD (gastroesophageal reflux disease) 10/04/2016   Dysphagia 10/04/2016   Chest pain 07/28/2016   Nausea and vomiting 07/28/2016    Past Surgical History:  Procedure Laterality Date   BIOPSY  11/12/2016   Procedure: BIOPSY;  Surgeon: West Bali, MD;  Location: [facility];  Service: Endoscopy;;  gastric and esophageal   COLONOSCOPY  2018   [facility]: [facility]   ESOPHAGOGASTRODUODENOSCOPY N/A 11/12/2016   Procedure: ESOPHAGOGASTRODUODENOSCOPY (EGD);  Surgeon: West Bali, MD;  Location: [facility];  Service: Endoscopy;  Laterality: N/A;  830    SAVORY DILATION N/A 11/12/2016   Procedure: SAVORY DILATION;  Surgeon: West Bali, MD;  Location: [facility];  Service: Endoscopy;  Laterality: N/A;       Family History  Problem Relation Age of Onset   Kidney failure Mother    Leukemia Father    Colon cancer Neg Hx    Gastric cancer Neg Hx    Esophageal cancer Neg Hx     Social History   Tobacco Use   Smoking status: Never   Smokeless  tobacco: Never  Vaping Use   Vaping Use: Never used  Substance Use Topics   Alcohol use: Not Currently   Drug use: No    Home Medications Prior to Admission medications   Medication Sig Start Date End Date Taking? Authorizing Provider  acetaminophen (TYLENOL) 500 MG tablet Take 500-1,000 mg by mouth every 6 (six) hours as needed for mild pain or moderate pain (for headaches.).     [provider]  loratadine-pseudoephedrine (CLARITIN-D 12 HOUR) 5-120 MG tablet Take 1 tablet by mouth 2 (two) times daily. Patient not taking: Reported on 01/26/2018 02/17/17   Ivery Quale, PA-C  ondansetron (ZOFRAN) 4 MG tablet Take 1 tablet (4 mg total) by mouth 3 (three) times daily with meals as needed for nausea or vomiting. 06/02/17   Anice Paganini, NP  pantoprazole (PROTONIX) 40 MG tablet Take 1 tablet (40 mg total) by mouth 2 (two) times daily before a meal. 04/01/17   Anice Paganini, NP  promethazine (PHENERGAN) 12.5 MG tablet 1 po 30 MINS PRIOR TO MEALS OR EVERY 4 HRS TO PREVENT nausea or vomiting Patient not taking: Reported on 11/02/2017 08/25/17   West Bali, MD  sucralfate (CARAFATE) 1 GM/10ML suspension Take 10 mLs (1 g total) by mouth 4 (four) times daily -  with meals and  at bedtime. Patient not taking: Reported on 08/25/2017 06/20/17   Liberty Handy, PA-C    Allergies    Penicillins  Review of Systems   Review of Systems  Constitutional:  Negative for appetite change, chills and fever.  HENT:  Negative for ear pain, rhinorrhea, sneezing and sore throat.   Eyes:  Negative for photophobia and visual disturbance.  Respiratory:  Negative for cough, chest tightness, shortness of breath and wheezing.   Cardiovascular:  Negative for chest pain and palpitations.  Gastrointestinal:  Positive for abdominal pain and constipation. Negative for blood in stool, diarrhea, nausea and vomiting.  Genitourinary:  Negative for dysuria, hematuria and urgency.  Musculoskeletal:  Negative for  myalgias.  Skin:  Negative for rash.  Neurological:  Negative for dizziness, weakness and light-headedness.   Physical Exam Updated Vital Signs BP 135/89 (BP Location: Right Arm)   Pulse 78   Temp 98.1 F (36.7 C) (Oral)   Resp 18   Ht 6\' 2"  (1.88 m)   Wt 115.7 kg   SpO2 96%   BMI 32.74 kg/m   Physical Exam Vitals and nursing note reviewed.  Constitutional:      General: He is not in acute distress.    Appearance: He is well-developed.  HENT:     Head: Normocephalic and atraumatic.     Nose: Nose normal.  Eyes:     General: No scleral icterus.       Left eye: No discharge.     Conjunctiva/sclera: Conjunctivae normal.  Cardiovascular:     Rate and Rhythm: Normal rate and regular rhythm.     Heart sounds: Normal heart sounds. No murmur heard.   No friction rub. No gallop.  Pulmonary:     Effort: Pulmonary effort is normal. No respiratory distress.     Breath sounds: Normal breath sounds.  Abdominal:     General: Bowel sounds are normal. There is no distension.     Palpations: Abdomen is soft.     Tenderness: There is abdominal tenderness (Periumbilical). There is no guarding.  Musculoskeletal:        General: Normal range of motion.     Cervical back: Normal range of motion and neck supple.  Skin:    General: Skin is warm and dry.     Findings: No rash.  Neurological:     Mental Status: He is alert.     Motor: No abnormal muscle tone.     Coordination: Coordination normal.    ED Results / Procedures / Treatments   Labs (all labs ordered are listed, but only abnormal results are displayed) Labs Reviewed  COMPREHENSIVE METABOLIC PANEL - Abnormal; Notable for the following components:      Result Value   Glucose, Bld 100 (*)    BUN 23 (*)    Calcium 8.7 (*)    All other components within normal limits  CBC WITH DIFFERENTIAL/PLATELET  LIPASE, BLOOD    EKG None  Radiology DG Abdomen 1 View  Result Date: 07/12/2020 CLINICAL DATA:  Constipation. EXAM:  ABDOMEN - 1 VIEW COMPARISON:  CT AP 08/17/18 FINDINGS: No dilated bowel loops identified. Moderate stool burden noted within the colon. Retained enteric contrast material noted in scattered left-sided colonic diverticula. IMPRESSION: Nonobstructive bowel gas pattern. Electronically Signed   By: 10/17/18 M.D.   On: 07/12/2020 18:28    Procedures Procedures   Medications Ordered in ED Medications  magnesium citrate solution 1 Bottle (has no administration in time range)  ED Course  I have reviewed the triage vital signs and the nursing notes.  Pertinent labs & imaging results that were available during my care of the patient were reviewed by me and considered in my medical decision making (see chart for details).  Clinical Course as of 07/12/20 1938  Sat Jul 12, 2020  1908 WBC: 6.7 [HK]    Clinical Course User Index [HK] Dietrich Pates, PA-C   MDM Rules/Calculators/A&P                          55 year old male presenting to the ED with a chief complaint of constipation.  Has not had a bowel movement in 4 days.  He continues to pass gas and denies any vomiting or nausea.  Took 2 doses of MiraLAX and 1 dose of Dulcolax without improvement in symptoms.  Reports periumbilical abdominal pain.  Typically has a bowel movement 1-2 times a day.  On exam he has tenderness palpation of the periumbilical region without rebound or guarding.  He is afebrile here.  Will obtain lab work and imaging studies.  Lab work including CBC, CMP lipase unremarkable.  Abdominal x-ray shows nonobstructive bowel gas pattern with moderate amount of stool noted. No increased stool in the rectum noted.  Will give magnesium citrate to help with his constipation.  Advised him to increase his hydration and incorporate foods that will help with constipation as well.  Advised him that he can increase frequency of the MiraLAX as well to twice a day or 2 capfuls.  I suspect that his abdominal pain is due to his constipation.   No evidence of obstruction today.  We will have him follow-up with primary care provider and return for worsening symptoms.  All imaging, if done today, including plain films, CT scans, and ultrasounds, independently reviewed by me, and interpretations confirmed via formal radiology reads.  Patient is hemodynamically stable, in NAD, and able to ambulate in the ED. Evaluation does not show pathology that would require ongoing emergent intervention or inpatient treatment. I explained the diagnosis to the patient. Pain has been managed and has no complaints prior to discharge. Patient is comfortable with above plan and is stable for discharge at this time. All questions were answered prior to disposition. Strict return precautions for returning to the ED were discussed. Encouraged follow up with PCP.   An After Visit Summary was printed and given to the patient.   Portions of this note were generated with Scientist, clinical (histocompatibility and immunogenetics). Dictation errors may occur despite best attempts at proofreading.  Final Clinical Impression(s) / ED Diagnoses Final diagnoses:  Slow transit constipation    Rx / DC Orders ED Discharge Orders     None        Dietrich Pates, PA-C 07/12/20 1939    Maia Plan, MD 07/15/20 [phone]

## 2020-07-12 NOTE — ED Triage Notes (Signed)
Pt reports no BM x 4d; c/o umbilical pain

## 2020-08-23 ENCOUNTER — Encounter (HOSPITAL_BASED_OUTPATIENT_CLINIC_OR_DEPARTMENT_OTHER): Payer: Self-pay | Admitting: Emergency Medicine

## 2020-08-23 ENCOUNTER — Emergency Department (HOSPITAL_BASED_OUTPATIENT_CLINIC_OR_DEPARTMENT_OTHER)
Admission: EM | Admit: 2020-08-23 | Discharge: 2020-08-23 | Disposition: A | Discharge status: Home / Self Care | Payer: 59 | Attending: Emergency Medicine | Admitting: Emergency Medicine

## 2020-08-23 ENCOUNTER — Other Ambulatory Visit: Payer: Self-pay

## 2020-08-23 ENCOUNTER — Emergency Department (HOSPITAL_BASED_OUTPATIENT_CLINIC_OR_DEPARTMENT_OTHER): Payer: 59

## 2020-08-23 DIAGNOSIS — N644 Mastodynia: Secondary | ICD-10-CM | POA: Diagnosis present

## 2020-08-23 NOTE — Discharge Instructions (Addendum)
You were seen in the emergency department for evaluation of right-sided breast pain.  There was no obvious mass on exam.  Your chest x-ray did not show any obvious findings.  Please contact the breast center to schedule an evaluation.  You can use Tylenol and ibuprofen for pain.  Warm compress.  Follow-up with your regular doctor.  Return to the emergency department if any worsening or concerning symptoms

## 2020-08-23 NOTE — ED Provider Notes (Signed)
[facility] EMERGENCY DEPARTMENT Provider Note   CSN: [id number] Arrival date & time: 08/23/20  5427     History Chief Complaint  Patient presents with   Breast Pain    Alexander Carroll is a 55 y.o. male.  He is complaining of 1 week of right breast pain.  He does not recall any trauma.  Its worse with movement and palpation.  He does not have any shortness of breath.  He feels its more swollen.  There is been no drainage.  No prior history of same.  No recent medication changes.  The history is provided by the patient.  Chest Pain Pain location:  R chest Pain quality: aching   Pain radiates to:  Does not radiate Pain severity:  Moderate Onset quality:  Gradual Duration:  1 week Progression:  Unchanged Chronicity:  New Context: movement and raising an arm   Relieved by: tylenol. Worsened by:  Certain positions and movement Ineffective treatments:  None tried Associated symptoms: no abdominal pain, no back pain, no cough, no fever, no headache, no nausea, no shortness of breath and no vomiting       Past Medical History:  Diagnosis Date   GERD (gastroesophageal reflux disease)    Hiatal hernia    SMALL    Patient Active Problem List   Diagnosis Date Noted   Colon cancer screening 11/02/2017   GERD (gastroesophageal reflux disease) 10/04/2016   Dysphagia 10/04/2016   Chest pain 07/28/2016   Nausea and vomiting 07/28/2016    Past Surgical History:  Procedure Laterality Date   BIOPSY  11/12/2016   Procedure: BIOPSY;  Surgeon: West Bali, MD;  Location: [facility];  Service: Endoscopy;;  gastric and esophageal   COLONOSCOPY  2018   [facility]: [facility]   ESOPHAGOGASTRODUODENOSCOPY N/A 11/12/2016   Procedure: ESOPHAGOGASTRODUODENOSCOPY (EGD);  Surgeon: West Bali, MD;  Location: [facility];  Service: Endoscopy;  Laterality: N/A;  830    SAVORY DILATION N/A 11/12/2016   Procedure: SAVORY DILATION;  Surgeon: West Bali, MD;  Location: [facility];  Service: Endoscopy;  Laterality: N/A;       Family History  Problem Relation Age of Onset   Kidney failure Mother    Leukemia Father    Colon cancer Neg Hx    Gastric cancer Neg Hx    Esophageal cancer Neg Hx     Social History   Tobacco Use   Smoking status: Never   Smokeless tobacco: Never  Vaping Use   Vaping Use: Never used  Substance Use Topics   Alcohol use: Not Currently   Drug use: No    Home Medications Prior to Admission medications   Medication Sig Start Date End Date Taking? Authorizing Provider  acetaminophen (TYLENOL) 500 MG tablet Take 500-1,000 mg by mouth every 6 (six) hours as needed for mild pain or moderate pain (for headaches.).     [provider]  loratadine-pseudoephedrine (CLARITIN-D 12 HOUR) 5-120 MG tablet Take 1 tablet by mouth 2 (two) times daily. Patient not taking: Reported on 01/26/2018 02/17/17   Ivery Quale, PA-C  ondansetron (ZOFRAN) 4 MG tablet Take 1 tablet (4 mg total) by mouth 3 (three) times daily with meals as needed for nausea or vomiting. 06/02/17   Anice Paganini, NP  pantoprazole (PROTONIX) 40 MG tablet Take 1 tablet (40 mg total) by mouth 2 (two) times daily before a meal. 04/01/17   Anice Paganini, NP  promethazine (PHENERGAN) 12.5  MG tablet 1 po 30 MINS PRIOR TO MEALS OR EVERY 4 HRS TO PREVENT nausea or vomiting Patient not taking: Reported on 11/02/2017 08/25/17   West Bali, MD  sucralfate (CARAFATE) 1 GM/10ML suspension Take 10 mLs (1 g total) by mouth 4 (four) times daily -  with meals and at bedtime. Patient not taking: Reported on 08/25/2017 06/20/17   Liberty Handy, PA-C    Allergies    Penicillins  Review of Systems   Review of Systems  Constitutional:  Negative for fever.  HENT:  Negative for sore throat.   Eyes:  Negative for visual disturbance.  Respiratory:  Negative for cough and shortness of breath.   Cardiovascular:  Positive for chest pain.  Gastrointestinal:  Negative for abdominal  pain, nausea and vomiting.  Genitourinary:  Negative for dysuria.  Musculoskeletal:  Negative for back pain.  Skin:  Negative for rash.  Neurological:  Negative for headaches.   Physical Exam Updated Vital Signs BP (!) 143/102 (BP Location: Right Arm)   Pulse 74   Temp 98 F (36.7 C) (Oral)   Resp 18   Ht 6\' 2"  (1.88 m)   Wt 113.4 kg   SpO2 99%   BMI 32.10 kg/m   Physical Exam Vitals and nursing note reviewed.  Constitutional:      Appearance: Normal appearance. He is well-developed.  HENT:     Head: Normocephalic and atraumatic.  Eyes:     Conjunctiva/sclera: Conjunctivae normal.  Cardiovascular:     Rate and Rhythm: Normal rate and regular rhythm.     Heart sounds: No murmur heard. Pulmonary:     Effort: Pulmonary effort is normal. No respiratory distress.     Breath sounds: Normal breath sounds.  Chest:  Breasts:    Right: Tenderness present. No bleeding, mass, nipple discharge or skin change.     Left: Normal.     Comments: Patient has diffuse tenderness around right breast.  I do not appreciate any masses.  No axillary adenopathy.  No overlying skin changes.  No nipple discharge. Abdominal:     Palpations: Abdomen is soft.     Tenderness: There is no abdominal tenderness.  Musculoskeletal:     Cervical back: Neck supple.  Skin:    General: Skin is warm and dry.  Neurological:     Mental Status: He is alert.    ED Results / Procedures / Treatments   Labs (all labs ordered are listed, but only abnormal results are displayed) Labs Reviewed - No data to display  EKG None  Radiology DG Chest 2 View  Result Date: 08/23/2020 CLINICAL DATA:  Right breast pain EXAM: CHEST - 2 VIEW COMPARISON:  None. FINDINGS: The heart size and mediastinal contours are within normal limits. Low lung volumes with streaky bibasilar opacities, favor atelectasis. No pleural effusion or pneumothorax. The visualized skeletal structures are unremarkable. IMPRESSION: Low lung volumes  with streaky bibasilar opacities, favor atelectasis. Electronically Signed   By: 08/25/2020 D.O.   On: 08/23/2020 17:24    Procedures Procedures   Medications Ordered in ED Medications - No data to display  ED Course  I have reviewed the triage vital signs and the nursing notes.  Pertinent labs & imaging results that were available during my care of the patient were reviewed by me and considered in my medical decision making (see chart for details).  Clinical Course as of 08/24/20 1010  Sat Aug 23, 2020  1728 Chest x-ray interpreted by me as  poor respiratory effort no acute infiltrates.  Radiology read as bibasilar atelectasis. [MB]    Clinical Course User Index [MB] Terrilee Files, MD   MDM Rules/Calculators/A&P                           55 year old male with right breast pain.  Reproducibly tender on exam.  No masses appreciated no evidence of infection.  Chest x-ray does not show any acute masses in this area.  He will need further evaluation by imaging and PCP follow-up.  Return instructions discussed. Final Clinical Impression(s) / ED Diagnoses Final diagnoses:  Breast pain, right    Rx / DC Orders ED Discharge Orders     None        Terrilee Files, MD 08/24/20 1011

## 2020-08-23 NOTE — ED Triage Notes (Signed)
Pt arrives pov with c/o R breast pain x 1 week. Pt denies CP, denies shob, denies injury. Pt endorses tenderness and swelling

## 2020-08-25 ENCOUNTER — Telehealth (HOSPITAL_BASED_OUTPATIENT_CLINIC_OR_DEPARTMENT_OTHER): Payer: Self-pay | Admitting: Emergency Medicine

## 2020-08-25 ENCOUNTER — Other Ambulatory Visit (HOSPITAL_BASED_OUTPATIENT_CLINIC_OR_DEPARTMENT_OTHER): Payer: Self-pay | Admitting: Emergency Medicine

## 2020-08-25 DIAGNOSIS — N644 Mastodynia: Secondary | ICD-10-CM

## 2020-08-25 NOTE — Telephone Encounter (Signed)
Patient was referred to breast center but no ultrasound was ordered. This order has placed.

## 2020-08-25 NOTE — Telephone Encounter (Signed)
Added bilateral order

## 2020-09-25 ENCOUNTER — Ambulatory Visit: Admission: RE | Admit: 2020-09-25 | Payer: Managed Care, Other (non HMO) | Source: Ambulatory Visit

## 2020-09-25 ENCOUNTER — Other Ambulatory Visit: Payer: Self-pay

## 2020-09-25 ENCOUNTER — Ambulatory Visit
Admission: RE | Admit: 2020-09-25 | Discharge: 2020-09-25 | Disposition: A | Discharge status: Home / Self Care | Payer: 59 | Source: Ambulatory Visit | Attending: Emergency Medicine | Admitting: Emergency Medicine

## 2020-09-25 DIAGNOSIS — N644 Mastodynia: Secondary | ICD-10-CM

## 2021-05-04 ENCOUNTER — Telehealth: Payer: Self-pay | Admitting: Specialist

## 2021-05-04 NOTE — Telephone Encounter (Signed)
Please call the pt --regarding questions -- ?Didn't want to tell any details ?

## 2021-05-07 NOTE — Telephone Encounter (Signed)
I called, His wife Angelique Blonder is having surgery with Dr. Otelia Sergeant and is wanting to get FMLA forms completed, I advised that he would need to bring the forms along with $25 cash or check, and he would need to complete the paperwork for [employer] to do the forms. ?

## 2021-06-23 ENCOUNTER — Telehealth: Payer: Self-pay | Admitting: Specialist

## 2021-06-23 NOTE — Telephone Encounter (Signed)
I called and he asked if I had faxed the note to his employer, I advised that I faxed it at 2:54pm on 06/22/21.

## 2021-06-23 NOTE — Telephone Encounter (Signed)
Pt called and would not leave a reason she need a call back. Pt asking for Christy to call at [phone].

## 2022-02-25 ENCOUNTER — Emergency Department (HOSPITAL_COMMUNITY): Payer: 59

## 2022-02-25 ENCOUNTER — Other Ambulatory Visit: Payer: Self-pay

## 2022-02-25 ENCOUNTER — Encounter (HOSPITAL_COMMUNITY): Payer: Self-pay | Admitting: Emergency Medicine

## 2022-02-25 ENCOUNTER — Inpatient Hospital Stay (HOSPITAL_COMMUNITY)
Admission: EM | Admit: 2022-02-25 | Discharge: 2022-02-27 | DRG: 880 | Disposition: A | Discharge status: Home / Self Care | Payer: 59 | Attending: Neurology | Admitting: Neurology

## 2022-02-25 DIAGNOSIS — R4782 Fluency disorder in conditions classified elsewhere: Secondary | ICD-10-CM | POA: Diagnosis present

## 2022-02-25 DIAGNOSIS — Z841 Family history of disorders of kidney and ureter: Secondary | ICD-10-CM

## 2022-02-25 DIAGNOSIS — E669 Obesity, unspecified: Secondary | ICD-10-CM | POA: Diagnosis present

## 2022-02-25 DIAGNOSIS — F446 Conversion disorder with sensory symptom or deficit: Principal | ICD-10-CM | POA: Diagnosis present

## 2022-02-25 DIAGNOSIS — Z806 Family history of leukemia: Secondary | ICD-10-CM | POA: Diagnosis not present

## 2022-02-25 DIAGNOSIS — E785 Hyperlipidemia, unspecified: Secondary | ICD-10-CM | POA: Diagnosis present

## 2022-02-25 DIAGNOSIS — Z880 Allergy status to penicillin: Secondary | ICD-10-CM | POA: Diagnosis not present

## 2022-02-25 DIAGNOSIS — I6389 Other cerebral infarction: Secondary | ICD-10-CM | POA: Diagnosis not present

## 2022-02-25 DIAGNOSIS — R4701 Aphasia: Secondary | ICD-10-CM | POA: Diagnosis present

## 2022-02-25 DIAGNOSIS — R490 Dysphonia: Secondary | ICD-10-CM | POA: Diagnosis present

## 2022-02-25 DIAGNOSIS — Z789 Other specified health status: Secondary | ICD-10-CM | POA: Diagnosis not present

## 2022-02-25 DIAGNOSIS — Z6832 Body mass index (BMI) 32.0-32.9, adult: Secondary | ICD-10-CM | POA: Diagnosis not present

## 2022-02-25 DIAGNOSIS — I119 Hypertensive heart disease without heart failure: Secondary | ICD-10-CM | POA: Diagnosis present

## 2022-02-25 DIAGNOSIS — R471 Dysarthria and anarthria: Secondary | ICD-10-CM | POA: Diagnosis present

## 2022-02-25 DIAGNOSIS — R339 Retention of urine, unspecified: Secondary | ICD-10-CM | POA: Diagnosis not present

## 2022-02-25 DIAGNOSIS — K219 Gastro-esophageal reflux disease without esophagitis: Secondary | ICD-10-CM | POA: Diagnosis present

## 2022-02-25 DIAGNOSIS — F444 Conversion disorder with motor symptom or deficit: Secondary | ICD-10-CM | POA: Diagnosis not present

## 2022-02-25 DIAGNOSIS — F439 Reaction to severe stress, unspecified: Secondary | ICD-10-CM | POA: Diagnosis present

## 2022-02-25 DIAGNOSIS — F449 Dissociative and conversion disorder, unspecified: Secondary | ICD-10-CM | POA: Diagnosis not present

## 2022-02-25 DIAGNOSIS — R531 Weakness: Secondary | ICD-10-CM | POA: Diagnosis present

## 2022-02-25 DIAGNOSIS — I639 Cerebral infarction, unspecified: Secondary | ICD-10-CM | POA: Diagnosis present

## 2022-02-25 DIAGNOSIS — Z79899 Other long term (current) drug therapy: Secondary | ICD-10-CM

## 2022-02-25 DIAGNOSIS — I6300 Cerebral infarction due to thrombosis of unspecified precerebral artery: Secondary | ICD-10-CM | POA: Diagnosis not present

## 2022-02-25 LAB — DIFFERENTIAL
Abs Immature Granulocytes: 0.02 10*3/uL (ref 0.00–0.07)
Basophils Absolute: 0.1 10*3/uL (ref 0.0–0.1)
Basophils Relative: 1 %
Eosinophils Absolute: 0.3 10*3/uL (ref 0.0–0.5)
Eosinophils Relative: 5 %
Immature Granulocytes: 0 %
Lymphocytes Relative: 16 %
Lymphs Abs: 0.9 10*3/uL (ref 0.7–4.0)
Monocytes Absolute: 0.8 10*3/uL (ref 0.1–1.0)
Monocytes Relative: 15 %
Neutro Abs: 3.2 10*3/uL (ref 1.7–7.7)
Neutrophils Relative %: 63 %

## 2022-02-25 LAB — COMPREHENSIVE METABOLIC PANEL
ALT: 38 U/L (ref 0–44)
AST: 27 U/L (ref 15–41)
Albumin: 4.3 g/dL (ref 3.5–5.0)
Alkaline Phosphatase: 76 U/L (ref 38–126)
Anion gap: 11 (ref 5–15)
BUN: 17 mg/dL (ref 6–20)
CO2: 23 mmol/L (ref 22–32)
Calcium: 8.5 mg/dL — ABNORMAL LOW (ref 8.9–10.3)
Chloride: 104 mmol/L (ref 98–111)
Creatinine, Ser: 0.98 mg/dL (ref 0.61–1.24)
GFR, Estimated: >60 mL/min (ref >60)
Glucose, Bld: 117 mg/dL — ABNORMAL HIGH (ref 70–99)
Potassium: 3.5 mmol/L (ref 3.5–5.1)
Sodium: 138 mmol/L (ref 135–145)
Total Bilirubin: 0.9 mg/dL (ref 0.3–1.2)
Total Protein: 7.3 g/dL (ref 6.5–8.1)

## 2022-02-25 LAB — PROTIME-INR
INR: 1 (ref 0.8–1.2)
Prothrombin Time: 12.9 seconds (ref 11.4–15.2)

## 2022-02-25 LAB — APTT: aPTT: 28 seconds (ref 24–36)

## 2022-02-25 LAB — CBC
HCT: 45.2 % (ref 39.0–52.0)
Hemoglobin: 16 g/dL (ref 13.0–17.0)
MCH: 29.8 pg (ref 26.0–34.0)
MCHC: 35.4 g/dL (ref 30.0–36.0)
MCV: 84.2 fL (ref 80.0–100.0)
Platelets: 160 10*3/uL (ref 150–400)
RBC: 5.37 MIL/uL (ref 4.22–5.81)
RDW: 13.2 % (ref 11.5–15.5)
WBC: 5.2 10*3/uL (ref 4.0–10.5)
nRBC: 0 % (ref 0.0–0.2)

## 2022-02-25 LAB — I-STAT CHEM 8, ED
BUN: 17 mg/dL (ref 6–20)
Calcium, Ion: 1.09 mmol/L — ABNORMAL LOW (ref 1.15–1.40)
Chloride: 105 mmol/L (ref 98–111)
Creatinine, Ser: 1 mg/dL (ref 0.61–1.24)
Glucose, Bld: 116 mg/dL — ABNORMAL HIGH (ref 70–99)
HCT: 46 % (ref 39.0–52.0)
Hemoglobin: 15.6 g/dL (ref 13.0–17.0)
Potassium: 3.6 mmol/L (ref 3.5–5.1)
Sodium: 140 mmol/L (ref 135–145)
TCO2: 25 mmol/L (ref 22–32)

## 2022-02-25 LAB — ETHANOL: Alcohol, Ethyl (B): <10 mg/dL (ref <10)

## 2022-02-25 LAB — CBG MONITORING, ED: Glucose-Capillary: 120 mg/dL — ABNORMAL HIGH (ref 70–99)

## 2022-02-25 MED ORDER — TENECTEPLASE FOR STROKE
PACK | INTRAVENOUS | Status: AC
  Administered 2022-02-25: 25 mg via INTRAVENOUS
  Filled 2022-02-25: qty 10

## 2022-02-25 MED ORDER — TENECTEPLASE FOR STROKE: 25.0000 mg | PACK | Freq: Once | INTRAVENOUS | Status: AC

## 2022-02-25 MED ORDER — IOHEXOL 350 MG/ML SOLN
100.0000 mL | Freq: Once | INTRAVENOUS | Status: AC | PRN
  Administered 2022-02-25: 100 mL via INTRAVENOUS

## 2022-02-25 NOTE — ED Notes (Signed)
CODE STROKE PAGED @ 2130 upon EMS endcode

## 2022-02-25 NOTE — ED Triage Notes (Signed)
Pt had witnessed syncopal episode and woke unable to talk with weakness. LKW 2115. Blood sugar was 125

## 2022-02-25 NOTE — Consult Note (Addendum)
[facility] Tele[facility]urology Consult Services   Patient Name:   Alexander Carroll, Alexander Carroll Date of Birth:   Aug 27, 1965 Identification Number:   MRN - [id number] Date of Service:   02/25/2022 21:51:47  Diagnosis:       I63.9 - Cerebrovascular accident (CVA), unspecified mechanism ([place])  Impression:      Patient with concern for stroke s/p thrombolytic    L HB numbness, dysarhtira, aphasia vs confusion    CT Head no overt hypodensity possible R temporal lobe loss grey white matter differentiation confirmed LKN with his wife, extensive risks vs benefits discussion, given felt symptoms disabling agreeable to proceed. Understanding risks of bleeding and other differential. No notable contraindications upon review.      pending stat CTA/CTP    Recommendations:   Patient with concern for acute stroke without contraindications for thrombolytic. Patient needs ICU admission for post-thrombolytic management as follows:   - post tPA protocol with serial BP monitoring to ensure SBP <180 and DBP <105   - q1 neuro checks for 24 hours then q4   - telemetry   - Recommend fall precautions and seizure precautions   - Will need bedside swallow eval prior to PO  - Start or continue high intensity statin (ex. Atorvastatin 80 mg)   PT/OT/ST eval   Imaging: MRI brain, 2D Echo w bubble   Labs: COVID, UDS, ETOH, ESR/CRP, TROP, CK, TSH, B12, BNP, LACTIC ACID, LIPID PANEL, and A1C.   Encourage smoking cessation if applicable with nicotine patches.   Goal LDL <70 and HgA1c<7%   r CT Head 24 hours post tPA if without bleed can start Aspirin 81 mg and DVT chemical PPX 24 hours post-tPA   CT Head STAT if acute severe headache and/or neurologic decline       Contraindications to thrombolytic reviewed. I have explained to the patient/family/guardian the nature of the patient's condition, the use of tPA fibrinolytic agent, and the benefits to be reasonably expected compared with alternative approaches. I  have discussed the likelihood of major risks or complications of this procedure including (if applicable) but not limited to loss of limb function, brain damage, paralysis, hemorrhage, infection, complications from transfusion of blood components, drug reactions, blood clots, and loss of life. I have also indicated that with any procedure there is always the possibility of an unexpected complication. I have explained the risks, which include. All questions were answered and the patient/family/guardian expressed understanding of the treatment plan and consented to the procedure.    Our recommendations are outlined below. Recommendations: IV Tenecteplase recommended.  I confirmed the following. (Patient name, DOB, MRN, Blood Pressure, dose of Thrombolytic and waste, weight completed by stretcher/scale not stated weight, have ED staff inform ED MD of thrombolytic decision)  IV Tenecteplase Total Dose - 25.0 mg   Routine post Thrombolytic monitoring including neuro checks and blood pressure control during/after treatment Monitor blood pressure Check blood pressure and neuro assessment every 15 min for 2 h, then every 30 min for 6 h, and finally every hour for 16 h.  Manage Blood Pressure per post Thrombolytic protocol.        Follow designated hospital protocol for admission and post thrombolytic care       CT brain 24 hours post Thrombolytic       NPO until swallowing screen performed and passed       No antiplatelet agents or anticoagulants (including heparin for DVT prophylaxis) in first 24 hours       No Foley  catheter, nasogastric tube, arterial catheter or central venous catheter for 24 hr, unless absolutely necessary       Telemetry       Bedside swallow evaluation       HOB less than 30 degrees       Euglycemia       Avoid hyperthermia, PRN acetaminophen       DVT prophylaxis       Inpatient Neurology Consultation       Stroke evaluation as per inpatient neurology  recommendations  Discussed with ED physician    ------------------------------------------------------------------------------  Advanced Imaging: Advanced imaging has been ordered. Results pending.   Metrics: Last Known Well: 02/25/2022 21:00:00 [facility] Notification Time: 02/25/2022 21:51:47 Arrival Time: 02/25/2022 21:34:00 Stamp Time: 02/25/2022 21:51:47 Initial Response Time: 02/25/2022 21:55:05 Symptoms: difficulty speaking, numbness and weakness . Initial patient interaction: 02/25/2022 22:01:01 NIHSS Assessment Completed: 02/25/2022 22:01:02 Patient is a candidate for Thrombolytic. Thrombolytic Medical Decision: 02/25/2022 22:16:29 Needle Time: 02/25/2022 22:21:13 Weight Noted by Staff: 114.4 kg  I personally Reviewed the CT Head and it Showed NAF.  radiology: IMPRESSION: 1. Hypodensity in the right temporal lobe with apparent loss of gray-white differentiation, which may be artifactual but is asymmetric and indeterminate. Recommend MRI for further evaluation. 2. No acute intracranial hemorrhage. 3. ASPECTS is 9.  Primary Provider Notified of Diagnostic Impression and Management Plan on: 02/25/2022 22:28:57    ------------------------------------------------------------------------------  Additional Thrombolytic Comments:     Awaiting on history by family on potential contraindications     More extensive discussion on the management decision was requested by patient or family  Thrombolytic Contraindications:  Last Known Well > 4.5 hours: No CT Head showing hemorrhage: No Ischemic stroke within 3 months: No Severe head trauma within 3 months: No Intracranial/intraspinal surgery within 3 months: No History of intracranial hemorrhage: No Symptoms and signs consistent with an SAH: No GI malignancy or GI bleed within 21 days: No Coagulopathy: Platelets <100 000 /mm3, INR >1.7, aPTT>40 s, or PT >15 s: No Treatment dose of LMWH within the previous 24  hrs: No Use of NOACs in past 48 hours: No Glycoprotein IIb/IIIa receptor inhibitors use: No Symptoms consistent with infective endocarditis: No Suspected aortic arch dissection: No Intra-axial intracranial neoplasm: No  Thrombolytic Decision and Management Plan: Management with thrombolytic treatment was explained to the Patient and Family as was risks and benefits and alternatives to the treatment. Patient agrees with the decision to proceed with thrombolytic treatment. . All questions were answered and the Patient and Family expressed understanding of the treatment plan.   History of Present Illness: Patient is a 57 year old Male.  Patient was brought by EMS for symptoms of difficulty speaking, numbness and weakness . Patient was with his wife listening to the a sports game and acting and speaking normally noted that he was having a mild cold but otherwise nothing out of the ordinary. He got up to go get ready for work and suddenly collapsed his wife noted that he was not speaking and appeared generally weak.  Currently the patient appears confused, has marked dysarthria and hoarseness, left face arm and leg numbness and some concern for aphasia.       Past Medical History:      There is no history of Hypertension      There is no history of Stroke [place] PMH:  GERD, Hernia  Medications:  No Anticoagulant use  No Antiplatelet use Reviewed EMR for current medications  Allergies:  Reviewed  Social History:  Smoking: No  Family History:  There is no family history of premature cerebrovascular disease pertinent to this consultation  ROS : 14 Points Review of Systems was performed and was negative except mentioned in HPI.  Past Surgical History: There Is No Surgical History Contributory To Today's Visit    Examination: BP(159/97), Pulse(67), Blood Glucose(120) 1A: Level of Consciousness - Alert; keenly responsive + 0 1B: Ask Month and Age - 1 Question Right + 1 1C:  Blink Eyes & Squeeze Hands - Performs Both Tasks + 0 2: Test Horizontal Extraocular Movements - Normal + 0 3: Test Visual Fields - No Visual Loss + 0 4: Test Facial Palsy (Use Grimace if Obtunded) - Normal symmetry + 0 5A: Test Left Arm Motor Drift - No Drift for 10 Seconds + 0 5B: Test Right Arm Motor Drift - No Drift for 10 Seconds + 0 6A: Test Left Leg Motor Drift - Drift, but doesn't hit bed + 1 6B: Test Right Leg Motor Drift - Drift, but doesn't hit bed + 1 7: Test Limb Ataxia (FNF/Heel-Shin) - Ataxia in 1 Limb + 1 8: Test Sensation - Mild-Moderate Loss: Can Sense Being Touched + 1 9: Test Language/Aphasia - Mild-Moderate Aphasia: Some Obvious Changes, Without Significant Limitation + 1 10: Test Dysarthria - Mild-Moderate Dysarthria: Slurring but can be understood + 1 11: Test Extinction/Inattention - No abnormality + 0  NIHSS Score: 7 NIHSS Free Text : only 1/5 on naming, decreased sensation on L face, arm and leg, hoarseness dysarthira  Pre-Morbid Modified Rankin Scale: 0 Points = No symptoms at all  Spoke with : [place]  Patient/Family was informed the Neurology Consult would occur via TeleHealth consult by way of interactive audio and video telecommunications and consented to receiving care in this manner.   Patient is being evaluated for possible acute neurologic impairment and high probability of imminent or life-threatening deterioration. I spent total of 45 minutes providing care to this patient, including time for face to face visit via telemedicine, review of medical records, imaging studies and discussion of findings with providers, the patient and/or family.   Dr Donzetta Kohut   [facility] For Inpatient follow-up with [facility] physician please call [facility] [phone]. This is not an outpatient service. Post hospital discharge, please contact hospital directly.  Please do not communicate with [facility] physicians via secure chat. If you have any  questions, Please contact [facility]. Please call or reconsult our service if there are any clinical or diagnostic changes.

## 2022-02-25 NOTE — Progress Notes (Addendum)
CODE STROKE  Elert @ 2140 Patient already to and from CT upon elert LKWT @ 2100, verified by brother-in-law No blood thinners mRs 0 VAN positive for TSRN with aphasia Paged [facility]SMD @ 2151 TSRN attempted to call wife with numbers provided in chart, no answer and no voicemail availability Brother-in-law to bedside @ 2156 Dr. Ranae Pila on screen @ 2158 Brother-in-law able to get wife on phone @ 2210 Dr. Ranae Pila discussing case with wife via speaker phone over cart Wife Langley Gauss consented to TNK @ 2216  TNK timeline Blood pressure @2220$  is 159/97 HR @2220$  is  68 bpm Timeout @ 2220 right patient, dose, route, time, consent  In subsequent order.. 1st Flush @ 2221 Bolus 68m/5 ml @ 2221 2nd flush @ 2221  Dr. IRanae Pilaoff screen @ 2223 Order for CTA/CTP @ 2226 To CT @ 2236 Back from CT @ 2248  TSRN off cart @ 2300 Monitoring for CTP read to relay to TSMD.   Neuro Check and VS timeline Q 15 2230 2245 2300 2315 2330 2345 0000 0015  Q 30 0045 0115 0145 0215 0245 0315 0345 0415 0445 $ 0515 0545 0615  Q 1 hour 0715 0815 0915 1015 1115 1215 1315 1415 1515 1615 $ 1715 1815 1915 2015 2215

## 2022-02-25 NOTE — ED Provider Notes (Signed)
[facility] Provider Note   CSN: [id number] Arrival date & time: 02/25/22  2134  An emergency department physician performed an initial assessment on this suspected stroke patient at 2136.  History  Chief Complaint  Patient presents with   Code Stroke    Jarquis Reichl is a 57 y.o. male.  Pt is a 57 yo male with pmhx significant for gerd.  Pt was normal at 2115.  His wife noted a syncopal event and he woke unable to speak or move his left side.  BS 125.  Pt unable to give much hx.  Pt brought in by EMS       Home Medications Prior to Admission medications   Medication Sig Start Date End Date Taking? Authorizing Provider  acetaminophen (TYLENOL) 500 MG tablet Take 500-1,000 mg by mouth every 6 (six) hours as needed for mild pain or moderate pain (for headaches.).     [provider]  loratadine-pseudoephedrine (CLARITIN-D 12 HOUR) 5-120 MG tablet Take 1 tablet by mouth 2 (two) times daily. Patient not taking: Reported on 01/26/2018 02/17/17   Lily Kocher, PA-C  ondansetron (ZOFRAN) 4 MG tablet Take 1 tablet (4 mg total) by mouth 3 (three) times daily with meals as needed for nausea or vomiting. 06/02/17   Carlis Stable, NP  pantoprazole (PROTONIX) 40 MG tablet Take 1 tablet (40 mg total) by mouth 2 (two) times daily before a meal. 04/01/17   Carlis Stable, NP  promethazine (PHENERGAN) 12.5 MG tablet 1 po 30 MINS PRIOR TO MEALS OR EVERY 4 HRS TO PREVENT nausea or vomiting Patient not taking: Reported on 11/02/2017 08/25/17   Danie Binder, MD  sucralfate (CARAFATE) 1 GM/10ML suspension Take 10 mLs (1 g total) by mouth 4 (four) times daily -  with meals and at bedtime. Patient not taking: Reported on 08/25/2017 06/20/17   Kinnie Feil, PA-C      Allergies    Penicillins    Review of Systems   Review of Systems  Neurological:  Positive for speech difficulty and weakness.  All other systems reviewed and are  negative.   Physical Exam Updated Vital Signs BP (!) 158/94   Pulse 66   Temp 97.9 F (36.6 C) (Oral)   Resp (!) 22   Ht 6' 2"$  (1.88 m)   Wt 114.4 kg   SpO2 96%   BMI 32.37 kg/m  Physical Exam Vitals and nursing note reviewed.  Constitutional:      Appearance: Normal appearance.  HENT:     Head: Normocephalic and atraumatic.     Right Ear: External ear normal.     Left Ear: External ear normal.     Nose: Nose normal.     Mouth/Throat:     Mouth: Mucous membranes are dry.  Eyes:     Extraocular Movements: Extraocular movements intact.     Conjunctiva/sclera: Conjunctivae normal.     Pupils: Pupils are equal, round, and reactive to light.  Cardiovascular:     Rate and Rhythm: Normal rate and regular rhythm.     Pulses: Normal pulses.     Heart sounds: Normal heart sounds.  Pulmonary:     Effort: Pulmonary effort is normal.     Breath sounds: Normal breath sounds.  Abdominal:     General: Abdomen is flat. Bowel sounds are normal.     Palpations: Abdomen is soft.  Musculoskeletal:        General: Normal range of  motion.     Cervical back: Normal range of motion and neck supple.  Skin:    General: Skin is warm.     Capillary Refill: Capillary refill takes less than 2 seconds.  Neurological:     Mental Status: He is alert.     Comments: Pt is weak on the left; he has an aphasia; he is slow to respond to commands; unable to assess if he can see ok, but he's looking at me  Psychiatric:        Mood and Affect: Mood normal.        Behavior: Behavior normal.     ED Results / Procedures / Treatments   Labs (all labs ordered are listed, but only abnormal results are displayed) Labs Reviewed  COMPREHENSIVE METABOLIC PANEL - Abnormal; Notable for the following components:      Result Value   Glucose, Bld 117 (*)    Calcium 8.5 (*)    All other components within normal limits  I-STAT CHEM 8, ED - Abnormal; Notable for the following components:   Glucose, Bld 116 (*)     Calcium, Ion 1.09 (*)    All other components within normal limits  CBG MONITORING, ED - Abnormal; Notable for the following components:   Glucose-Capillary 120 (*)    All other components within normal limits  ETHANOL  PROTIME-INR  APTT  CBC  DIFFERENTIAL  RAPID URINE DRUG SCREEN, HOSP PERFORMED  URINALYSIS, ROUTINE W REFLEX MICROSCOPIC    EKG EKG Interpretation  Date/Time:  Thursday February 25 2022 21:48:32 EST Ventricular Rate:  65 PR Interval:  125 QRS Duration: 96 QT Interval:  446 QTC Calculation: 464 R Axis:   -9 Text Interpretation: Sinus rhythm Abnormal R-wave progression, early transition LVH with secondary repolarization abnormality No significant change since last tracing Confirmed by Isla Pence [phone]) on 02/25/2022 10:36:04 PM  Radiology CT HEAD CODE STROKE WO CONTRAST  Result Date: 02/25/2022 CLINICAL DATA:  Code stroke. Witnessed syncopal episode, unable to talk and weakness upon waking EXAM: CT HEAD WITHOUT CONTRAST TECHNIQUE: Contiguous axial images were obtained from the base of the skull through the vertex without intravenous contrast. RADIATION DOSE REDUCTION: This exam was performed according to the departmental dose-optimization program which includes automated exposure control, adjustment of the mA and/or kV according to patient size and/or use of iterative reconstruction technique. COMPARISON:  None Available. FINDINGS: Brain: Hypodensity in the right temporal lobe with apparent loss of gray-white differentiation (series 2, image 12); this may be artifactual but is asymmetric and indeterminate. No evidence of acute hemorrhage, mass, mass effect, or midline shift. No hydrocephalus or extra-axial collection. Vascular: No hyperdense vessel. Skull: Negative for fracture or focal lesion. Sinuses/Orbits: No acute finding. Other: The mastoid air cells are well aerated. ASPECTS [facility] Stroke Program Early CT Score) - Ganglionic level infarction (caudate,  lentiform nuclei, internal capsule, insula, M1-M3 cortex): 6 - Supraganglionic infarction (M4-M6 cortex): 3 Total score (0-10 with 10 being normal): 9 IMPRESSION: 1. Hypodensity in the right temporal lobe with apparent loss of gray-white differentiation, which may be artifactual but is asymmetric and indeterminate. Recommend MRI for further evaluation. 2. No acute intracranial hemorrhage. 3. ASPECTS is 9. Code stroke imaging results were communicated on 02/25/2022 at 9:54 pm to provider [facility] via telephone, who verbally acknowledged these results. Electronically Signed   By: Merilyn Baba M.D.   On: 02/25/2022 21:55    Procedures Procedures    Medications Ordered in ED Medications  tenecteplase (TNKASE) injection for  Stroke 25 mg (25 mg Intravenous Given 02/25/22 2221)  iohexol (OMNIPAQUE) 350 MG/ML injection 100 mL (100 mLs Intravenous Contrast Given 02/25/22 2254)    ED Course/ Medical Decision Making/ A&P                             Medical Decision Making Amount and/or Complexity of Data Reviewed Labs: ordered. Radiology: ordered.  Risk Decision regarding hospitalization.   This patient presents to the ED for concern of cva, this involves an extensive number of treatment options, and is a complaint that carries with it a high risk of complications and morbidity.  The differential diagnosis includes ischemic stroke, hemorrhagic stroke, tia, electrolyte abn, infection   Co morbidities that complicate the patient evaluation  gerd   Additional history obtained:  Additional history obtained from epic chart review External records from outside source obtained and reviewed including EMS, wife   Lab Tests:  I Ordered, and personally interpreted labs.  The pertinent results include:  cbc nl, cmp nl, etoh neg   Imaging Studies ordered:  I ordered imaging studies including ct head, cta head/neck, cxr  I independently visualized and interpreted imaging which showed  CT  head: Hypodensity in the right temporal lobe with apparent loss of  gray-white differentiation, which may be artifactual but is  asymmetric and indeterminate. Recommend MRI for further evaluation.  2. No acute intracranial hemorrhage.  3. ASPECTS is 9.  CTA pending at shift change I agree with the radiologist interpretation   Cardiac Monitoring:  The patient was maintained on a cardiac monitor.  I personally viewed and interpreted the cardiac monitored which showed an underlying rhythm of: nsr   Medicines ordered and prescription drug management:  I have reviewed the patients home medicines and have made adjustments as needed   Test Considered:  Ct/cta   Critical Interventions:  Code stroke   Consultations Obtained:  I requested consultation with the teleneurologist ,  and discussed lab and imaging findings as well as pertinent plan - they recommend: TNK Pt d/w Dr. Lorrin Goodell (neuro on at [facility]).  He will accept pt for transfer.   Problem List / ED Course:  CVA:  TNK given   Reevaluation:  After the interventions noted above, I reevaluated the patient and found that they have :stayed the same   Social Determinants of Health:  Lives at home   Dispostion:  After consideration of the diagnostic results and the patients response to treatment, I feel that the patent would benefit from admission.    CRITICAL CARE Performed by: Isla Pence   Total critical care time: 30 minutes  Critical care time was exclusive of separately billable procedures and treating other patients.  Critical care was necessary to treat or prevent imminent or life-threatening deterioration.  Critical care was time spent personally by me on the following activities: development of treatment plan with patient and/or surrogate as well as nursing, discussions with consultants, evaluation of patient's response to treatment, examination of patient, obtaining history from patient or  surrogate, ordering and performing treatments and interventions, ordering and review of laboratory studies, ordering and review of radiographic studies, pulse oximetry and re-evaluation of patient's condition.         Final Clinical Impression(s) / ED Diagnoses Final diagnoses:  Cerebrovascular accident (CVA), unspecified mechanism ([place])    Rx / DC Orders ED Discharge Orders     None         Kyliee Ortego,  Almyra Free, MD 03/02/22 [phone]

## 2022-02-26 ENCOUNTER — Inpatient Hospital Stay (HOSPITAL_COMMUNITY): Payer: 59

## 2022-02-26 DIAGNOSIS — I6389 Other cerebral infarction: Secondary | ICD-10-CM

## 2022-02-26 DIAGNOSIS — F444 Conversion disorder with motor symptom or deficit: Secondary | ICD-10-CM

## 2022-02-26 DIAGNOSIS — Z789 Other specified health status: Secondary | ICD-10-CM | POA: Diagnosis not present

## 2022-02-26 DIAGNOSIS — I6300 Cerebral infarction due to thrombosis of unspecified precerebral artery: Secondary | ICD-10-CM

## 2022-02-26 DIAGNOSIS — I639 Cerebral infarction, unspecified: Secondary | ICD-10-CM | POA: Diagnosis present

## 2022-02-26 LAB — LIPID PANEL
Cholesterol: 131 mg/dL (ref 0–200)
HDL: 32 mg/dL — ABNORMAL LOW (ref >40)
LDL Cholesterol: 84 mg/dL (ref 0–99)
Total CHOL/HDL Ratio: 4.1 RATIO
Triglycerides: 73 mg/dL (ref <150)
VLDL: 15 mg/dL (ref 0–40)

## 2022-02-26 LAB — HEMOGLOBIN A1C
Hgb A1c MFr Bld: 4.8 % (ref 4.8–5.6)
Mean Plasma Glucose: 91.06 mg/dL

## 2022-02-26 LAB — ECHOCARDIOGRAM COMPLETE
Area-P 1/2: 4.06 cm2
Height: 74 in
MV M vel: 0.75 m/s
MV Peak grad: 2.2 mmHg
S' Lateral: 2.6 cm
Weight: 4033.6 oz

## 2022-02-26 LAB — HIV ANTIBODY (ROUTINE TESTING W REFLEX): HIV Screen 4th Generation wRfx: NONREACTIVE

## 2022-02-26 LAB — MRSA NEXT GEN BY PCR, NASAL: MRSA by PCR Next Gen: NOT DETECTED

## 2022-02-26 MED ORDER — ACETAMINOPHEN 650 MG RE SUPP: 650.0000 mg | RECTAL | Status: DC | PRN

## 2022-02-26 MED ORDER — PANTOPRAZOLE SODIUM 40 MG PO TBEC
40.0000 mg | DELAYED_RELEASE_TABLET | Freq: Every day | ORAL | Status: DC
  Administered 2022-02-26: 40 mg via ORAL
  Filled 2022-02-26: qty 1

## 2022-02-26 MED ORDER — LIDOCAINE HCL URETHRAL/MUCOSAL 2 % EX GEL
1.0000 | Freq: Once | CUTANEOUS | Status: AC
  Administered 2022-02-26: 1 via URETHRAL
  Filled 2022-02-26: qty 11

## 2022-02-26 MED ORDER — STROKE: EARLY STAGES OF RECOVERY BOOK
Freq: Once | Status: AC
  Filled 2022-02-26: qty 1

## 2022-02-26 MED ORDER — CHLORHEXIDINE GLUCONATE CLOTH 2 % EX PADS
6.0000 | MEDICATED_PAD | Freq: Every day | CUTANEOUS | Status: DC
  Administered 2022-02-26 – 2022-02-27 (×2): 6 via TOPICAL

## 2022-02-26 MED ORDER — ORAL CARE MOUTH RINSE: 15.0000 mL | OROMUCOSAL | Status: DC | PRN

## 2022-02-26 MED ORDER — SODIUM CHLORIDE 0.9 % IV SOLN: INTRAVENOUS | Status: AC

## 2022-02-26 MED ORDER — SENNOSIDES-DOCUSATE SODIUM 8.6-50 MG PO TABS
1.0000 | ORAL_TABLET | Freq: Every evening | ORAL | Status: DC | PRN
  Administered 2022-02-26: 1 via ORAL
  Filled 2022-02-26: qty 1

## 2022-02-26 MED ORDER — PANTOPRAZOLE SODIUM 40 MG IV SOLR
40.0000 mg | Freq: Every day | INTRAVENOUS | Status: DC
  Administered 2022-02-26: 40 mg via INTRAVENOUS
  Filled 2022-02-26: qty 10

## 2022-02-26 MED ORDER — ACETAMINOPHEN 325 MG PO TABS
650.0000 mg | ORAL_TABLET | ORAL | Status: DC | PRN
  Administered 2022-02-26: 650 mg via ORAL
  Filled 2022-02-26: qty 2

## 2022-02-26 MED ORDER — ONDANSETRON HCL 4 MG/2ML IJ SOLN: 4.0000 mg | Freq: Four times a day (QID) | INTRAMUSCULAR | Status: DC | PRN

## 2022-02-26 MED ORDER — ROSUVASTATIN CALCIUM 20 MG PO TABS
20.0000 mg | ORAL_TABLET | Freq: Every day | ORAL | Status: DC
  Administered 2022-02-26 – 2022-02-27 (×2): 20 mg via ORAL
  Filled 2022-02-26 (×2): qty 1

## 2022-02-26 MED ORDER — ACETAMINOPHEN 160 MG/5ML PO SOLN: 650.0000 mg | ORAL | Status: DC | PRN

## 2022-02-26 NOTE — Progress Notes (Addendum)
STROKE TEAM PROGRESS NOTE   INTERVAL HISTORY Patient is seen in his room with no family at the bedside.  Yesterday, he awoke at 2100 to get ready for his job on the third shift, fell over and passed out.  When he awoke, he was trembling with fluttering of his eyes.  This went on for about 20 minutes, and EMS was called.  Patient presented to the ED with confusion, stuttering, dysarthric speech and left-sided numbness.  He was given TNK for possible stroke symptoms.  MRI pending. Blood pressure adequately controlled.  Neurological exam is improving.  Vital signs are stable. Vitals:   02/26/22 1100 02/26/22 1200 02/26/22 1300 02/26/22 1400  BP: 127/89 121/79 113/84 115/82  Pulse: 72 83 76 69  Resp: (!) 23 16 20 10  $ Temp:      TempSrc:      SpO2: 93% 94%  92%  Weight:      Height:       CBC:  Recent Labs  Lab 02/25/22 2159 02/25/22 2203  WBC 5.2  --   NEUTROABS 3.2  --   HGB 16.0 15.6  HCT 45.2 46.0  MCV 84.2  --   PLT 160  --    Basic Metabolic Panel:  Recent Labs  Lab 02/25/22 2159 02/25/22 2203  NA 138 140  K 3.5 3.6  CL 104 105  CO2 23  --   GLUCOSE 117* 116*  BUN 17 17  CREATININE 0.98 1.00  CALCIUM 8.5*  --    Lipid Panel:  Recent Labs  Lab 02/26/22 0440  CHOL 131  TRIG 73  HDL 32*  CHOLHDL 4.1  VLDL 15  LDLCALC 84   HgbA1c:  Recent Labs  Lab 02/26/22 0440  HGBA1C 4.8   Urine Drug Screen: No results for input(s): "LABOPIA", "COCAINSCRNUR", "LABBENZ", "AMPHETMU", "THCU", "LABBARB" in the last 168 hours.  Alcohol Level  Recent Labs  Lab 02/25/22 2159  ETH <10    IMAGING past 24 hours ECHOCARDIOGRAM COMPLETE  Result Date: 02/26/2022    ECHOCARDIOGRAM REPORT   Patient Name:   Alexander Carroll Date of Exam: 02/26/2022 Medical Rec #:  [id number]     Height:       74.0 in Accession #:    [id number]    Weight:       252.1 lb Date of Birth:  25-Aug-1965     BSA:          2.398 m Patient Age:    57 years      BP:           121/86 mmHg Patient Gender: M              HR:           69 bpm. Exam Location:  Inpatient Procedure: 2D Echo, Cardiac Doppler and Color Doppler Indications:    Stroke I63.9  History:        Patient has no prior history of Echocardiogram examinations.                 Stroke; Risk Factors:Non-Smoker.  Sonographer:    Greer Pickerel Referring Phys: [id number] [facility]  Sonographer Comments: No subcostal window. Image acquisition challenging due to patient body habitus and Image acquisition challenging due to respiratory motion. IMPRESSIONS  1. Left ventricular ejection fraction, by estimation, is 60 to 65%. The left ventricle has normal function. The left ventricle has no regional wall motion abnormalities. Left ventricular diastolic parameters are consistent with Grade I  diastolic dysfunction (impaired relaxation).  2. Right ventricular systolic function is normal. The right ventricular size is moderately enlarged.  3. Right atrial size was moderately dilated.  4. The mitral valve is normal in structure. No evidence of mitral valve regurgitation. No evidence of mitral stenosis.  5. The aortic valve is tricuspid. Aortic valve regurgitation is not visualized. No aortic stenosis is present.  6. The inferior vena cava is normal in size with greater than 50% respiratory variability, suggesting right atrial pressure of 3 mmHg. Conclusion(s)/Recommendation(s): No intracardiac source of embolism detected on this transthoracic study. Consider a transesophageal echocardiogram to exclude cardiac source of embolism if clinically indicated. FINDINGS  Left Ventricle: Left ventricular ejection fraction, by estimation, is 60 to 65%. The left ventricle has normal function. The left ventricle has no regional wall motion abnormalities. The left ventricular internal cavity size was normal in size. There is  no left ventricular hypertrophy. Left ventricular diastolic parameters are consistent with Grade I diastolic dysfunction (impaired relaxation). Right Ventricle: The  right ventricular size is moderately enlarged. No increase in right ventricular wall thickness. Right ventricular systolic function is normal. Left Atrium: Left atrial size was normal in size. Right Atrium: Right atrial size was moderately dilated. Pericardium: There is no evidence of pericardial effusion. Thickening/calcification of pericardium present. Mitral Valve: The mitral valve is normal in structure. No evidence of mitral valve regurgitation. No evidence of mitral valve stenosis. Tricuspid Valve: The tricuspid valve is normal in structure. Tricuspid valve regurgitation is not demonstrated. No evidence of tricuspid stenosis. Aortic Valve: The aortic valve is tricuspid. Aortic valve regurgitation is not visualized. No aortic stenosis is present. Pulmonic Valve: The pulmonic valve was normal in structure. Pulmonic valve regurgitation is not visualized. No evidence of pulmonic stenosis. Aorta: The aortic root is normal in size and structure. Venous: The inferior vena cava is normal in size with greater than 50% respiratory variability, suggesting right atrial pressure of 3 mmHg. IAS/Shunts: No atrial level shunt detected by color flow Doppler.  LEFT VENTRICLE PLAX 2D LVIDd:         4.10 cm   Diastology LVIDs:         2.60 cm   LV e' medial:    6.42 cm/s LV PW:         1.40 cm   LV E/e' medial:  6.6 LV IVS:        1.00 cm   LV e' lateral:   6.74 cm/s LVOT diam:     2.30 cm   LV E/e' lateral: 6.3 LV SV:         71 LV SV Index:   29 LVOT Area:     4.15 cm  RIGHT VENTRICLE RV S prime:     15.30 cm/s TAPSE (M-mode): 2.2 cm LEFT ATRIUM           Index        RIGHT ATRIUM           Index LA diam:      3.60 cm 1.50 cm/m   RA Area:     35.60 cm LA Vol (A2C): 44.8 ml 18.68 ml/m  RA Volume:   141.00 ml 58.79 ml/m LA Vol (A4C): 63.9 ml 26.65 ml/m  AORTIC VALVE LVOT Vmax:   73.60 cm/s LVOT Vmean:  47.800 cm/s LVOT VTI:    0.170 m  AORTA Ao Root diam: 4.00 cm Ao Asc diam:  3.00 cm MITRAL VALVE  TRICUSPID  VALVE MV Area (PHT): 4.06 cm    TR Peak grad:   4.0 mmHg MV Decel Time: 187 msec    TR Vmax:        99.60 cm/s MR Peak grad: 2.2 mmHg MR Vmax:      74.90 cm/s   SHUNTS MV E velocity: 42.20 cm/s  Systemic VTI:  0.17 m MV A velocity: 54.80 cm/s  Systemic Diam: 2.30 cm MV E/A ratio:  0.77 Candee Furbish MD Electronically signed by Candee Furbish MD Signature Date/Time: 02/26/2022/12:43:27 PM    Final    CT ANGIO HEAD NECK W WO CM W PERF (CODE STROKE)  Result Date: 02/26/2022 CLINICAL DATA:  Syncopal episode, woke unable to talk and weakness EXAM: CT ANGIOGRAPHY HEAD AND NECK CT PERFUSION BRAIN TECHNIQUE: Multidetector CT imaging of the head and neck was performed using the standard protocol during bolus administration of intravenous contrast. Multiplanar CT image reconstructions and MIPs were obtained to evaluate the vascular anatomy. Carotid stenosis measurements (when applicable) are obtained utilizing NASCET criteria, using the distal internal carotid diameter as the denominator. Multiphase CT imaging of the brain was performed following IV bolus contrast injection. Subsequent parametric perfusion maps were calculated using RAPID software. RADIATION DOSE REDUCTION: This exam was performed according to the departmental dose-optimization program which includes automated exposure control, adjustment of the mA and/or kV according to patient size and/or use of iterative reconstruction technique. CONTRAST:  1164m OMNIPAQUE IOHEXOL 350 MG/ML SOLN COMPARISON:  CT head 02/25/2022, no prior CTA FINDINGS: CT HEAD FINDINGS For noncontrast findings, please see same day CT head. CTA NECK FINDINGS Aortic arch: Standard branching. Imaged portion shows no evidence of aneurysm or dissection. No significant stenosis of the major arch vessel origins. Right carotid system: No evidence of stenosis, dissection, or occlusion. Left carotid system: No evidence of stenosis, dissection, or occlusion. Vertebral arteries: No evidence of stenosis,  dissection, or occlusion. Skeleton: No acute osseous abnormality.  Edentulous. Other neck: Negative. Upper chest: No focal pulmonary opacity or pleural effusion. Review of the MIP images confirms the above findings CTA HEAD FINDINGS Anterior circulation: Both internal carotid arteries are patent to the termini, without significant stenosis. A1 segments patent. Normal anterior communicating artery. Anterior cerebral arteries are patent to their distal aspects. No M1 stenosis or occlusion. MCA branches perfused and symmetric. Posterior circulation: Vertebral arteries patent to the vertebrobasilar junction without stenosis. Posterior inferior cerebellar arteries patent proximally. Basilar patent to its distal aspect. Superior cerebellar arteries patent proximally. Patent P1 segments. PCAs perfused to their distal aspects without stenosis. The bilateral posterior communicating arteries are diminutive but patent. Venous sinuses: As permitted by contrast timing, patent. Anatomic variants: None significant. Review of the MIP images confirms the above findings CT Brain Perfusion Findings: ASPECTS: 9 CBF (<30%) Volume: 013mPerfusion (Tmax>6.0s) volume: 64m37mismatch Volume: 64mL1mfarction Location:None IMPRESSION: 1. No intracranial large vessel occlusion or significant stenosis. 2. No hemodynamically significant stenosis in the neck. 3. Perfusion imaging demonstrates no core infarct or penumbra. Electronically Signed   By: AlisMerilyn Baba.   On: 02/26/2022 00:09   DG Chest Portable 1 View  Result Date: 02/25/2022 CLINICAL DATA:  Altered mental status, syncopal episode. EXAM: PORTABLE CHEST 1 VIEW COMPARISON:  08/23/2020. FINDINGS: Heart is enlarged and the mediastinal contour is stable. Lung volumes are low. Interstitial prominence is noted bilaterally. No effusion or pneumothorax. No acute osseous abnormality. IMPRESSION: 1. Cardiomegaly. 2. Interstitial prominence bilaterally, possible edema or infiltrate.  Electronically Signed   By: LaurMickel Baas  Lovena Le M.D.   On: 02/25/2022 22:56   CT HEAD CODE STROKE WO CONTRAST  Result Date: 02/25/2022 CLINICAL DATA:  Code stroke. Witnessed syncopal episode, unable to talk and weakness upon waking EXAM: CT HEAD WITHOUT CONTRAST TECHNIQUE: Contiguous axial images were obtained from the base of the skull through the vertex without intravenous contrast. RADIATION DOSE REDUCTION: This exam was performed according to the departmental dose-optimization program which includes automated exposure control, adjustment of the mA and/or kV according to patient size and/or use of iterative reconstruction technique. COMPARISON:  None Available. FINDINGS: Brain: Hypodensity in the right temporal lobe with apparent loss of gray-white differentiation (series 2, image 12); this may be artifactual but is asymmetric and indeterminate. No evidence of acute hemorrhage, mass, mass effect, or midline shift. No hydrocephalus or extra-axial collection. Vascular: No hyperdense vessel. Skull: Negative for fracture or focal lesion. Sinuses/Orbits: No acute finding. Other: The mastoid air cells are well aerated. ASPECTS [facility] Stroke Program Early CT Score) - Ganglionic level infarction (caudate, lentiform nuclei, internal capsule, insula, M1-M3 cortex): 6 - Supraganglionic infarction (M4-M6 cortex): 3 Total score (0-10 with 10 being normal): 9 IMPRESSION: 1. Hypodensity in the right temporal lobe with apparent loss of gray-white differentiation, which may be artifactual but is asymmetric and indeterminate. Recommend MRI for further evaluation. 2. No acute intracranial hemorrhage. 3. ASPECTS is 9. Code stroke imaging results were communicated on 02/25/2022 at 9:54 pm to provider [facility] via telephone, who verbally acknowledged these results. Electronically Signed   By: Merilyn Baba M.D.   On: 02/25/2022 21:55    PHYSICAL EXAM General: Alert, well-nourished, well-developed mildly obese middle-aged Caucasian  male, anxious appearing patient Respiratory: Regular, unlabored respirations on room air  NEURO:  Mental Status: AA&Ox3  Speech/Language: speech is stuttering without dysarthria  Cranial Nerves:  II: PERRL. Visual fields full.  III, IV, VI: EOMI. Eyelids elevate symmetrically.  V: Sensation is diminished on the left with forehead splitting to vibration VII: Smile is symmetrical.   VIII: hearing intact to voice. IX, X: Phonation is normal.  [id number] shrug 5/5. XII: tongue is midline without fasciculations. Motor: 5/5 strength to bilateral upper extremity and right lower extremity, 4+/5 to left lower extremity Tone: is normal and bulk is normal Sensation- Intact to light touch with some numbness on the left Coordination: FTN intact on right with ataxia in left hand, diminished fine finger movements on the left Gait- deferred   ASSESSMENT/PLAN [name] is a 57 y.o. male with history of GERD and hiatal hernia presenting after he fell over and passed out upon waking up.  When he awoke, he was trembling with fluttering of his eyes.  This went on for about 20 minutes, and EMS was called.  Patient presented to the ED with confusion, stuttering, dysarthric speech and left-sided numbness.  He was given TNK for possible stroke symptoms.  MRI pending.  Stroke like episode status post TNK versus functional neurological symptoms Code Stroke CT head hypodensity in right temporal lobe, which may be artifactual ASPECTS 9 CTA head & neck no LVO or hemodynamically significant stenosis CT perfusion no core or penumbra seen MRI pending 2D Echo EF 60 to [id number], grade 1 diastolic dysfunction, normal left atrial size, no atrial level shunt LDL 84 HgbA1c 4.8 VTE prophylaxis -SCDs    Diet   Diet regular Room service appropriate? Yes; Fluid consistency: Thin   No antithrombotic prior to admission, now on No antithrombotic as he is less than 24 hours from TNK administration Therapy  recommendations: Pending Disposition: Pending  Hypertension Home meds: None Stable Keep blood pressure less than 180/105 Long-term BP goal normotensive  Hyperlipidemia Home meds: None LDL 84, goal < 70 Add rosuvastatin 20 mg daily Continue statin at discharge  Other Stroke Risk Factors Obesity, Body mass index is 32.37 kg/m., BMI >/= 30 associated with increased stroke risk, recommend weight loss, diet and exercise as appropriate   Other Active Problems none  Hospital day # [place] , MSN, AGACNP-BC [facility] See Amion for schedule and pager information 02/26/2022 2:50 PM  STROKE MD NOTE :  I have personally obtained history,examined this patient, reviewed notes, independently viewed imaging studies, participated in medical decision making and plan of care.ROS completed by me personally and pertinent positives fully documented  I have made any additions or clarifications directly to the above note. Agree with note above.  Patient presented with sudden onset tremulousness, generalized weakness left-sided numbness and strokelike episode and was treated by teleneurologist with IV TNK with improvement in his symptoms.  Continue close neurological follow-up and strict blood pressure control as per course from the lactic protocol.  Mobilize out of bed.  Therapy consults.  Check MRI scan of the brain later today.  Continue ongoing stroke workup.  No family available for discussion.  Long discussion with patient and answered questions.This patient is critically ill and at significant risk of neurological worsening, death and care requires constant monitoring of vital signs, hemodynamics,respiratory and cardiac monitoring, extensive review of multiple databases, frequent neurological assessment, discussion with family, other specialists and medical decision making of high complexity.I have made any additions or clarifications directly to the above note.This critical  care time does not reflect procedure time, or teaching time or supervisory time of PA/NP/Med Resident etc but could involve care discussion time.  I spent 30 minutes of neurocritical care time  in the care of  this patient.      Antony Contras, MD Medical Director [facility] Pager: [phone] 02/26/2022 3:32 PM   To contact Stroke Continuity provider, please refer to [email or web address] After hours, contact General Neurology

## 2022-02-26 NOTE — Evaluation (Signed)
Occupational Therapy Evaluation Patient Details Name: Alexander Carroll MRN: [id number] DOB: January 14, 1965 Today's Date: 02/26/2022   History of Present Illness 57 y.o. male admitted on 02/25/22 for speech difficulty, left sided numbness and weakness.  CT with possible R temp hypodensity vs artifact.  Pt was given TNKase.  Of note, pt lost his home last month and has been in a hotel for 2 weeks.   Clinical Impression   Pt currently with functional limitations due to the deficits listed below (see OT Problem List). Prior to admit, pt was living with his wife, independent with ADL tasks, functional mobility, and working at [employer]. Pt reports that he lives in a house although per chart review, pt recently lost his home and has been living in a hotel.  Pt will benefit from skilled OT to increase their safety and independence with ADL and functional mobility for ADL to facilitate discharge to venue listed below. As long as pt progresses, he should do well with [place] although not sure how that will work with current living situation.       Recommendations for follow up therapy are one component of a multi-disciplinary discharge planning process, led by the attending physician.  Recommendations may be updated based on patient status, additional functional criteria and insurance authorization.   Follow Up Recommendations  Home health OT     Assistance Recommended at Discharge Intermittent Supervision/Assistance  Patient can return home with the following A little help with walking and/or transfers;A little help with bathing/dressing/bathroom;Help with stairs or ramp for entrance;Assist for transportation;Assistance with cooking/housework    Functional Status Assessment  Patient has had a recent decline in their functional status and demonstrates the ability to make significant improvements in function in a reasonable and predictable amount of time.  Equipment Recommendations  Other (comment) (TBD)        Precautions / Restrictions Precautions Precautions: Fall Precaution Comments: left sided weakness and hypersensitivity Restrictions Weight Bearing Restrictions: No      Mobility Bed Mobility Overal bed mobility: Modified Independent   Patient Response: Cooperative  Transfers Overall transfer level: Needs assistance Equipment used: Rolling walker (2 wheels) Transfers: Bed to chair/wheelchair/BSC, Sit to/from Stand Sit to Stand: Min assist     Step pivot transfers: Min assist     General transfer comment: VC for hand placement on RW and to push off of bed. Trialed transfer both with and without RW. Pt appeared to be more hesitant without RW and motor movements drastically lowered.      Balance Overall balance assessment: Needs assistance Sitting-balance support: No upper extremity supported, Feet supported Sitting balance-Leahy Scale: Good     Standing balance support: Bilateral upper extremity supported, During functional activity Standing balance-Leahy Scale: Good       ADL either performed or assessed with clinical judgement   ADL Overall ADL's : Needs assistance/impaired Eating/Feeding: Set up;Sitting   Grooming: Wash/dry hands;Wash/dry face;Oral care;Applying deodorant;Brushing hair;Minimal assistance;Sitting   Upper Body Bathing: Minimal assistance;Sitting   Lower Body Bathing: Maximal assistance;Sit to/from stand;Sitting/lateral leans   Upper Body Dressing : Minimal assistance;Sitting   Lower Body Dressing: Maximal assistance;Sit to/from stand;Sitting/lateral leans   Toilet Transfer: Minimal assistance;Stand-pivot;Ambulation;Rolling walker (2 wheels) Toilet Transfer Details (indicate cue type and reason): simulated to recliner Toileting- Clothing Manipulation and Hygiene: Moderate assistance;Sitting/lateral lean;Sit to/from stand         Vision Baseline Vision/History: 0 No visual deficits Ability to See in Adequate Light: 0 Adequate Patient Visual  Report: No change from baseline Vision Assessment?:  No apparent visual deficits Additional Comments: Not formally tested. During functional activity no concerns were noted.        Praxis Praxis Praxis-Other Comments: decreased motor movement and speed noted in both left upper and lower extremity.    Pertinent Vitals/Pain Pain Assessment Pain Intervention(s): Limited activity within patient's tolerance, Monitored during session, Repositioned     Hand Dominance Right   Extremity/Trunk Assessment Upper Extremity Assessment Upper Extremity Assessment: LUE deficits/detail;RUE deficits/detail RUE Deficits / Details: Full A/ROM with 4+/5 strength in all ranges. RUE Sensation: WNL RUE Coordination: WNL LUE Deficits / Details: Able to demonstrate ~75% shoulder flexion actively. Limited er and abduction during A/ROM although functional. MMT: 3-/5 shoulder flexion, abduction. Elbow flexion: 5/5, elbow extension: 3+/5, Wrist flexion/extension: 4/4. Impaired gross grasp. LUE Sensation: decreased light touch (Pt reports decreased sensation in left arm and leg. No numbness or tingling.) LUE Coordination: decreased fine motor;decreased gross motor   Lower Extremity Assessment Lower Extremity Assessment: Defer to PT evaluation    Cervical / Trunk Assessment Cervical / Trunk Assessment: Normal   Communication Communication Communication: Other (comment) (stuttering, child-like speech pattern)   Cognition Arousal/Alertness: Awake/alert Behavior During Therapy: WFL for tasks assessed/performed Overall Cognitive Status: Within Functional Limits for tasks assessed       General Comments  VSS during session            Home Living Family/patient expects to be discharged to:: Private residence Living Arrangements: Spouse/significant other Available Help at Discharge: Family;Available 24 hours/day (wife with h/o back surgery) Type of Home: Other(Comment) (pt told me house, per chart he lost  his home and is currently living in a hotel) Home Access: Level entry     Home Layout: One level     Bathroom Shower/Tub: Teacher, early years/pre: Standard     Home Equipment: None      Lives With: Spouse    Prior Functioning/Environment Prior Level of Function : Independent/Modified Independent     Mobility Comments: pt works full time at [employer], drives, wife does not work.          OT Problem List: Decreased strength;Decreased coordination;Pain;Decreased range of motion;Decreased activity tolerance;Impaired balance (sitting and/or standing);Decreased knowledge of use of DME or AE;Impaired UE functional use      OT Treatment/Interventions: Self-care/ADL training;Splinting;Therapeutic activities;Therapeutic exercise;Neuromuscular education;Energy conservation;DME and/or AE instruction;Patient/family education;Balance training;Manual therapy;Modalities    OT Goals(Current goals can be found in the care plan section) Acute Rehab OT Goals Patient Stated Goal: to get stronger OT Goal Formulation: With patient Time For Goal Achievement: 03/12/22 Potential to Achieve Goals: Good  OT Frequency: Min 2X/week       AM-PAC OT "6 Clicks" Daily Activity     Outcome Measure Help from another person eating meals?: A Little Help from another person taking care of personal grooming?: A Little Help from another person toileting, which includes using toliet, bedpan, or urinal?: A Lot Help from another person bathing (including washing, rinsing, drying)?: A Lot Help from another person to put on and taking off regular upper body clothing?: A Little Help from another person to put on and taking off regular lower body clothing?: A Lot 6 Click Score: 15   End of Session Equipment Utilized During Treatment: Gait belt;Rolling walker (2 wheels) Nurse Communication: Mobility status  Activity Tolerance: Patient tolerated treatment well Patient left: in chair;with call bell/phone  within reach;with chair alarm set  OT Visit Diagnosis: Unsteadiness on feet (R26.81);Muscle weakness (generalized) (M62.81);Ataxia, unspecified (R27.0);Other symptoms  and signs involving the nervous system (R29.898)                Time: [id number] OT Time Calculation (min): 32 min Charges:  OT General Charges $OT Visit: 1 Visit OT Evaluation $OT Eval High Complexity: 1 High  [employer], OTR/L,CBIS  Supplemental OT - [facility] and [facility] Secure Chat Preferred    Lisette Mancebo, Clarene Duke 02/26/2022, 3:45 PM

## 2022-02-26 NOTE — Progress Notes (Signed)
Code stroke  Call time  2130 [facility]   2136 End   2140 Soc   2140 Epic   2140 Rad called  2141

## 2022-02-26 NOTE — Progress Notes (Signed)
  Echocardiogram 2D Echocardiogram has been performed.  Alexander Carroll 02/26/2022, 11:22 AM

## 2022-02-26 NOTE — H&P (Signed)
NEUROLOGY CONSULTATION NOTE   Date of service: February 26, 2022 Patient Name: Alexander Carroll MRN:  [id number] DOB:  1965-05-04 _ _ _   _ __   _ __ _ _  __ __   _ __   __ _  History of Present Illness  Alexander Carroll is a 58 y.o. male with PMH significant for GERd, hiatal hernia who presented to [facility] ED. He was at home and was listening to race on the radio. He works 3rd shift at [employer] and got up at 2100 and fell and passed out. He was trembling and eyes were fluttering. He was not responding but breathing fine. He seemeed weak all over. This went on for 20 mins and wife calle dEMS. He was brought in to the ED at [facility] with confusion, stuttering speech, left sided numbness and hoarse and dysarthric speech.  Alexander Carroll with ? R temporal loss of grey white differentiation which seems to me more like artifact rather than true hypodensity. He was evaluated by Teleneurology and given tnkase. Further imaging with CTA and CTP with no LVO and no mismatch. He was transferred to [facility] Neuro ICU for further evaluation and workup.  LKW: 2100 mRS: 0 tNKASE: given at [facility] ED Thrombectomy: not offered 2/2 no LVO. NIHSS components Score: Comment  1a Level of Conscious 0[x]$  1[]$  2[]$  3[]$      1b LOC Questions 0[x]$  1[]$  2[]$       1c LOC Commands 0[x]$  1[]$  2[]$       2 Best Gaze 0[x]$  1[]$  2[]$       3 Visual 0[x]$  1[]$  2[]$  3[]$      4 Facial Palsy 0[x]$  1[]$  2[]$  3[]$      5a Motor Arm - left 0[]$  1[x]$  2[]$  3[]$  4[]$  UN[]$    5b Motor Arm - Right 0[x]$  1[]$  2[]$  3[]$  4[]$  UN[]$    6a Motor Leg - Left 0[]$  1[x]$  2[]$  3[]$  4[]$  UN[]$    6b Motor Leg - Right 0[x]$  1[]$  2[]$  3[]$  4[]$  UN[]$    7 Limb Ataxia 0[x]$  1[]$  2[]$  3[]$  UN[]$     8 Sensory 0[]$  1[]$  2[x]$  UN[]$      9 Best Language 0[x]$  1[]$  2[]$  3[]$      10 Dysarthria 0[]$  1[]$  2[x]$  UN[]$      11 Extinct. and Inattention 0[x]$  1[]$  2[]$       TOTAL: 6       ROS   Constitutional Denies weight loss, fever and chills.   HEENT Denies changes in vision and hearing.   Respiratory Denies SOB and cough.    CV Denies palpitations and CP   GI Denies abdominal pain, nausea, vomiting and diarrhea.   GU Denies dysuria and urinary frequency.   MSK Denies myalgia and joint pain.   Skin Denies rash and pruritus.   Neurological Denies headache and syncope.   Psychiatric Denies recent changes in mood. Denies anxiety and depression.    Past History   Past Medical History:  Diagnosis Date  . GERD (gastroesophageal reflux disease)   . Hiatal hernia    SMALL   Past Surgical History:  Procedure Laterality Date  . BIOPSY  11/12/2016   Procedure: BIOPSY;  Surgeon: Danie Binder, MD;  Location: [facility];  Service: Endoscopy;;  gastric and esophageal  . COLONOSCOPY  2018   [facility]: [facility]  . ESOPHAGOGASTRODUODENOSCOPY N/A 11/12/2016   Procedure: ESOPHAGOGASTRODUODENOSCOPY (EGD);  Surgeon: Danie Binder, MD;  Location: [facility];  Service: Endoscopy;  Laterality: N/A;  830   . SAVORY  DILATION N/A 11/12/2016   Procedure: SAVORY DILATION;  Surgeon: Danie Binder, MD;  Location: [facility];  Service: Endoscopy;  Laterality: N/A;   Family History  Problem Relation Age of Onset  . Kidney failure Mother   . Leukemia Father   . Colon cancer Neg Hx   . Gastric cancer Neg Hx   . Esophageal cancer Neg Hx    Social History   Socioeconomic History  . Marital status: Married    Spouse name: Not on file  . Number of children: Not on file  . Years of education: Not on file  . Highest education level: Not on file  Occupational History  . Not on file  Tobacco Use  . Smoking status: Never  . Smokeless tobacco: Never  Vaping Use  . Vaping Use: Never used  Substance and Sexual Activity  . Alcohol use: Not Currently  . Drug use: No  . Sexual activity: Yes  Other Topics Concern  . Not on file  Social History Narrative  . Not on file   Social Determinants of Health   Financial Resource Strain: Not on file  Food Insecurity: Not on file  Transportation Needs: Not on file   Physical Activity: Not on file  Stress: Not on file  Social Connections: Not on file   Allergies  Allergen Reactions  . Penicillins Hives    Has patient had a PCN reaction causing immediate rash, facial/tongue/throat swelling, SOB or lightheadedness with hypotension: Unknown Has patient had a PCN reaction causing severe rash involving mucus membranes or skin necrosis: Unknown Has patient had a PCN reaction that required hospitalization: Unknown Has patient had a PCN reaction occurring within the last 10 years: No Childhood reaction. If all of the above answers are "NO", then may proceed with Cephalosporin use.     Medications   Medications Prior to Admission  Medication Sig Dispense Refill Last Dose  . acetaminophen (TYLENOL) 500 MG tablet Take 500-1,000 mg by mouth every 6 (six) hours as needed for mild pain or moderate pain (for headaches.).      Marland Kitchen loratadine-pseudoephedrine (CLARITIN-D 12 HOUR) 5-120 MG tablet Take 1 tablet by mouth 2 (two) times daily. (Patient not taking: Reported on 01/26/2018) 20 tablet 0   . ondansetron (ZOFRAN) 4 MG tablet Take 1 tablet (4 mg total) by mouth 3 (three) times daily with meals as needed for nausea or vomiting. 30 tablet 1   . pantoprazole (PROTONIX) 40 MG tablet Take 1 tablet (40 mg total) by mouth 2 (two) times daily before a meal. 60 tablet 3   . promethazine (PHENERGAN) 12.5 MG tablet 1 po 30 MINS PRIOR TO MEALS OR EVERY 4 HRS TO PREVENT nausea or vomiting (Patient not taking: Reported on 11/02/2017) 60 tablet 3   . sucralfate (CARAFATE) 1 GM/10ML suspension Take 10 mLs (1 g total) by mouth 4 (four) times daily -  with meals and at bedtime. (Patient not taking: Reported on 08/25/2017) 420 mL 0      Vitals   Vitals:   02/25/22 2315 02/25/22 2330 02/25/22 2345 02/26/22 0000  BP: (!) 147/97 (!) 147/97 (!) 143/91 (!) 144/95  Pulse: 72 69 76 64  Resp: 17 18 18 17  $ Temp:      TempSrc:      SpO2: 96% 96% 98% 97%  Weight:      Height:          Body mass index is 32.37 kg/m.  Physical Exam   General: Laying  comfortably in bed; in no acute distress.  HENT: Normal oropharynx and mucosa. Normal external appearance of ears and nose.  Neck: Supple, no pain or tenderness  CV: No JVD. No peripheral edema.  Pulmonary: Symmetric Chest rise. Normal respiratory effort.  Abdomen: Soft to touch, non-tender.  Ext: No cyanosis, edema, or deformity  Skin: No rash. Normal palpation of skin.   Musculoskeletal: Normal digits and nails by inspection. No clubbing.   Neurologic Examination  Mental status/Cognition: Alert, oriented to self, place, month and year, good attention.  Speech/language: Stuttering speech with dysarthria, hypophonic and hoarse voice, fluent, comprehension intact, object naming intact, repetition intact.  Cranial nerves:   CN II Pupils equal and reactive to light, no VF deficits    CN III,IV,VI EOM intact, no gaze preference or deviation, no nystagmus    CN V normal sensation in V1, V2, and V3 segments bilaterally    CN VII no asymmetry, no nasolabial fold flattening    CN VIII normal hearing to speech    CN IX & X normal palatal elevation, no uvular deviation    CN XI 5/5 head turn and 5/5 shoulder shrug bilaterally    CN XII midline tongue protrusion    Motor:  Muscle bulk: normall, tone normal, pronator drift noted in LUE. Some giveaway weakness noted in left upper extremity and left lower extremity however, 5 out of 5 strength noted with encouragement. Mvmt Root Nerve  Muscle Right Left Comments  SA C5/6 Ax Deltoid 5 5   EF C5/6 Mc Biceps 5 5   EE C6/7/8 Rad Triceps 5 5   WF C6/7 Med FCR     WE C7/8 PIN ECU     F Ab C8/T1 U ADM/FDI 5 5   HF L1/2/3 Fem Illopsoas 5 5   KE L2/3/4 Fem Quad 5 5   DF L4/5 D Peron Tib Ant 5 5   PF S1/2 Tibial Grc/Sol 5 5    Sensation:  Light touch Decreased in left face, left upper extremity and left lower extremity to touch with midline splitting.   Pin prick    Temperature     Vibration   Proprioception    Coordination/Complex Motor:  - Finger to Nose intact BL - Heel to shin intact BL - Rapid alternating movement are normal - Gait: Deferred for patient's safety. Labs   CBC:  Recent Labs  Lab 02/25/22 2159 02/25/22 2203  WBC 5.2  --   NEUTROABS 3.2  --   HGB 16.0 15.6  HCT 45.2 46.0  MCV 84.2  --   PLT 160  --     Basic Metabolic Panel:  Lab Results  Component Value Date   NA 140 02/25/2022   K 3.6 02/25/2022   CO2 23 02/25/2022   GLUCOSE 116 (H) 02/25/2022   BUN 17 02/25/2022   CREATININE 1.00 02/25/2022   CALCIUM 8.5 (L) 02/25/2022   GFRNONAA >60 02/25/2022   GFRAA >60 08/17/2018   Lipid Panel:  Lab Results  Component Value Date   LDLCALC 82 07/29/2016   HgbA1c:  Lab Results  Component Value Date   HGBA1C 5.0 07/28/2016   Urine Drug Screen:     Component Value Date/Time   LABOPIA POSITIVE (A) 07/28/2016 1442   COCAINSCRNUR NONE DETECTED 07/28/2016 1442   LABBENZ NONE DETECTED 07/28/2016 1442   AMPHETMU NONE DETECTED 07/28/2016 1442   THCU NONE DETECTED 07/28/2016 1442   LABBARB NONE DETECTED 07/28/2016 1442    Alcohol Level  Component Value Date/Time   [facility] <10 02/25/2022 2159    CT Head without contrast(Personally reviewed): Question right temporal hypodensity which I feel like is an artifact. ASPECTS of 9.  CT angio Head and Neck with contrast along with CT perfusion (Personally reviewed): No LVO, no mismatch on CT Perfusion.  MRI Brain: pending  Impression   Alexander Carroll is a 57 y.o. male with PMH significant for GERD, hiatal hernia who presented to [facility] ED with stuttering speech, dysarthria, left sided numbness and left sided weakness. CTH with ?r temp hypodensity vs artifact. He was given tnkase, CTA with no LVO, CTP with no mismatch.  He was transferred to [facility] Neuro ICU for fruther workup and evaluation. On my evaluation, exam seems most suggestive of functional neurological deficit specially  with stuttering speech which almost never has a true organic etiology, he has midline splitting sensory deficit along with giveaway weakness which improves with encouragement.  He will be closely monitored in the neuro ICU for post TNKase neurochecks.  I discussed with patient and family and he is undergoing tremendous stress. Just lost his house last month and has been in a hotel for the last 2 weeks.  Recommendations   Functional neurological weakness vs stroke s/p tnkase: - Frequent NeuroChecks for post tNK care per stroke unit protocol: - Initial CTH demonstrated no acute hemorrhage or mass - MRI Brain - pending - TTE: pending. - CTA - no LVO - CT Perfusion - no mismatch - hold off on TTE given suspicion for functional neuro deficit rather than acute stroke. - Lipid Panel: LDL - pending  - Statin: if LDL is elevated. - HbA1c: pending. - Antithrombotic: Start ASA 81 mg daily if 24 h CTH does not show acute hemorrhage - DVT prophylaxis: SCDs. Pharmacologic prophylaxis if 24 h CTH does not demonstrate acute hemorrhage - Systolic Blood Pressure goal: < 180 mm Hg - Telemetry monitoring for arrhythmia: 72 hours - Swallow screen - ordered - PT/OT/SLP consults  GERD: - continue home PPI.  ______________________________________________________________________   Thank you for the opportunity to take part in the care of this patient. If you have any further questions, please contact the neurology consultation attending.  Signed,  [place] Pager Number [phone] _ _ _   _ __   _ __ _ _  __ __   _ __   __ _

## 2022-02-26 NOTE — Progress Notes (Signed)
[id number] made aware of read for advanced imaging.

## 2022-02-26 NOTE — Evaluation (Signed)
Physical Therapy Evaluation Patient Details Name: Alexander Carroll MRN: [id number] DOB: 07-26-1965 Today's Date: 02/26/2022  History of Present Illness  57 y.o. male admitted on 02/25/22 for speech difficulty, left sided numbness and weakness.  CT with possible R temp hypodensity vs artifact.  Pt was given TNKase.  Of note, pt lost his home last month and has been in a hotel for 2 weeks.  Clinical Impression  Pt with left arm and left leg weakness that does not match his functional mobility, however, he is ambulatory with min assist around the room, wide BOS and slow cadence.  Pt would benefit from acute therapy and HHPT f/u at discharge if still needed at that time.   PT to follow acutely for deficits listed below.        Recommendations for follow up therapy are one component of a multi-disciplinary discharge planning process, led by the attending physician.  Recommendations may be updated based on patient status, additional functional criteria and insurance authorization.  Follow Up Recommendations Home health PT      Assistance Recommended at Discharge Intermittent Supervision/Assistance  Patient can return home with the following  A little help with walking and/or transfers;A little help with bathing/dressing/bathroom    Equipment Recommendations None recommended by PT  Recommendations for Other Services       Functional Status Assessment Patient has had a recent decline in their functional status and demonstrates the ability to make significant improvements in function in a reasonable and predictable amount of time.     Precautions / Restrictions Precautions Precautions: Fall Precaution Comments: left sided weakness and hypersensitivity Restrictions Weight Bearing Restrictions: No      Mobility  Bed Mobility Overal bed mobility: Modified Independent                  Transfers Overall transfer level: Needs assistance Equipment used: 1 person hand held  assist Transfers: Sit to/from Stand Sit to Stand: Min assist           General transfer comment: Min assist to stabilize left knee for safety, assist in L hand for placement on rail to come to standing.    Ambulation/Gait Ambulation/Gait assistance: Min assist, +2 safety/equipment Gait Distance (Feet): 15 Feet Assistive device: 2 person hand held assist Gait Pattern/deviations: Step-through pattern, Wide base of support Gait velocity: decreased Gait velocity interpretation: <1.31 ft/sec, indicative of household ambulator   General Gait Details: Pt with slow, wide base gait pattern, no obvious functional weakness, buckling, or dragging of his left leg.  Min hand held assist for balance and refused RW.  Stairs            Wheelchair Mobility    Modified Rankin (Stroke Patients Only) Modified Rankin (Stroke Patients Only) Pre-Morbid Rankin Score: No symptoms Modified Rankin: Moderately severe disability     Balance Overall balance assessment: Mild deficits observed, not formally tested                                           Pertinent Vitals/Pain Pain Assessment Pain Assessment: Faces Faces Pain Scale: Hurts even more Pain Location: left arm and leg Pain Descriptors / Indicators: Burning Pain Intervention(s): Limited activity within patient's tolerance, Monitored during session, Repositioned    Home Living Family/patient expects to be discharged to:: Private residence Living Arrangements: Spouse/significant other Available Help at Discharge: Family;Available 24 hours/day (wife with h/o back  surgery) Type of Home: Other(Comment) (pt told me house, per chart he lost his home and is currently living in a hotel) Home Access: Level entry       Home Layout: One [place]: None      Prior Function Prior Level of Function : Independent/Modified Independent             Mobility Comments: pt works full time at [employer], drives, wife  does not work.       Hand Dominance   Dominant Hand: Right    Extremity/Trunk Assessment   Upper Extremity Assessment Upper Extremity Assessment: Defer to OT evaluation    Lower Extremity Assessment Lower Extremity Assessment: RLE deficits/detail RLE Deficits / Details: right leg with generalized weakness during MMT, sensation intact to LT RLE Sensation: WNL LLE Deficits / Details: left leg with demonstrated 3-/5 strength with MMT, however, functionally does not buckle or drag, just moves slowly. LLE Sensation:  (hypersensitive to LT)    Cervical / Trunk Assessment Cervical / Trunk Assessment: Normal  Communication   Communication: Other (comment) (stuttering, child-like speech pattern)  Cognition Arousal/Alertness: Awake/alert Behavior During Therapy: WFL for tasks assessed/performed Overall Cognitive Status: Within Functional Limits for tasks assessed                                          General Comments      Exercises     Assessment/Plan    PT Assessment Patient needs continued PT services  PT Problem List Decreased strength;Decreased range of motion;Decreased activity tolerance;Decreased balance;Decreased mobility;Decreased knowledge of use of DME;Impaired sensation;Obesity;Pain       PT Treatment Interventions DME instruction;Gait training;Stair training;Functional mobility training;Therapeutic exercise;Therapeutic activities;Balance training;Neuromuscular re-education;Patient/family education    PT Goals (Current goals can be found in the Care Plan section)  Acute Rehab PT Goals Patient Stated Goal: to get better PT Goal Formulation: With patient Time For Goal Achievement: 03/13/22 Potential to Achieve Goals: Good    Frequency Min 4X/week     Co-evaluation               AM-PAC PT "6 Clicks" Mobility  Outcome Measure Help needed turning from your back to your side while in a flat bed without using bedrails?: None Help  needed moving from lying on your back to sitting on the side of a flat bed without using bedrails?: None Help needed moving to and from a bed to a chair (including a wheelchair)?: A Little Help needed standing up from a chair using your arms (e.g., wheelchair or bedside chair)?: A Little Help needed to walk in hospital room?: A Little Help needed climbing 3-5 steps with a railing? : A Little 6 Click Score: 20    End of Session Equipment Utilized During Treatment: Gait belt Activity Tolerance: Patient limited by fatigue;Patient limited by pain;Other (comment) (by N/V and constipation) Patient left: in chair;with call bell/phone within reach;with chair alarm set Nurse Communication: Mobility status PT Visit Diagnosis: Muscle weakness (generalized) (M62.81);Difficulty in walking, not elsewhere classified (R26.2);Pain Pain - Right/Left: Left Pain - part of body: Arm;Leg    Time: 1202-1309 PT Time Calculation (min) (ACUTE ONLY): 67 min   Charges:   PT Evaluation $PT Eval Moderate Complexity: 1 Mod PT Treatments $Gait Training: 23-37 mins $Therapeutic Activity: 23-37 mins        Verdene Lennert, PT, DPT  Acute Rehabilitation Secure chat  is best for contact #[phone] office

## 2022-02-26 NOTE — Evaluation (Signed)
Speech Language Pathology Evaluation Patient Details Name: Alexander Carroll MRN: [id number] DOB: 10-12-1965 Today's Date: 02/26/2022 Time: [id number] SLP Time Calculation (min) (ACUTE ONLY): 20 min  Problem List:  Patient Active Problem List   Diagnosis Date Noted   Stroke determined by clinical assessment ([place]) 02/26/2022   CVA (cerebral vascular accident) ([place]) 02/25/2022   Colon cancer screening 11/02/2017   GERD (gastroesophageal reflux disease) 10/04/2016   Dysphagia 10/04/2016   Chest pain 07/28/2016   Nausea and vomiting 07/28/2016   Past Medical History:  Past Medical History:  Diagnosis Date   GERD (gastroesophageal reflux disease)    Hiatal hernia    SMALL   Past Surgical History:  Past Surgical History:  Procedure Laterality Date   BIOPSY  11/12/2016   Procedure: BIOPSY;  Surgeon: Danie Binder, MD;  [facility]: [facility];  Service: Endoscopy;;  gastric and esophageal   COLONOSCOPY  2018   [facility]: [facility]   ESOPHAGOGASTRODUODENOSCOPY N/A 11/12/2016   Procedure: ESOPHAGOGASTRODUODENOSCOPY (EGD);  Surgeon: Danie Binder, MD;  Location: [facility];  Service: Endoscopy;  Laterality: N/A;  830    SAVORY DILATION N/A 11/12/2016   Procedure: SAVORY DILATION;  Surgeon: Danie Binder, MD;  Location: [facility];  Service: Endoscopy;  Laterality: N/A;   HPI:  Pt is a 57 yo male presenting [facility] with confusion, stuttering speech, L sided numbness, hoarse and dysarthric speech. PMH includes GERD and hiatal hernia.   Assessment / Plan / Recommendation Clinical Impression  Pt demonstrates distorted/dysarthric speech. Language and cognition appears relatively in tact. Features of speech changes include altered rate and prosody, impaired nasal resonance, slightly impaired phonemic placement. 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  Pt stimulagle for cueing in session. Will f/u with further interventions while admitted and suggest OP SLP after d/c.    SLP Assessment  SLP Recommendation/Assessment: Patient needs continued Speech [place] [facility] SLP Visit Diagnosis: Dysarthria and anarthria (R47.1)    Recommendations for follow up therapy are one component of a multi-disciplinary discharge planning process, led by the attending physician.  Recommendations may be updated based on patient status, additional functional criteria and insurance authorization.    Follow Up Recommendations  Outpatient SLP    Assistance Recommended at Discharge  PRN  Functional Status Assessment Patient has had a recent decline in their functional status and demonstrates the ability to make significant improvements in function in a reasonable and predictable amount of time.  Frequency and Duration min 2x/week  2 weeks      SLP Evaluation Cognition  Overall Cognitive Status: Within Functional Limits for tasks assessed Arousal/Alertness: Awake/alert Orientation Level: Oriented X4 Attention: Selective Selective Attention: Appears intact Memory: Appears intact Awareness: Appears intact Problem Solving: Appears intact       Comprehension  Auditory Comprehension Overall Auditory Comprehension: Impaired Commands: Impaired Two Step Basic Commands: 0-24% accurate Visual Recognition/Discrimination Discrimination: Within Function Limits Reading Comprehension Reading Status: Within funtional limits    Expression Expression Primary Mode of Expression: Verbal Verbal Expression Overall Verbal Expression: Appears within functional limits for tasks assessed Written Expression Dominant Hand: Right   Oral / Motor  Oral Motor/Sensory Function Overall Oral Motor/Sensory Function: Within functional limits Motor Speech Overall Motor Speech: Impaired Respiration: Within functional limits Phonation: Normal Resonance:   (impaired) Articulation: Impaired Level of Impairment: Conversation Intelligibility: Intelligibility reduced Conversation: 75-100%  accurate Motor Planning: Witnin functional limits Interfering Components: Inadequate dentition Effective Techniques: Slow rate;Increased vocal intensity;Over-articulate            Ario Mcdiarmid, Katherene Ponto 02/26/2022, 3:23 PM

## 2022-02-27 DIAGNOSIS — F449 Dissociative and conversion disorder, unspecified: Secondary | ICD-10-CM | POA: Diagnosis not present

## 2022-02-27 LAB — RAPID URINE DRUG SCREEN, HOSP PERFORMED
Amphetamines: NOT DETECTED
Barbiturates: NOT DETECTED
Benzodiazepines: NOT DETECTED
Cocaine: NOT DETECTED
Opiates: NOT DETECTED
Tetrahydrocannabinol: NOT DETECTED

## 2022-02-27 LAB — BASIC METABOLIC PANEL
Anion gap: 7 (ref 5–15)
BUN: 16 mg/dL (ref 6–20)
CO2: 25 mmol/L (ref 22–32)
Calcium: 8.4 mg/dL — ABNORMAL LOW (ref 8.9–10.3)
Chloride: 106 mmol/L (ref 98–111)
Creatinine, Ser: 1.12 mg/dL (ref 0.61–1.24)
GFR, Estimated: >60 mL/min (ref >60)
Glucose, Bld: 128 mg/dL — ABNORMAL HIGH (ref 70–99)
Potassium: 3.4 mmol/L — ABNORMAL LOW (ref 3.5–5.1)
Sodium: 138 mmol/L (ref 135–145)

## 2022-02-27 LAB — URINALYSIS, ROUTINE W REFLEX MICROSCOPIC
Bilirubin Urine: NEGATIVE
Glucose, UA: NEGATIVE mg/dL
Ketones, ur: NEGATIVE mg/dL
Nitrite: NEGATIVE
Protein, ur: NEGATIVE mg/dL
Specific Gravity, Urine: 1.025 (ref 1.005–1.030)
pH: 5 (ref 5.0–8.0)

## 2022-02-27 LAB — CBC
HCT: 42.4 % (ref 39.0–52.0)
Hemoglobin: 15.1 g/dL (ref 13.0–17.0)
MCH: 30.1 pg (ref 26.0–34.0)
MCHC: 35.6 g/dL (ref 30.0–36.0)
MCV: 84.6 fL (ref 80.0–100.0)
Platelets: 155 10*3/uL (ref 150–400)
RBC: 5.01 MIL/uL (ref 4.22–5.81)
RDW: 13.3 % (ref 11.5–15.5)
WBC: 5.9 10*3/uL (ref 4.0–10.5)
nRBC: 0 % (ref 0.0–0.2)

## 2022-02-27 MED ORDER — ROSUVASTATIN CALCIUM 20 MG PO TABS: 20.0000 mg | ORAL_TABLET | Freq: Every day | ORAL | 1 refills | Status: DC

## 2022-02-27 MED ORDER — BISACODYL 10 MG RE SUPP
10.0000 mg | Freq: Once | RECTAL | Status: AC
  Administered 2022-02-27: 10 mg via RECTAL
  Filled 2022-02-27: qty 1

## 2022-02-27 MED ORDER — POTASSIUM CHLORIDE CRYS ER 20 MEQ PO TBCR
20.0000 meq | EXTENDED_RELEASE_TABLET | Freq: Once | ORAL | Status: AC
  Administered 2022-02-27: 20 meq via ORAL
  Filled 2022-02-27: qty 1

## 2022-02-27 NOTE — Progress Notes (Signed)
Physical Therapy Treatment Patient Details Name: Alexander Carroll MRN: [id number] DOB: 17-Mar-1965 Today's Date: 02/27/2022   History of Present Illness 57 y.o. male admitted on 02/25/22 for speech difficulty, left sided numbness and weakness.  CT with possible R temp hypodensity vs artifact.  Pt was given TNKase.  Of note, pt lost his home last month and has been in a hotel for 2 weeks.    PT Comments    Patient with dragging of LLE during gait (got progressively worse as pt fatigued over only 25 ft of ambulation). Walked with HHA, however feel pt will do better with RW and will use next visit.    Recommendations for follow up therapy are one component of a multi-disciplinary discharge planning process, led by the attending physician.  Recommendations may be updated based on patient status, additional functional criteria and insurance authorization.  Follow Up Recommendations  Home health PT     Assistance Recommended at Discharge Intermittent Supervision/Assistance  Patient can return home with the following A little help with walking and/or transfers;A little help with bathing/dressing/bathroom   Equipment Recommendations  None recommended by PT    Recommendations for Other Services       Precautions / Restrictions Precautions Precautions: Fall Precaution Comments: left sided weakness and hypersensitivity Restrictions Weight Bearing Restrictions: No     Mobility  Bed Mobility Overal bed mobility: Modified Independent                  Transfers Overall transfer level: Needs assistance Equipment used: None Transfers: Sit to/from Stand Sit to Stand: Min guard           General transfer comment: Pt with wide BOS and no imbalance with coming to stand; bradykinetic/cautious    Ambulation/Gait Ambulation/Gait assistance: Min assist Gait Distance (Feet): 25 Feet Assistive device: 1 person hand held assist Gait Pattern/deviations: Step-through pattern, Wide base of  support, Decreased weight shift to left, Decreased stance time - left, Decreased step length - left, Decreased dorsiflexion - left Gait velocity: decreased Gait velocity interpretation: <1.31 ft/sec, indicative of household ambulator   General Gait Details: very slow; wide BOS with lt foot lagging behind and at times dragging, especially with fatigue   Stairs             Wheelchair Mobility    Modified Rankin (Stroke Patients Only) Modified Rankin (Stroke Patients Only) Pre-Morbid Rankin Score: No symptoms Modified Rankin: Moderately severe disability     Balance Overall balance assessment: Needs assistance Sitting-balance support: No upper extremity supported, Feet supported Sitting balance-Leahy Scale: Good     Standing balance support: During functional activity, No upper extremity supported Standing balance-Leahy Scale: Fair Standing balance comment: dynamically needs UE support                            Cognition Arousal/Alertness: Awake/alert Behavior During Therapy: WFL for tasks assessed/performed Overall Cognitive Status: Difficult to assess                                 General Comments: delayed responses but ?due to language issues        Exercises General Exercises - Lower Extremity Long Arc Quad: AROM, Left, 10 reps    General Comments        Pertinent Vitals/Pain Pain Assessment Pain Assessment: Faces Faces Pain Scale: No hurt    Home Living  Prior Function            PT Goals (current goals can now be found in the care plan section) Acute Rehab PT Goals Patient Stated Goal: to get better Time For Goal Achievement: 03/13/22 Potential to Achieve Goals: Good Progress towards PT goals: Progressing toward goals    Frequency    Min 4X/week      PT Plan Current plan remains appropriate    Co-evaluation              AM-PAC PT "6 Clicks" Mobility   Outcome  Measure  Help needed turning from your back to your side while in a flat bed without using bedrails?: None Help needed moving from lying on your back to sitting on the side of a flat bed without using bedrails?: None Help needed moving to and from a bed to a chair (including a wheelchair)?: A Little Help needed standing up from a chair using your arms (e.g., wheelchair or bedside chair)?: A Little Help needed to walk in hospital room?: A Little Help needed climbing 3-5 steps with a railing? : A Little 6 Click Score: 20    End of Session Equipment Utilized During Treatment: Gait belt Activity Tolerance: Patient limited by fatigue Patient left: in chair;with call bell/phone within reach;with chair alarm set Nurse Communication: Mobility status PT Visit Diagnosis: Muscle weakness (generalized) (M62.81);Difficulty in walking, not elsewhere classified (R26.2);Pain Pain - Right/Left: Left Pain - part of body: Arm;Leg     Time: [id number] PT Time Calculation (min) (ACUTE ONLY): 17 min  Charges:  $Gait Training: 8-22 mins                      [place]  Office [phone]    Rexanne Mano 02/27/2022, 9:25 AM

## 2022-02-27 NOTE — Progress Notes (Signed)
Patient was educated regarding follow-up appointments.  All questions were answered.  Patient's IV was removed.  RN then brought patient down to his ride and watched him get in car and leave with his wife.

## 2022-02-27 NOTE — Discharge Summary (Addendum)
Stroke Discharge Summary  Patient ID: Alexander Carroll   MRN: [id number]      DOB: 1965/02/04  Date of Admission: 02/25/2022 Date of Discharge: 02/27/2022  Attending Physician:  Stroke, Md, MD, Stroke MD Consultant(s):    None  Patient's PCP:  [facility]  DISCHARGE DIAGNOSIS: Strokelike episode with functional neurological symptoms of left-sided numbness and stuttering speech status post TNK Active Problems:   * No active hospital problems. *   Allergies as of 02/27/2022       Reactions   Penicillins Hives, Itching   Has patient had a PCN reaction causing immediate rash, facial/tongue/throat swelling, SOB or lightheadedness with hypotension: Unknown Has patient had a PCN reaction causing severe rash involving mucus membranes or skin necrosis: Unknown Has patient had a PCN reaction that required hospitalization: Unknown Has patient had a PCN reaction occurring within the last 10 years: No Childhood reaction. If all of the above answers are "NO", then may proceed with Cephalosporin use.        Medication List     TAKE these medications    acetaminophen 500 MG tablet Commonly known as: TYLENOL Take 1,000 mg by mouth as needed for mild pain, moderate pain or headache.   famotidine 40 MG tablet Commonly known as: PEPCID Take 40 mg by mouth daily.   rosuvastatin 20 MG tablet Commonly known as: CRESTOR Take 1 tablet (20 mg total) by mouth daily. Start taking on: February 28, 2022   TUMS PO Take 1-2 tablets by mouth as needed (heartburn).        LABORATORY STUDIES CBC    Component Value Date/Time   WBC 5.9 02/27/2022 0301   RBC 5.01 02/27/2022 0301   HGB 15.1 02/27/2022 0301   HCT 42.4 02/27/2022 0301   PLT 155 02/27/2022 0301   MCV 84.6 02/27/2022 0301   MCH 30.1 02/27/2022 0301   MCHC 35.6 02/27/2022 0301   RDW 13.3 02/27/2022 0301   LYMPHSABS 0.9 02/25/2022 2159   MONOABS 0.8 02/25/2022 2159   EOSABS 0.3 02/25/2022 2159   BASOSABS  0.1 02/25/2022 2159   CMP    Component Value Date/Time   NA 138 02/27/2022 0301   K 3.4 (L) 02/27/2022 0301   CL 106 02/27/2022 0301   CO2 25 02/27/2022 0301   GLUCOSE 128 (H) 02/27/2022 0301   BUN 16 02/27/2022 0301   CREATININE 1.12 02/27/2022 0301   CALCIUM 8.4 (L) 02/27/2022 0301   PROT 7.3 02/25/2022 2159   ALBUMIN 4.3 02/25/2022 2159   AST 27 02/25/2022 2159   ALT 38 02/25/2022 2159   ALKPHOS 76 02/25/2022 2159   BILITOT 0.9 02/25/2022 2159   GFRNONAA >60 02/27/2022 0301   GFRAA >60 08/17/2018 1040   COAGS Lab Results  Component Value Date   INR 1.0 02/25/2022   Lipid Panel    Component Value Date/Time   CHOL 131 02/26/2022 0440   TRIG 73 02/26/2022 0440   HDL 32 (L) 02/26/2022 0440   CHOLHDL 4.1 02/26/2022 0440   VLDL 15 02/26/2022 0440   LDLCALC 84 02/26/2022 0440   HgbA1C  Lab Results  Component Value Date   HGBA1C 4.8 02/26/2022   Urinalysis    Component Value Date/Time   COLORURINE YELLOW 02/27/2022 0111   APPEARANCEUR CLEAR 02/27/2022 0111   LABSPEC 1.025 02/27/2022 0111   PHURINE 5.0 02/27/2022 0111   GLUCOSEU NEGATIVE 02/27/2022 0111   HGBUR SMALL (A) 02/27/2022 0111   BILIRUBINUR NEGATIVE 02/27/2022 0111  KETONESUR NEGATIVE 02/27/2022 0111   PROTEINUR NEGATIVE 02/27/2022 0111   NITRITE NEGATIVE 02/27/2022 0111   LEUKOCYTESUR MODERATE (A) 02/27/2022 0111   Urine Drug Screen     Component Value Date/Time   LABOPIA NONE DETECTED 02/27/2022 0111   COCAINSCRNUR NONE DETECTED 02/27/2022 0111   LABBENZ NONE DETECTED 02/27/2022 0111   AMPHETMU NONE DETECTED 02/27/2022 0111   THCU NONE DETECTED 02/27/2022 0111   LABBARB NONE DETECTED 02/27/2022 0111    Alcohol Level    Component Value Date/Time   ETH <10 02/25/2022 2159     SIGNIFICANT DIAGNOSTIC STUDIES MR BRAIN WO CONTRAST  Result Date: 02/26/2022 CLINICAL DATA:  Neuro deficit, acute, stroke suspected EXAM: MRI HEAD WITHOUT CONTRAST TECHNIQUE: Multiplanar, multiecho pulse sequences  of the brain and surrounding structures were obtained without intravenous contrast. COMPARISON:  CTA head/neck February 15, 24. FINDINGS: Brain: No acute infarction, hemorrhage, hydrocephalus, extra-axial collection or mass lesion. Vascular: Major arterial flow voids are maintained at the skull base. Skull and upper cervical spine: Normal marrow signal. Sinuses/Orbits: Minimal paranasal sinus mucosal thickening. No acute orbital findings. Other: No sizable mastoid effusions. IMPRESSION: No evidence of acute intracranial abnormality. Electronically Signed   By: Margaretha Sheffield M.D.   On: 02/26/2022 19:31   ECHOCARDIOGRAM COMPLETE  Result Date: 02/26/2022    ECHOCARDIOGRAM REPORT   Patient Name:   Alexander Carroll Date of Exam: 02/26/2022 Medical Rec #:  [id number]     Height:       74.0 in Accession #:    [id number]    Weight:       252.1 lb Date of Birth:  28-Dec-1965     BSA:          2.398 m Patient Age:    57 years      BP:           121/86 mmHg Patient Gender: M             HR:           69 bpm. Exam Location:  Inpatient Procedure: 2D Echo, Cardiac Doppler and Color Doppler Indications:    Stroke I63.9  History:        Patient has no prior history of Echocardiogram examinations.                 Stroke; Risk Factors:Non-Smoker.  Sonographer:    Greer Pickerel Referring Phys: [id number] [facility]  Sonographer Comments: No subcostal window. Image acquisition challenging due to patient body habitus and Image acquisition challenging due to respiratory motion. IMPRESSIONS  1. Left ventricular ejection fraction, by estimation, is 60 to 65%. The left ventricle has normal function. The left ventricle has no regional wall motion abnormalities. Left ventricular diastolic parameters are consistent with Grade I diastolic dysfunction (impaired relaxation).  2. Right ventricular systolic function is normal. The right ventricular size is moderately enlarged.  3. Right atrial size was moderately dilated.  4. The mitral valve  is normal in structure. No evidence of mitral valve regurgitation. No evidence of mitral stenosis.  5. The aortic valve is tricuspid. Aortic valve regurgitation is not visualized. No aortic stenosis is present.  6. The inferior vena cava is normal in size with greater than 50% respiratory variability, suggesting right atrial pressure of 3 mmHg. Conclusion(s)/Recommendation(s): No intracardiac source of embolism detected on this transthoracic study. Consider a transesophageal echocardiogram to exclude cardiac source of embolism if clinically indicated. FINDINGS  Left Ventricle: Left ventricular ejection fraction, by estimation, is 60 to 65%.  The left ventricle has normal function. The left ventricle has no regional wall motion abnormalities. The left ventricular internal cavity size was normal in size. There is  no left ventricular hypertrophy. Left ventricular diastolic parameters are consistent with Grade I diastolic dysfunction (impaired relaxation). Right Ventricle: The right ventricular size is moderately enlarged. No increase in right ventricular wall thickness. Right ventricular systolic function is normal. Left Atrium: Left atrial size was normal in size. Right Atrium: Right atrial size was moderately dilated. Pericardium: There is no evidence of pericardial effusion. Thickening/calcification of pericardium present. Mitral Valve: The mitral valve is normal in structure. No evidence of mitral valve regurgitation. No evidence of mitral valve stenosis. Tricuspid Valve: The tricuspid valve is normal in structure. Tricuspid valve regurgitation is not demonstrated. No evidence of tricuspid stenosis. Aortic Valve: The aortic valve is tricuspid. Aortic valve regurgitation is not visualized. No aortic stenosis is present. Pulmonic Valve: The pulmonic valve was normal in structure. Pulmonic valve regurgitation is not visualized. No evidence of pulmonic stenosis. Aorta: The aortic root is normal in size and structure.  Venous: The inferior vena cava is normal in size with greater than 50% respiratory variability, suggesting right atrial pressure of 3 mmHg. IAS/Shunts: No atrial level shunt detected by color flow Doppler.  LEFT VENTRICLE PLAX 2D LVIDd:         4.10 cm   Diastology LVIDs:         2.60 cm   LV e' medial:    6.42 cm/s LV PW:         1.40 cm   LV E/e' medial:  6.6 LV IVS:        1.00 cm   LV e' lateral:   6.74 cm/s LVOT diam:     2.30 cm   LV E/e' lateral: 6.3 LV SV:         71 LV SV Index:   29 LVOT Area:     4.15 cm  RIGHT VENTRICLE RV S prime:     15.30 cm/s TAPSE (M-mode): 2.2 cm LEFT ATRIUM           Index        RIGHT ATRIUM           Index LA diam:      3.60 cm 1.50 cm/m   RA Area:     35.60 cm LA Vol (A2C): 44.8 ml 18.68 ml/m  RA Volume:   141.00 ml 58.79 ml/m LA Vol (A4C): 63.9 ml 26.65 ml/m  AORTIC VALVE LVOT Vmax:   73.60 cm/s LVOT Vmean:  47.800 cm/s LVOT VTI:    0.170 m  AORTA Ao Root diam: 4.00 cm Ao Asc diam:  3.00 cm MITRAL VALVE               TRICUSPID VALVE MV Area (PHT): 4.06 cm    TR Peak grad:   4.0 mmHg MV Decel Time: 187 msec    TR Vmax:        99.60 cm/s MR Peak grad: 2.2 mmHg MR Vmax:      74.90 cm/s   SHUNTS MV E velocity: 42.20 cm/s  Systemic VTI:  0.17 m MV A velocity: 54.80 cm/s  Systemic Diam: 2.30 cm MV E/A ratio:  0.77 Candee Furbish MD Electronically signed by Candee Furbish MD Signature Date/Time: 02/26/2022/12:43:27 PM    Final    CT ANGIO HEAD NECK W WO CM W PERF (CODE STROKE)  Result Date: 02/26/2022 CLINICAL DATA:  Syncopal episode, woke unable  to talk and weakness EXAM: CT ANGIOGRAPHY HEAD AND NECK CT PERFUSION BRAIN TECHNIQUE: Multidetector CT imaging of the head and neck was performed using the standard protocol during bolus administration of intravenous contrast. Multiplanar CT image reconstructions and MIPs were obtained to evaluate the vascular anatomy. Carotid stenosis measurements (when applicable) are obtained utilizing NASCET criteria, using the distal internal  carotid diameter as the denominator. Multiphase CT imaging of the brain was performed following IV bolus contrast injection. Subsequent parametric perfusion maps were calculated using RAPID software. RADIATION DOSE REDUCTION: This exam was performed according to the departmental dose-optimization program which includes automated exposure control, adjustment of the mA and/or kV according to patient size and/or use of iterative reconstruction technique. CONTRAST:  132m OMNIPAQUE IOHEXOL 350 MG/ML SOLN COMPARISON:  CT head 02/25/2022, no prior CTA FINDINGS: CT HEAD FINDINGS For noncontrast findings, please see same day CT head. CTA NECK FINDINGS Aortic arch: Standard branching. Imaged portion shows no evidence of aneurysm or dissection. No significant stenosis of the major arch vessel origins. Right carotid system: No evidence of stenosis, dissection, or occlusion. Left carotid system: No evidence of stenosis, dissection, or occlusion. Vertebral arteries: No evidence of stenosis, dissection, or occlusion. Skeleton: No acute osseous abnormality.  Edentulous. Other neck: Negative. Upper chest: No focal pulmonary opacity or pleural effusion. Review of the MIP images confirms the above findings CTA HEAD FINDINGS Anterior circulation: Both internal carotid arteries are patent to the termini, without significant stenosis. A1 segments patent. Normal anterior communicating artery. Anterior cerebral arteries are patent to their distal aspects. No M1 stenosis or occlusion. MCA branches perfused and symmetric. Posterior circulation: Vertebral arteries patent to the vertebrobasilar junction without stenosis. Posterior inferior cerebellar arteries patent proximally. Basilar patent to its distal aspect. Superior cerebellar arteries patent proximally. Patent P1 segments. PCAs perfused to their distal aspects without stenosis. The bilateral posterior communicating arteries are diminutive but patent. Venous sinuses: As permitted by  contrast timing, patent. Anatomic variants: None significant. Review of the MIP images confirms the above findings CT Brain Perfusion Findings: ASPECTS: 9 CBF (<30%) Volume: 08mPerfusion (Tmax>6.0s) volume: 71m34mismatch Volume: 71mL84mfarction Location:None IMPRESSION: 1. No intracranial large vessel occlusion or significant stenosis. 2. No hemodynamically significant stenosis in the neck. 3. Perfusion imaging demonstrates no core infarct or penumbra. Electronically Signed   By: AlisMerilyn Baba.   On: 02/26/2022 00:09   DG Chest Portable 1 View  Result Date: 02/25/2022 CLINICAL DATA:  Altered mental status, syncopal episode. EXAM: PORTABLE CHEST 1 VIEW COMPARISON:  08/23/2020. FINDINGS: Heart is enlarged and the mediastinal contour is stable. Lung volumes are low. Interstitial prominence is noted bilaterally. No effusion or pneumothorax. No acute osseous abnormality. IMPRESSION: 1. Cardiomegaly. 2. Interstitial prominence bilaterally, possible edema or infiltrate. Electronically Signed   By: LaurBrett Fairy.   On: 02/25/2022 22:56   CT HEAD CODE STROKE WO CONTRAST  Result Date: 02/25/2022 CLINICAL DATA:  Code stroke. Witnessed syncopal episode, unable to talk and weakness upon waking EXAM: CT HEAD WITHOUT CONTRAST TECHNIQUE: Contiguous axial images were obtained from the base of the skull through the vertex without intravenous contrast. RADIATION DOSE REDUCTION: This exam was performed according to the departmental dose-optimization program which includes automated exposure control, adjustment of the mA and/or kV according to patient size and/or use of iterative reconstruction technique. COMPARISON:  None Available. FINDINGS: Brain: Hypodensity in the right temporal lobe with apparent loss of gray-white differentiation (series 2, image 12); this may be artifactual but is asymmetric and  indeterminate. No evidence of acute hemorrhage, mass, mass effect, or midline shift. No hydrocephalus or extra-axial  collection. Vascular: No hyperdense vessel. Skull: Negative for fracture or focal lesion. Sinuses/Orbits: No acute finding. Other: The mastoid air cells are well aerated. ASPECTS [facility] Stroke Program Early CT Score) - Ganglionic level infarction (caudate, lentiform nuclei, internal capsule, insula, M1-M3 cortex): 6 - Supraganglionic infarction (M4-M6 cortex): 3 Total score (0-10 with 10 being normal): 9 IMPRESSION: 1. Hypodensity in the right temporal lobe with apparent loss of gray-white differentiation, which may be artifactual but is asymmetric and indeterminate. Recommend MRI for further evaluation. 2. No acute intracranial hemorrhage. 3. ASPECTS is 9. Code stroke imaging results were communicated on 02/25/2022 at 9:54 pm to provider [facility] via telephone, who verbally acknowledged these results. Electronically Signed   By: Merilyn Baba M.D.   On: 02/25/2022 21:55      HISTORY OF PRESENT ILLNESS Patient with a history of GERD and hiatal hernia fell over while getting out of bed and passed out with some trembling noted.  For about 20 minutes after waking up, he had trembling of the hands and fluttering of his eyes.  EMS was called, and patient presented to the ED with confusion, stuttering dysarthric speech and left-sided numbness.  He was given TNK to treat possible stroke.  HOSPITAL COURSE Patient's MRI was noted to be negative for acute stroke, and stuttering speech improved spontaneously.  Patient's symptoms appear functional on exam with forehead splitting to vibration.  Patient has had increased stress in his life recently, which may be the cause of his functional symptoms.  Stroke like episode status post TNK versus functional neurological symptoms Code Stroke CT head hypodensity in right temporal lobe, which may be artifactual ASPECTS 9 CTA head & neck no LVO or hemodynamically significant stenosis CT perfusion no core or penumbra seen MRI no acute abnormality 2D Echo EF 60 to 65%, grade  1 diastolic dysfunction, normal left atrial size, no atrial level shunt LDL 84 HgbA1c 4.8 VTE prophylaxis -SCDs       Diet    Diet regular Room service appropriate? Yes; Fluid consistency: Thin        No antithrombotic prior to admission, now on No antithrombotic as he has not had a stroke Therapy recommendations: Home health PT/OT Disposition: Home   Hypertension Home meds: None Stable Keep blood pressure less than 180/105 Long-term BP goal normotensive   Hyperlipidemia Home meds: None LDL 84, goal < 70 Add rosuvastatin 20 mg daily Continue statin at discharge   Other Stroke Risk Factors Obesity, Body mass index is 32.37 kg/m., BMI >/= 30 associated with increased stroke risk, recommend weight loss, diet and exercise as appropriate  RN Pressure Injury Documentation:     DISCHARGE EXAM Blood pressure 131/85, pulse 85, temperature 97.8 F (36.6 C), temperature source Axillary, resp. rate 19, height 6' 2"$  (1.88 m), weight 114.4 kg, SpO2 93 %. General: Alert, well-nourished, well-developed patient in no acute distress Respiratory: Regular, unlabored respirations on room air  NEURO:  Mental Status: AA&Ox3  Speech/Language: speech is without dysarthria or aphasia.  Some stuttering of speech is noted, but this is improved from yesterday  Cranial Nerves:  II: PERRL.  III, IV, VI: EOMI. Eyelids elevate symmetrically.  VII: Smile is symmetrical.   VIII: hearing intact to voice. IX, X: Phonation is normal.  XII: tongue is midline without fasciculations. Motor: 5/5 strength to all muscle groups tested.  Tone: is normal and bulk is normal Sensation- Intact to  light touch bilaterally.  Gait- deferred   Discharge Diet       Diet   Diet regular Room service appropriate? Yes; Fluid consistency: Thin   liquids  DISCHARGE PLAN Disposition: Home No antithrombotic needed as patient has not had a stroke Ongoing stroke risk factor control by Primary Care Physician at time  of discharge Follow-up PCP Pllc, [facility] in 2 weeks. Follow-up in [facility] in 8 weeks, office to schedule an appointment.   25 minutes were spent preparing discharge.  [place] , MSN, AGACNP-BC [facility] See Amion for schedule and pager information 02/27/2022 1:03 PM

## 2022-02-27 NOTE — TOC Transition Note (Signed)
Transition of Care [facility]) - CM/SW Discharge Note   Patient Details  Name: Alexander Carroll MRN: [id number] Date of Birth: [birth year]  [facility]) CM/SW Contact:  Tom-Johnson, Renea Ee, RN Phone Number: 02/27/2022, 2:32 PM   Clinical Narrative:     Patient is scheduled for discharge today. Home health PT/OT recommended, patient has Pharmacist, community and will have to pay high co pay out of pocket. MD consult for Outpatient PT/OT. Order and referral placed for [facility] outpatient Rehab in [place], info on AVS.  Wife at bedside and will transport at discharge. No further TOC needs noted.           Final next level of care: OP Rehab Barriers to Discharge: Barriers Resolved   Patient Goals and CMS Choice CMS Medicare.gov Compare Post Acute Care list provided to:: Patient Choice offered to / list presented to : Patient, Spouse  Discharge Placement                  Patient to be transferred to facility by: Spouse      Discharge Plan and Services Additional resources added to the After Visit Summary for                  DME Arranged: N/A DME Agency: NA       HH Arranged: NA HH Agency: NA        Social Determinants of Health (SDOH) Interventions SDOH Screenings   Tobacco Use: Low Risk  (02/25/2022)     Readmission Risk Interventions     No data to display

## 2022-02-27 NOTE — Discharge Instructions (Addendum)
[name], you were admitted with confusion, stuttering speech and left-sided numbness which occurred after you passed out at home.  Your symptoms were thought to be due to a stroke, and you were given TNK to treat this.  However, your MRI showed that you did not have a stroke.  Your symptoms were likely caused by your brain's reaction to increased stress in your life.  You can find some more information about this on the website neurosymptoms.org  Your symptoms should get better with time.

## 2022-03-02 ENCOUNTER — Telehealth: Payer: Self-pay

## 2022-03-02 NOTE — Patient Outreach (Signed)
Received a red flag Emmi stroke notification for Alexander Carroll. I have assigned Enzo Montgomery, RN to call for follow up and determine if there are any Case Management needs.    [place] [facility], [place], [place] Management [phone]

## 2022-03-02 NOTE — Patient Outreach (Signed)
  [place] Stroke Care Coordination Follow Up  03/02/2022 Name:  Alexander Carroll MRN:  [id number] DOB:  17-Jul-1965  Subjective: Alexander Carroll is a 57 y.o. year old male who is a primary care patient of [place], [place]   An Emmi alert was received on 03/01/22 indicating patient responded to questions: Filled new prescriptions? Scheduled a follow-up appointment?.   I reached out by phone to follow up on the alert.  Care Coordination Interventions:  No, not indicated   Follow up plan:  RN CM will make another outreach attempt to patient.    Encounter Outcome:  No Answer   Enzo Montgomery, RN,BSN,CCM [place] [facility] Telephonic Care Management Coordinator Direct Phone: [phone] Toll Free: [phone] Fax: [phone]

## 2022-03-03 ENCOUNTER — Telehealth: Payer: Self-pay

## 2022-03-03 NOTE — Patient Outreach (Signed)
  [facility] Stroke Care Coordination Follow Up  03/03/2022 Name:  Maki Enslen MRN:  [id number] DOB:  01/23/1965  Subjective: Dinnie Duggin is a 57 y.o. year old male who is a primary care patient of [place], [place]   An Emmi alert was received indicating patient responded to questions: Filled new prescriptions? Scheduled a follow-up appointment?.   I reached out by phone to follow up on the alert.  Care Coordination Interventions:  No, not indicated   Follow up plan:  RN CM will make another outreach attempt to reach patient.     Encounter Outcome:  No Answer    Enzo Montgomery, RN,BSN,CCM [facility] Care Management Care Management Community Coordinator Direct Phone: [phone] Toll Free: [phone] Fax: [phone]

## 2022-03-04 ENCOUNTER — Telehealth: Payer: Self-pay

## 2022-03-04 NOTE — Patient Outreach (Addendum)
  [facility] Stroke Care Coordination Follow Up  03/04/2022 Name:  Alexander Carroll MRN:  [id number] DOB:  04-22-1965  Subjective: Alexander Carroll is a 57 y.o. year old male who is a primary care patient of [place], [place]   An Emmi alert was received on 03/01/22 indicating patient responded to questions: Filled new prescriptions? Scheduled a follow-up appointment?.    I reached out by phone to follow up on the alert.  Care Coordination Interventions:  No, not indicated   Follow up plan:  RN CM has made three unsuccessful attempts to reach patient. No further attempts will be made at this time.     Encounter Outcome:  No Answer    Enzo Montgomery, RN,BSN,CCM [facility] Care Management Care Management Community Coordinator Direct Phone: [phone] Toll Free: [phone] Fax: [phone]

## 2022-03-15 ENCOUNTER — Telehealth: Payer: Self-pay

## 2022-03-15 NOTE — Patient Outreach (Signed)
  [place] Stroke Care Coordination Follow Up  03/15/2022 Name:  Alexander Carroll MRN:  [id number] DOB:  04-07-1965  Subjective: Alexander Carroll is a 57 y.o. year old male who is a primary care patient of [place], [place]   An Emmi alert was received  on 03/13/22 indicating patient responded to questions: Scheduled a follow-up appointment? Went to follow-up appointment?. I reached out by phone to follow up on the alert.  Care Coordination Interventions:  No, not indicated   Follow up plan:  RN CM will make outreach attempt to patient again.     Encounter Outcome:  No Answer   Enzo Montgomery, RN,BSN,CCM [facility] Care Management Care Management Community Coordinator Direct Phone: [phone] Toll Free: [phone] Fax: [phone]

## 2022-03-16 ENCOUNTER — Telehealth: Payer: Self-pay

## 2022-03-16 NOTE — Patient Outreach (Signed)
  Emmi Stroke Care Coordination Follow Up  03/16/2022 Name:  Tabari Gohl MRN:  [id number] DOB:  [birth year]  Subjective: Peirce Verhelst is a 57 y.o. year old male who is a primary care patient of [place], [place]   An Emmi alert was received on 03/13/22 indicating patient responded to questions: Scheduled a follow-up appointment? Went to follow-up appointment?.   I reached out by phone to follow up on the alert.  Care Coordination Interventions:  No, not indicated   Follow up plan:  RN CM will make another outreach attempt to try to reach pt.     Encounter Outcome:  No Answer    Enzo Montgomery, RN,BSN,CCM [facility] Care Management Care Management Community Coordinator Direct Phone: [phone] Toll Free: [phone] Fax: [phone]

## 2022-03-17 ENCOUNTER — Telehealth: Payer: Self-pay

## 2022-03-17 NOTE — Patient Outreach (Signed)
  Emmi Stroke Care Coordination Follow Up  03/17/2022 Name:  Alexander Carroll MRN:  [id number] DOB:  1965/11/24  Subjective: Alexander Carroll is a 57 y.o. year old male who is a primary care patient of [place], [place]   An Emmi alert was received on 03/13/22 indicating patient responded to questions: Scheduled a follow-up appointment? Went to follow-up appointment?.   I reached out by phone to follow up on the alert.  Care Coordination Interventions:  No, not indicated   Follow up plan: No further outreach attempts will be made at this time.   Encounter Outcome:  No Answer   Enzo Montgomery, RN,BSN,CCM [facility] Care Management Care Management Community Coordinator Direct Phone: [phone] Toll Free: [phone] Fax: [phone]

## 2022-03-19 ENCOUNTER — Other Ambulatory Visit: Payer: Self-pay

## 2022-03-19 ENCOUNTER — Ambulatory Visit (HOSPITAL_COMMUNITY): Payer: 59 | Attending: Family Medicine

## 2022-03-19 ENCOUNTER — Ambulatory Visit (HOSPITAL_COMMUNITY): Payer: 59 | Admitting: Occupational Therapy

## 2022-03-19 DIAGNOSIS — R531 Weakness: Secondary | ICD-10-CM | POA: Diagnosis not present

## 2022-03-19 DIAGNOSIS — R279 Unspecified lack of coordination: Secondary | ICD-10-CM | POA: Diagnosis present

## 2022-03-19 DIAGNOSIS — R262 Difficulty in walking, not elsewhere classified: Secondary | ICD-10-CM | POA: Insufficient documentation

## 2022-03-19 DIAGNOSIS — R29898 Other symptoms and signs involving the musculoskeletal system: Secondary | ICD-10-CM | POA: Diagnosis not present

## 2022-03-19 DIAGNOSIS — R278 Other lack of coordination: Secondary | ICD-10-CM | POA: Diagnosis present

## 2022-03-19 DIAGNOSIS — I639 Cerebral infarction, unspecified: Secondary | ICD-10-CM | POA: Diagnosis not present

## 2022-03-19 DIAGNOSIS — R29818 Other symptoms and signs involving the nervous system: Secondary | ICD-10-CM | POA: Insufficient documentation

## 2022-03-19 NOTE — Therapy (Signed)
OUTPATIENT OCCUPATIONAL THERAPY NEURO EVALUATION  Patient Name: Alexander Carroll MRN: [id number] DOB:07-04-1965, 57 y.o., male Today's Date: 03/19/2022  PCP: [place] PROVIDER: Patrecia Pour, MD  END OF SESSION:   Past Medical History:  Diagnosis Date   GERD (gastroesophageal reflux disease)    Hiatal hernia    SMALL   Past Surgical History:  Procedure Laterality Date   BIOPSY  11/12/2016   Procedure: BIOPSY;  Surgeon: Danie Binder, MD;  [facility];  Service: Endoscopy;;  gastric and esophageal   COLONOSCOPY  2018   [facility]: [facility]   ESOPHAGOGASTRODUODENOSCOPY N/A 11/12/2016   Procedure: ESOPHAGOGASTRODUODENOSCOPY (EGD);  Surgeon: Danie Binder, MD;  [facility];  Service: Endoscopy;  Laterality: N/A;  830    SAVORY DILATION N/A 11/12/2016   Procedure: SAVORY DILATION;  Surgeon: Danie Binder, MD;  [facility];  Service: Endoscopy;  Laterality: N/A;   Patient Active Problem List   Diagnosis Date Noted   Colon cancer screening 11/02/2017   GERD (gastroesophageal reflux disease) 10/04/2016   Dysphagia 10/04/2016   Chest pain 07/28/2016   Nausea and vomiting 07/28/2016    ONSET DATE: 02/25/22  REFERRING DIAG: CVA  THERAPY DIAG:  No diagnosis found.  Rationale for Evaluation and Treatment: Rehabilitation  SUBJECTIVE:   SUBJECTIVE STATEMENT: "I feel back to normal" Pt accompanied by: self  PERTINENT HISTORY: Pt reports good medical history. Pt presented with speech difficulties, L sided numbness and weakness. CT showed possible R temporal hypodensity vs. Artifact.  PRECAUTIONS: None  WEIGHT BEARING RESTRICTIONS: No  PAIN:  Are you having pain? No  FALLS: Has patient fallen in last 6 months? No  LIVING ENVIRONMENT: Lives with: lives with their spouse Lives in: House/apartment  PLOF: Independent  PATIENT GOALS: To return to normal  OBJECTIVE:   HAND DOMINANCE: Right  ADLs: Overall ADLs: Pt  reports that overall he feels like he is close to normal, ADL's just take increased time. Pt having some difficulty with fine motor skills such as manipulating small items, including buttons and zippers.  MOBILITY STATUS: difficulty with turns  POSTURE COMMENTS:  No Significant postural limitations Sitting balance: Moves/returns truncal midpoint >2 inches in all planes  ACTIVITY TOLERANCE: Activity tolerance: Pt reports his tolerance is good  FUNCTIONAL OUTCOME MEASURES: FOTO: 97.92  UPPER EXTREMITY ROM:    ROM is WFL  UPPER EXTREMITY MMT:     MMT Left eval  Shoulder flexion 5/5  Shoulder abduction 5/5  Shoulder adduction 5/5  Shoulder extension 5/5  Shoulder internal rotation 4+/5  Shoulder external rotation 4+/5  Elbow flexion 4+/5  Elbow extension 4+/5  Wrist flexion 5/5  Wrist extension 5/5  Wrist ulnar deviation 5/5  Wrist radial deviation 5/5  Wrist pronation 5/5  Wrist supination 5/5  (Blank rows = not tested)  HAND FUNCTION: Grip strength: Right: 72 lbs; Left: 50 lbs, Lateral pinch: Right: 15 lbs, Left: 10 lbs, and 3 point pinch: Right: 11 lbs, Left: 7 lbs  COORDINATION: 9 Hole Peg test: Right: *** sec; Left: *** sec  SENSATION: WFL  EDEMA: No noticeable swelling  COGNITION: Overall cognitive status: Within functional limits for tasks assessed  VISION: Subjective report: "I don't have any visual concerns" Baseline vision: No visual deficits Visual history:  No Concerns  VISION ASSESSMENT: Not tested [facility]  OBSERVATIONS: ***   TODAY'S TREATMENT:  DATE: 03/19/22: Evaluation Only   PATIENT EDUCATION: Education details: *** Person educated: {Person educated:25204} Education method: {Education Method:25205} Education comprehension: {Education Comprehension:25206}  HOME EXERCISE PROGRAM: ***   GOALS: Goals reviewed with  patient? {yes/no:20286}  SHORT TERM GOALS: Target date: ***  *** Baseline: Goal status: {GOALSTATUS:25110}  2.  *** Baseline:  Goal status: {GOALSTATUS:25110}  3.  *** Baseline:  Goal status: {GOALSTATUS:25110}  4.  *** Baseline:  Goal status: {GOALSTATUS:25110}  5.  *** Baseline:  Goal status: {GOALSTATUS:25110}  6.  *** Baseline:  Goal status: {GOALSTATUS:25110}  LONG TERM GOALS: Target date: ***  *** Baseline:  Goal status: {GOALSTATUS:25110}  2.  *** Baseline:  Goal status: {GOALSTATUS:25110}  3.  *** Baseline:  Goal status: {GOALSTATUS:25110}  4.  *** Baseline:  Goal status: {GOALSTATUS:25110}  5.  *** Baseline:  Goal status: {GOALSTATUS:25110}  6.  *** Baseline:  Goal status: {GOALSTATUS:25110}  ASSESSMENT:  CLINICAL IMPRESSION: Patient is a *** y.o. *** who was seen today for occupational therapy evaluation for ***.   PERFORMANCE DEFICITS: in functional skills including {OT physical skills:25468}, cognitive skills including {OT cognitive skills:25469}, and psychosocial skills including {OT psychosocial skills:25470}.   IMPAIRMENTS: are limiting patient from {OT performance deficits:25471}.   CO-MORBIDITIES: {Comorbidities:25485} that affects occupational performance. Patient will benefit from skilled OT to address above impairments and improve overall function.  MODIFICATION OR ASSISTANCE TO COMPLETE EVALUATION: {OT modification:25474}  OT OCCUPATIONAL PROFILE AND HISTORY: {OT PROFILE AND HISTORY:25484}  CLINICAL DECISION MAKING: {OT CDM:25475}  REHAB POTENTIAL: {rehabpotential:25112}  EVALUATION COMPLEXITY: {Evaluation complexity:25115}    PLAN:  OT FREQUENCY: {rehab frequency:25116}  OT DURATION: {rehab duration:25117}  PLANNED INTERVENTIONS: {OT Interventions:25467}  RECOMMENDED OTHER SERVICES: ***  CONSULTED AND AGREED WITH PLAN OF CARE: [id number]  PLAN FOR NEXT SESSION: ***   Kashia Brossard Isabelle Course, OT 03/19/2022,  10:42 AM

## 2022-03-19 NOTE — Therapy (Signed)
OUTPATIENT PHYSICAL THERAPY NEURO EVALUATION   Patient Name: Alexander Carroll MRN: [id number] DOB:01-Jan-1966, 57 y.o., male Today's Date: 03/19/2022   PCP: [place], [place] PROVIDER: Patrecia Pour, MD  END OF SESSION:  PT End of Session - 03/19/22 1033     Visit Number 1    Number of Visits 8    Date for PT Re-Evaluation 04/16/22    Authorization Type [employer] (no auths, 30 visits)    Authorization - Visit Number 1    Authorization - Number of Visits 30    PT Start Time 0945    PT Stop Time 1030    PT Time Calculation (min) 45 min    Equipment Utilized During Treatment Gait belt    Activity Tolerance Patient tolerated treatment well    Behavior During Therapy WFL for tasks assessed/performed            Past Medical History:  Diagnosis Date   GERD (gastroesophageal reflux disease)    Hiatal hernia    SMALL   Past Surgical History:  Procedure Laterality Date   BIOPSY  11/12/2016   Procedure: BIOPSY;  Surgeon: Danie Binder, MD;  [facility];  Service: Endoscopy;;  gastric and esophageal   COLONOSCOPY  2018   [facility]   ESOPHAGOGASTRODUODENOSCOPY N/A 11/12/2016   Procedure: ESOPHAGOGASTRODUODENOSCOPY (EGD);  Surgeon: Danie Binder, MD;  [facility];  Service: Endoscopy;  Laterality: N/A;  830    SAVORY DILATION N/A 11/12/2016   Procedure: SAVORY DILATION;  Surgeon: Danie Binder, MD;  [facility];  Service: Endoscopy;  Laterality: N/A;   Patient Active Problem List   Diagnosis Date Noted   Colon cancer screening 11/02/2017   GERD (gastroesophageal reflux disease) 10/04/2016   Dysphagia 10/04/2016   Chest pain 07/28/2016   Nausea and vomiting 07/28/2016   ONSET DATE: 2-3 weeks  REFERRING DIAG: I63.9 (ICD-10-CM) - Cerebrovascular accident (CVA), unspecified mechanism ([place]) R53.1 (ICD-10-CM) - Weakness  THERAPY DIAG:  Weakness of left lower extremity  Incoordination  Difficulty  in walking, not elsewhere classified  Rationale for Evaluation and Treatment: Rehabilitation  SUBJECTIVE:                                                                                                                                                                                             SUBJECTIVE STATEMENT: Doing well. Denies any weakness, numbness/tingling or pain in the legs. Patient denies issues or difficulty with his balance, walking, or negotiating stairs. Patient feels that he doesn't need PT. However, patient states that his wife noticed something different  with the way he walks. Denies issues with his memory or swallowing. Denies weakness on the UE but claims that he has some tremors on the L hand which has started even before the incident. Condition started 2-3 weeks ago when he was getting up to get ready to work. Patient sat on the edge of the bed when he started to shake and then fell to the ground (patient passed out). Patient was immediately taken to the hospital. Clot busting med was given and was taken to another hospital. Stayed in the ICU and then MRI was done which revealed unremarkable findings. MD thinks that it could be from stress that's why the incident happened. Patient had PT in the hospital and was then D/C to outpatient PT evaluation and management. Patient stayed in the hospital for around 4 days.  Pt accompanied by: self  PERTINENT HISTORY: GERD  PAIN:  Are you having pain? No  PRECAUTIONS: None  WEIGHT BEARING RESTRICTIONS: No  FALLS: Has patient fallen in last 6 months? Yes. 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   COMPARISON:  CTA head/neck February 15, 24.   FINDINGS: Brain: No acute infarction, hemorrhage, hydrocephalus, extra-axial collection or mass lesion.   Vascular: Major arterial flow voids are maintained at the skull base.   Skull and upper cervical spine: Normal marrow signal.   Sinuses/Orbits: Minimal paranasal sinus mucosal thickening. No acute orbital findings.   Other: No sizable mastoid effusions.   IMPRESSION: No evidence of acute intracranial abnormality.  COGNITION: Overall cognitive status: Within functional limits for tasks assessed   SENSATION: WFL  COORDINATION: Intact heel-to-shin on B Impaired alternating toe taps on the floor (seated)   MUSCLE TONE: LLE: Within functional limits  MUSCLE LENGTH: Moderate tightness on B hamstrings and gastrocsoleus  POSTURE: rounded shoulders and forward head  LOWER EXTREMITY ROM:     Active  Right Eval Left Eval  Hip flexion [facility]  Hip extension    Hip abduction [facility]  Hip adduction [facility]  Hip internal rotation    Hip external rotation    Knee flexion [facility]  Knee extension [facility]  Ankle dorsiflexion [facility] WFL  Ankle plantarflexion [facility] WFL  Ankle inversion    Ankle eversion     (Blank rows = not tested)  LOWER EXTREMITY MMT:    MMT Right Eval Left Eval  Hip flexion 5 4  Hip extension    Hip abduction 5 4  Hip adduction 5   Hip internal rotation    Hip external rotation    Knee flexion 5 4+  Knee extension 5 3+  Ankle dorsiflexion 5 4  Ankle plantarflexion 5 4  Ankle inversion    Ankle eversion    (Blank rows = not tested)  TRANSFERS: Assistive device utilized: None  Sit to stand: Complete Independence Stand to sit: Complete Independence  STAIRS: Level of Assistance: Complete Independence Stair Negotiation Technique: Alternating  Pattern  with No Rails Number of Stairs: 4  Height of Stairs: 7"  Comments: little to no unsteadiness seen  GAIT: Gait pattern: decreased arm swing- Right, decreased arm swing- Left, decreased step length- Right, decreased step length- Left, decreased stride length, decreased hip/knee flexion- Right, decreased hip/knee flexion- Left, narrow BOS, poor foot clearance- Right, and poor foot clearance- Left Distance walked: 504 ft Assistive device utilized: None Level of assistance: Complete Independence Comments: done during 2MWT  FUNCTIONAL TESTS:  5 times sit to stand: 8.10 sec 2 minute walk test: 504 ft Dynamic Gait Index: 20 DGI 1. Gait level surface (3) Normal: Walks 20', no assistive devices, good sped, no evidence for imbalance, normal gait pattern 2. Change in gait speed (2) Mild Impairment: Is able to change speed but demonstrates mild gait deviations, or not gait deviations but unable to achieve a significant change in velocity, or uses an assistive device. 3. Gait with horizontal head turns (1) Moderate Impairment: Performs head turns with moderate change in gait velocity, slows down, staggers but recovers, can continue to walk. 4. Gait with vertical head turns (2) Mild Impairment: Performs head turns smoothly with slight change in gait velocity, i.e., minor disruption to smooth gait path or uses walking aid. 5. Gait and pivot turn (3) Normal: Pivot turns safely within 3 seconds and stops quickly with no loss of balance. 6. Step over obstacle (3) Normal: Is able to step over the box without changing gait speed, no evidence of imbalance. 7. Step around obstacles (3) Normal: Is able to walk around cones safely without changing gait speed; no evidence of imbalance. 8. Stairs (3) Normal: Alternating feet, no rail.  TOTAL SCORE: 20 / 24   PATIENT SURVEYS:  FOTO 97.92  TODAY'S TREATMENT:                                                                                                                               DATE:  03/19/22 Evaluation and patient education    PATIENT EDUCATION: Education details: Educated on the pathoanatomy of CVA. Educated on the goals and course of rehab. Education on PT scope of practice. Education on falls prevention at home Person educated: Patient Education method: Explanation Education comprehension: verbalized understanding  HOME EXERCISE PROGRAM: None provided to date  GOALS: Goals reviewed with patient? Yes  SHORT TERM GOALS: Target date: 04/02/22  Pt will demonstrate indep in HEP to facilitate carry-over of skilled services and improve functional outcomes Goal status: INITIAL  LONG TERM GOALS: Target date: 04/16/22  Pt will improve DGI by at least 3 points in order to demonstrate clinically significant improvement in balance and decreased risk for falls Baseline: 20 Goal status: INITIAL  2.  Pt will demonstrate increase in LE strength to 5 to facilitate ease and safety in ambulation Baseline: 3+ Goal status: INITIAL  3.  Pt will increase 2MWT by at least 40 ft in order to demonstrate clinically significant improvement in community ambulation Baseline: 504 ft Goal status: INITIAL  4.  Patient will demonstrate improved coordination as manifested by being able to  perform alt toe taps/raises correctly in 3/4 trials to facilitate improvement in ambulation Baseline: impaired Goal status: INITIAL  ASSESSMENT:  CLINICAL IMPRESSION: Patient is a 57 y.o. male who was seen today for physical therapy evaluation and treatment for CVA and weakness. Patient was diagnosed with CVA by referring provider further defined by difficulty with walking due to weakness, incoordination, and impaired proprioception. Skilled PT is required to address the impairments and functional limitations listed below.   OBJECTIVE IMPAIRMENTS: Abnormal gait, decreased balance, difficulty walking, decreased strength, and impaired flexibility.    ACTIVITY LIMITATIONS: locomotion level  PARTICIPATION LIMITATIONS: shopping and community activity  PERSONAL FACTORS:  none  are also affecting patient's functional outcome.   REHAB POTENTIAL: Excellent  CLINICAL DECISION MAKING: Stable/uncomplicated  EVALUATION COMPLEXITY: Low  PLAN:  PT FREQUENCY: 2x/week  PT DURATION: 4 weeks  PLANNED INTERVENTIONS: Therapeutic exercises, Therapeutic activity, Neuromuscular re-education, Balance training, Gait training, Patient/Family education, Self Care, Stair training, and Manual therapy  PLAN FOR NEXT SESSION: May begin LE strengthening, flexibility, balance and gait exercises. Provide HEP   Alexander Carroll, PT, DPT, OCS Board-Certified Clinical Specialist in Wing # ([place]): [id number] T 03/19/2022, 12:32 PM

## 2022-03-22 ENCOUNTER — Ambulatory Visit (HOSPITAL_COMMUNITY): Payer: 59 | Admitting: Physical Therapy

## 2022-03-22 DIAGNOSIS — R29898 Other symptoms and signs involving the musculoskeletal system: Secondary | ICD-10-CM | POA: Diagnosis not present

## 2022-03-22 DIAGNOSIS — R262 Difficulty in walking, not elsewhere classified: Secondary | ICD-10-CM

## 2022-03-22 NOTE — Therapy (Signed)
OUTPATIENT PHYSICAL THERAPY NEURO TREATMENT   Patient Name: Alexander Carroll MRN: [id number] DOB:Jan 24, 1965, 57 y.o., male Today's Date: 03/22/2022   PCP: [place], [place] REFERRING PROVIDER: Patrecia Pour, MD  END OF SESSION:  PT End of Session - 03/22/22 1648     Visit Number 2    Number of Visits 8    Date for PT Re-Evaluation 04/16/22    Authorization Type [employer] (no auths, 30 visits)    Authorization - Number of Visits 30    PT Start Time [id number]    PT Stop Time 1723    PT Time Calculation (min) 38 min    Equipment Utilized During Treatment Gait belt    Activity Tolerance Patient tolerated treatment well    Behavior During Therapy WFL for tasks assessed/performed            Past Medical History:  Diagnosis Date   GERD (gastroesophageal reflux disease)    Hiatal hernia    SMALL   Past Surgical History:  Procedure Laterality Date   BIOPSY  11/12/2016   Procedure: BIOPSY;  Surgeon: Danie Binder, MD;  [facility];  Service: Endoscopy;;  gastric and esophageal   COLONOSCOPY  2018   [facility]   ESOPHAGOGASTRODUODENOSCOPY N/A 11/12/2016   Procedure: ESOPHAGOGASTRODUODENOSCOPY (EGD);  Surgeon: Danie Binder, MD;  [facility];  Service: Endoscopy;  Laterality: N/A;  830    SAVORY DILATION N/A 11/12/2016   Procedure: SAVORY DILATION;  Surgeon: Danie Binder, MD;  [facility];  Service: Endoscopy;  Laterality: N/A;   Patient Active Problem List   Diagnosis Date Noted   Colon cancer screening 11/02/2017   GERD (gastroesophageal reflux disease) 10/04/2016   Dysphagia 10/04/2016   Chest pain 07/28/2016   Nausea and vomiting 07/28/2016   ONSET DATE: 2-3 weeks  REFERRING DIAG: I63.9 (ICD-10-CM) - Cerebrovascular accident (CVA), unspecified mechanism ([place]) R53.1 (ICD-10-CM) - Weakness  THERAPY DIAG:  Weakness of left lower extremity  Difficulty in walking, not elsewhere classified  Rationale for  Evaluation and Treatment: Rehabilitation  SUBJECTIVE:                                                                                                                                                                                             SUBJECTIVE STATEMENT: Patient sates he is doing well since Eval. Notes his hand was bothering him some at work last night. No pain today.   Eval: Doing well. Denies any weakness, numbness/tingling or pain in the legs. Patient denies issues or difficulty with his balance, walking, or negotiating stairs. Patient  feels that he doesn't need PT. However, patient states that his wife noticed something different with the way he walks. Denies issues with his memory or swallowing. Denies weakness on the UE but claims that he has some tremors on the L hand which has started even before the incident. Condition started 2-3 weeks ago when he was getting up to get ready to work. Patient sat on the edge of the bed when he started to shake and then fell to the ground (patient passed out). Patient was immediately taken to the hospital. Clot busting med was given and was taken to another hospital. Stayed in the ICU and then MRI was done which revealed unremarkable findings. MD thinks that it could be from stress that's why the incident happened. Patient had PT in the hospital and was then D/C to outpatient PT evaluation and management. Patient stayed in the hospital for around 4 days.  Pt accompanied by: self  PERTINENT HISTORY: GERD  PAIN:  Are you having pain? No  PRECAUTIONS: None  WEIGHT BEARING RESTRICTIONS: No  FALLS: Has patient fallen in last 6 months? Yes. 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   COMPARISON:  CTA head/neck February 15, 24.   FINDINGS: Brain: No acute infarction, hemorrhage, hydrocephalus, extra-axial collection or mass lesion.   Vascular: Major arterial flow voids are maintained at the skull base.   Skull and upper cervical spine: Normal marrow signal.   Sinuses/Orbits: Minimal paranasal sinus mucosal thickening. No acute orbital findings.   Other: No sizable mastoid effusions.   IMPRESSION: No evidence of acute intracranial abnormality.  COGNITION: Overall cognitive status: Within functional limits for tasks assessed   SENSATION: WFL  COORDINATION: Intact heel-to-shin on B Impaired alternating toe taps on the floor (seated)   MUSCLE TONE: LLE: Within functional limits  MUSCLE LENGTH: Moderate tightness on B hamstrings and gastrocsoleus  POSTURE: rounded shoulders and forward head  LOWER EXTREMITY ROM:     Active  Right Eval Left Eval  Hip flexion [facility]  Hip extension    Hip abduction [facility]  Hip adduction [facility]  Hip internal rotation    Hip external rotation    Knee flexion [facility]  Knee extension [facility]  Ankle dorsiflexion [facility] WFL  Ankle plantarflexion [facility] WFL  Ankle inversion    Ankle eversion     (Blank rows = not tested)  LOWER EXTREMITY MMT:    MMT Right Eval Left Eval  Hip flexion 5 4  Hip extension    Hip abduction 5 4  Hip adduction 5   Hip internal rotation    Hip external rotation    Knee flexion 5 4+  Knee extension 5 3+  Ankle dorsiflexion 5 4  Ankle plantarflexion 5 4  Ankle inversion    Ankle eversion    (Blank rows = not tested)  TRANSFERS: Assistive device utilized: None  Sit to stand: Complete Independence Stand to sit: Complete Independence  STAIRS: Level of Assistance:  Complete Independence Stair Negotiation Technique: Alternating Pattern  with No Rails Number of  Stairs: 4  Height of Stairs: 7"  Comments: little to no unsteadiness seen  GAIT: Gait pattern: decreased arm swing- Right, decreased arm swing- Left, decreased step length- Right, decreased step length- Left, decreased stride length, decreased hip/knee flexion- Right, decreased hip/knee flexion- Left, narrow BOS, poor foot clearance- Right, and poor foot clearance- Left Distance walked: 504 ft Assistive device utilized: None Level of assistance: Complete Independence Comments: done during 2MWT  FUNCTIONAL TESTS:  5 times sit to stand: 8.10 sec 2 minute walk test: 504 ft Dynamic Gait Index: 20 DGI 1. Gait level surface (3) Normal: Walks 20', no assistive devices, good sped, no evidence for imbalance, normal gait pattern 2. Change in gait speed (2) Mild Impairment: Is able to change speed but demonstrates mild gait deviations, or not gait deviations but unable to achieve a significant change in velocity, or uses an assistive device. 3. Gait with horizontal head turns (1) Moderate Impairment: Performs head turns with moderate change in gait velocity, slows down, staggers but recovers, can continue to walk. 4. Gait with vertical head turns (2) Mild Impairment: Performs head turns smoothly with slight change in gait velocity, i.e., minor disruption to smooth gait path or uses walking aid. 5. Gait and pivot turn (3) Normal: Pivot turns safely within 3 seconds and stops quickly with no loss of balance. 6. Step over obstacle (3) Normal: Is able to step over the box without changing gait speed, no evidence of imbalance. 7. Step around obstacles (3) Normal: Is able to walk around cones safely without changing gait speed; no evidence of imbalance. 8. Stairs (3) Normal: Alternating feet, no rail.  TOTAL SCORE: 20 / 24   PATIENT SURVEYS:  FOTO 97.92  TODAY'S TREATMENT:                                                                                                                               DATE:  03/22/22 Nu step 5 min (seat 10) lv 4   HR x20 TR x 20 Sit to stands x20 with no UE  Tandem stance 30 sec bilateral Step taps 6 inch x 30 no HHA Step ups 6 inch 2 x 10 each   Standing hip abduction RTB Standing hip extension RTB  Seated LAQ 5lb 3 x 10   03/19/22 Evaluation and patient education    PATIENT EDUCATION: Education details: Educated on the pathoanatomy of CVA. Educated on the goals and course of rehab. Education on PT scope of practice. Education on falls prevention at home Person educated: Patient Education method: Explanation Education comprehension: verbalized understanding  HOME EXERCISE PROGRAM: Access Code: [email or web address] URL: [email or web address] Date: 03/22/2022 Prepared by: [place] with Counter Support  - 2 x daily - 7 x weekly - 2 sets - 10 reps - Toe Raises with Counter Support  - 2 x daily - 7 x weekly - 2 sets - 10 reps - Standing Hip Abduction with Resistance  at Ankles and Counter Support  - 2 x daily - 7 x weekly - 2 sets - 10 reps - Standing Hip Extension with Resistance at Ankles and Counter Support  - 2 x daily - 7 x weekly - 2 sets - 10 reps - Standing Tandem Balance with Counter Support  - 2 x daily - 7 x weekly - 1 sets - 3 reps - 30 seconds hold - Sit to Stand Without Arm Support  - 2 x daily - 7 x weekly - 2 sets - 10 reps - Seated Long Arc Quad  - 2 x daily - 7 x weekly - 2 sets - 10 reps - 5 second hold  GOALS: Goals reviewed with patient? Yes  SHORT TERM GOALS: Target date: 04/02/22  Pt will demonstrate indep in HEP to facilitate carry-over of skilled services and improve functional outcomes Goal status: INITIAL  LONG TERM GOALS: Target date: 04/16/22  Pt will improve DGI by at least 3 points in order to demonstrate clinically significant improvement in balance and decreased risk for  falls Baseline: 20 Goal status: INITIAL  2.  Pt will demonstrate increase in LE strength to 5 to facilitate ease and safety in ambulation Baseline: 3+ Goal status: INITIAL  3.  Pt will increase 2MWT by at least 40 ft in order to demonstrate clinically significant improvement in community ambulation Baseline: 504 ft Goal status: INITIAL  4.  Patient will demonstrate improved coordination as manifested by being able to perform alt toe taps/raises correctly in 3/4 trials to facilitate improvement in ambulation Baseline: impaired Goal status: INITIAL  ASSESSMENT:  CLINICAL IMPRESSION: Initiated there ex. Patient tolerated session well today. Patient showing good return with standing static balance as well and functional strength with step ups. Patient does have notable weakness ongoing with LT knee extension. Added weighted LAQ for improved strengthening. Patient did require ongoing verbal cues to maintain straight leg during standing hip abduction and extension series. Developed HEP and issued handout. Patient will continue to benefit from skilled therapy services to reduce remaining deficits and improve functional ability.    OBJECTIVE IMPAIRMENTS: Abnormal gait, decreased balance, difficulty walking, decreased strength, and impaired flexibility.   ACTIVITY LIMITATIONS: locomotion level  PARTICIPATION LIMITATIONS: shopping and community activity  PERSONAL FACTORS:  none  are also affecting patient's functional outcome.   REHAB POTENTIAL: Excellent  CLINICAL DECISION MAKING: Stable/uncomplicated  EVALUATION COMPLEXITY: Low  PLAN:  PT FREQUENCY: 2x/week  PT DURATION: 4 weeks  PLANNED INTERVENTIONS: Therapeutic exercises, Therapeutic activity, Neuromuscular re-education, Balance training, Gait training, Patient/Family education, Self Care, Stair training, and Manual therapy  PLAN FOR NEXT SESSION: Progress LE strengthening, flexibility, balance and gait exercises.   5:22 PM,  03/22/22 Josue Hector PT DPT  Physical Therapist with [facility]  [phone]

## 2022-03-24 ENCOUNTER — Ambulatory Visit (HOSPITAL_COMMUNITY): Payer: 59 | Admitting: Physical Therapy

## 2022-03-24 DIAGNOSIS — R262 Difficulty in walking, not elsewhere classified: Secondary | ICD-10-CM

## 2022-03-24 DIAGNOSIS — R29898 Other symptoms and signs involving the musculoskeletal system: Secondary | ICD-10-CM

## 2022-03-24 NOTE — Therapy (Signed)
OUTPATIENT PHYSICAL THERAPY NEURO TREATMENT   Patient Name: Alexander Carroll MRN: [id number] DOB:01-12-1966, 57 y.o., male Today's Date: 03/24/2022   PCP: [facility] [place] REFERRING PROVIDER: Patrecia Pour, MD  END OF SESSION:  PT End of Session - 03/24/22 0948     Visit Number 3    Number of Visits 8    Date for PT Re-Evaluation 04/16/22    Authorization Type [employer] (no auths, 30 visits)    Authorization - Number of Visits 30    PT Start Time [phone]    PT Stop Time 1026    PT Time Calculation (min) 38 min    Equipment Utilized During Treatment Gait belt    Activity Tolerance Patient tolerated treatment well    Behavior During Therapy WFL for tasks assessed/performed            Past Medical History:  Diagnosis Date   GERD (gastroesophageal reflux disease)    Hiatal hernia    SMALL   Past Surgical History:  Procedure Laterality Date   BIOPSY  11/12/2016   Procedure: BIOPSY;  Surgeon: Danie Binder, MD;  [facility];  Service: Endoscopy;;  gastric and esophageal   COLONOSCOPY  2018   [facility]   ESOPHAGOGASTRODUODENOSCOPY N/A 11/12/2016   Procedure: ESOPHAGOGASTRODUODENOSCOPY (EGD);  Surgeon: Danie Binder, MD;  [facility];  Service: Endoscopy;  Laterality: N/A;  830    SAVORY DILATION N/A 11/12/2016   Procedure: SAVORY DILATION;  Surgeon: Danie Binder, MD;  [facility];  Service: Endoscopy;  Laterality: N/A;   Patient Active Problem List   Diagnosis Date Noted   Colon cancer screening 11/02/2017   GERD (gastroesophageal reflux disease) 10/04/2016   Dysphagia 10/04/2016   Chest pain 07/28/2016   Nausea and vomiting 07/28/2016   ONSET DATE: 2-3 weeks  REFERRING DIAG: I63.9 (ICD-10-CM) - Cerebrovascular accident (CVA), unspecified mechanism ([place]) R53.1 (ICD-10-CM) - Weakness  THERAPY DIAG:  Weakness of left lower extremity  Difficulty in walking, not elsewhere classified  Rationale for  Evaluation and Treatment: Rehabilitation  SUBJECTIVE:                                                                                                                                                                                             SUBJECTIVE STATEMENT: He is tired today. Just got off 3rd shift a little bit ago. No issues since last visit.   Eval: Doing well. Denies any weakness, numbness/tingling or pain in the legs. Patient denies issues or difficulty with his balance, walking, or negotiating stairs. Patient feels that he doesn't  need PT. However, patient states that his wife noticed something different with the way he walks. Denies issues with his memory or swallowing. Denies weakness on the UE but claims that he has some tremors on the L hand which has started even before the incident. Condition started 2-3 weeks ago when he was getting up to get ready to work. Patient sat on the edge of the bed when he started to shake and then fell to the ground (patient passed out). Patient was immediately taken to the hospital. Clot busting med was given and was taken to another hospital. Stayed in the ICU and then MRI was done which revealed unremarkable findings. MD thinks that it could be from stress that's why the incident happened. Patient had PT in the hospital and was then D/C to outpatient PT evaluation and management. Patient stayed in the hospital for around 4 days.  Pt accompanied by: self  PERTINENT HISTORY: GERD  PAIN:  Are you having pain? No  PRECAUTIONS: None  WEIGHT BEARING RESTRICTIONS: No  FALLS: Has patient fallen in last 6 months? Yes. 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   COMPARISON:  CTA head/neck February 15, 24.   FINDINGS: Brain: No acute infarction, hemorrhage, hydrocephalus, extra-axial collection or mass lesion.   Vascular: Major arterial flow voids are maintained at the skull base.   Skull and upper cervical spine: Normal marrow signal.   Sinuses/Orbits: Minimal paranasal sinus mucosal thickening. No acute orbital findings.   Other: No sizable mastoid effusions.   IMPRESSION: No evidence of acute intracranial abnormality.  COGNITION: Overall cognitive status: Within functional limits for tasks assessed   SENSATION: WFL  COORDINATION: Intact heel-to-shin on B Impaired alternating toe taps on the floor (seated)   MUSCLE TONE: LLE: Within functional limits  MUSCLE LENGTH: Moderate tightness on B hamstrings and gastrocsoleus  POSTURE: rounded shoulders and forward head  LOWER EXTREMITY ROM:     Active  Right Eval Left Eval  Hip flexion [facility]  Hip extension    Hip abduction [facility]  Hip adduction [facility]  Hip internal rotation    Hip external rotation    Knee flexion [facility]  Knee extension [facility]  Ankle dorsiflexion [facility] WFL  Ankle plantarflexion [facility] WFL  Ankle inversion    Ankle eversion     (Blank rows = not tested)  LOWER EXTREMITY MMT:    MMT Right Eval Left Eval  Hip flexion 5 4  Hip extension    Hip abduction 5 4  Hip adduction 5   Hip internal rotation    Hip external rotation    Knee flexion 5 4+  Knee extension 5 3+  Ankle dorsiflexion 5 4  Ankle plantarflexion 5 4  Ankle inversion    Ankle eversion    (Blank rows = not tested)  TRANSFERS: Assistive device utilized: None  Sit to stand: Complete Independence Stand to sit: Complete Independence  STAIRS: Level of Assistance: Complete  Independence Stair Negotiation Technique: Alternating Pattern  with No Rails Number of Stairs: 4  Height  of Stairs: 7"  Comments: little to no unsteadiness seen  GAIT: Gait pattern: decreased arm swing- Right, decreased arm swing- Left, decreased step length- Right, decreased step length- Left, decreased stride length, decreased hip/knee flexion- Right, decreased hip/knee flexion- Left, narrow BOS, poor foot clearance- Right, and poor foot clearance- Left Distance walked: 504 ft Assistive device utilized: None Level of assistance: Complete Independence Comments: done during 2MWT  FUNCTIONAL TESTS:  5 times sit to stand: 8.10 sec 2 minute walk test: 504 ft Dynamic Gait Index: 20 DGI 1. Gait level surface (3) Normal: Walks 20', no assistive devices, good sped, no evidence for imbalance, normal gait pattern 2. Change in gait speed (2) Mild Impairment: Is able to change speed but demonstrates mild gait deviations, or not gait deviations but unable to achieve a significant change in velocity, or uses an assistive device. 3. Gait with horizontal head turns (1) Moderate Impairment: Performs head turns with moderate change in gait velocity, slows down, staggers but recovers, can continue to walk. 4. Gait with vertical head turns (2) Mild Impairment: Performs head turns smoothly with slight change in gait velocity, i.e., minor disruption to smooth gait path or uses walking aid. 5. Gait and pivot turn (3) Normal: Pivot turns safely within 3 seconds and stops quickly with no loss of balance. 6. Step over obstacle (3) Normal: Is able to step over the box without changing gait speed, no evidence of imbalance. 7. Step around obstacles (3) Normal: Is able to walk around cones safely without changing gait speed; no evidence of imbalance. 8. Stairs (3) Normal: Alternating feet, no rail.  TOTAL SCORE: 20 / 24   PATIENT SURVEYS:  FOTO 97.92  TODAY'S TREATMENT:                                                                                                                               DATE:  03/24/22 Nu step 5 min (seat 10) lv 4   HR x20 TR x 20 Tandem stance 2 x 30 sec bilateral on foam  6 inch power ups 2 x 10 each   Chair squats 2 x 10 TKE LLE 3 plates x 20 Weighted walkouts 4 plates x 10   Weighted walkouts 3 plates lateral [id number] each   Seated LAQ 5lb 3 x 10   03/22/22 Nu step 5 min (seat 10) lv 4   HR x20 TR x 20 Sit to stands x20 with no UE  Tandem stance 30 sec bilateral Step taps 6 inch x 30 no HHA Step ups 6 inch 2 x 10 each   Standing hip abduction RTB x20 Standing hip extension RTB x20  Seated LAQ 5lb 3 x 10   03/19/22 Evaluation and patient education    PATIENT EDUCATION: Education details: Educated on the pathoanatomy of CVA. Educated on the goals and course of rehab. Education on PT scope of practice. Education on falls prevention at home Person educated: Patient Education method:  Explanation Education comprehension: verbalized understanding  HOME EXERCISE PROGRAM: Access Code: [email or web address] URL: [email or web address] Date: 03/22/2022 Prepared by: [place] with Counter Support  - 2 x daily - 7 x weekly - 2 sets - 10 reps - Toe Raises with Counter Support  - 2 x daily - 7 x weekly - 2 sets - 10 reps - Standing Hip Abduction with Resistance at Ankles and Counter Support  - 2 x daily - 7 x weekly - 2 sets - 10 reps - Standing Hip Extension with Resistance at Ankles and Counter Support  - 2 x daily - 7 x weekly - 2 sets - 10 reps - Standing Tandem Balance with Counter Support  - 2 x daily - 7 x weekly - 1 sets - 3 reps - 30 seconds hold - Sit to Stand Without Arm Support  - 2 x daily - 7 x weekly - 2 sets - 10 reps - Seated Long Arc Quad  - 2 x daily - 7 x weekly - 2 sets - 10 reps - 5 second hold  GOALS: Goals reviewed with patient? Yes  SHORT TERM GOALS: Target date: 04/02/22  Pt will demonstrate  indep in HEP to facilitate carry-over of skilled services and improve functional outcomes Goal status: INITIAL  LONG TERM GOALS: Target date: 04/16/22  Pt will improve DGI by at least 3 points in order to demonstrate clinically significant improvement in balance and decreased risk for falls Baseline: 20 Goal status: INITIAL  2.  Pt will demonstrate increase in LE strength to 5 to facilitate ease and safety in ambulation Baseline: 3+ Goal status: INITIAL  3.  Pt will increase 2MWT by at least 40 ft in order to demonstrate clinically significant improvement in community ambulation Baseline: 504 ft Goal status: INITIAL  4.  Patient will demonstrate improved coordination as manifested by being able to perform alt toe taps/raises correctly in 3/4 trials to facilitate improvement in ambulation Baseline: impaired Goal status: INITIAL  ASSESSMENT:  CLINICAL IMPRESSION: Progressed LE strength and balance activity . Patient tolerated well. Patient educated on purpose and function of all added activity. Patient did require verbal cues for pacing activity for improved muscle activity. Patient showing steady improvement in functional strength. Patient will continue to benefit from skilled therapy services to reduce remaining deficits and improve functional ability.    OBJECTIVE IMPAIRMENTS: Abnormal gait, decreased balance, difficulty walking, decreased strength, and impaired flexibility.   ACTIVITY LIMITATIONS: locomotion level  PARTICIPATION LIMITATIONS: shopping and community activity  PERSONAL FACTORS:  none  are also affecting patient's functional outcome.   REHAB POTENTIAL: Excellent  CLINICAL DECISION MAKING: Stable/uncomplicated  EVALUATION COMPLEXITY: Low  PLAN:  PT FREQUENCY: 2x/week  PT DURATION: 4 weeks  PLANNED INTERVENTIONS: Therapeutic exercises, Therapeutic activity, Neuromuscular re-education, Balance training, Gait training, Patient/Family education, Self Care,  Stair training, and Manual therapy  PLAN FOR NEXT SESSION: Progress LE strengthening, flexibility, balance and gait exercises.   10:21 AM, 03/24/22 Josue Hector PT DPT  Physical Therapist with [facility]  [phone]

## 2022-03-29 ENCOUNTER — Ambulatory Visit (HOSPITAL_COMMUNITY): Payer: 59

## 2022-04-07 ENCOUNTER — Ambulatory Visit (HOSPITAL_COMMUNITY): Payer: 59

## 2022-04-07 DIAGNOSIS — R278 Other lack of coordination: Secondary | ICD-10-CM

## 2022-04-07 DIAGNOSIS — R262 Difficulty in walking, not elsewhere classified: Secondary | ICD-10-CM

## 2022-04-07 DIAGNOSIS — R29898 Other symptoms and signs involving the musculoskeletal system: Secondary | ICD-10-CM

## 2022-04-07 NOTE — Therapy (Signed)
OUTPATIENT PHYSICAL THERAPY NEURO TREATMENT   Patient Name: Alexander Carroll MRN: [id number] DOB:1965/11/19, 57 y.o., male Today's Date: 04/07/2022   PCP: [place], [place] PROVIDER: Patrecia Pour, MD  END OF SESSION:  PT End of Session - 04/07/22 1345     Visit Number 4    Number of Visits 8    Date for PT Re-Evaluation 04/16/22    Authorization Type [employer] (no auths, 30 visits)    Authorization - Visit Number 2    Authorization - Number of Visits 30    PT Start Time 1346    PT Stop Time 1426    PT Time Calculation (min) 40 min    Equipment Utilized During Treatment Gait belt    Activity Tolerance Patient tolerated treatment well    Behavior During Therapy WFL for tasks assessed/performed            Past Medical History:  Diagnosis Date   GERD (gastroesophageal reflux disease)    Hiatal hernia    SMALL   Past Surgical History:  Procedure Laterality Date   BIOPSY  11/12/2016   Procedure: BIOPSY;  Surgeon: Danie Binder, MD;  [facility]: [facility];  Service: Endoscopy;;  gastric and esophageal   COLONOSCOPY  2018   [facility]: [facility]   ESOPHAGOGASTRODUODENOSCOPY N/A 11/12/2016   Procedure: ESOPHAGOGASTRODUODENOSCOPY (EGD);  Surgeon: Danie Binder, MD;  Location: [facility];  Service: Endoscopy;  Laterality: N/A;  830    SAVORY DILATION N/A 11/12/2016   Procedure: SAVORY DILATION;  Surgeon: Danie Binder, MD;  Location: [facility];  Service: Endoscopy;  Laterality: N/A;   Patient Active Problem List   Diagnosis Date Noted   Colon cancer screening 11/02/2017   GERD (gastroesophageal reflux disease) 10/04/2016   Dysphagia 10/04/2016   Chest pain 07/28/2016   Nausea and vomiting 07/28/2016   ONSET DATE: 2-3 weeks  REFERRING DIAG: I63.9 (ICD-10-CM) - Cerebrovascular accident (CVA), unspecified mechanism ([place]) R53.1 (ICD-10-CM) - Weakness  THERAPY DIAG:  Weakness of left lower extremity  Difficulty in walking, not  elsewhere classified  Other lack of coordination  Rationale for Evaluation and Treatment: Rehabilitation  SUBJECTIVE:                                                                                                                                                                                             SUBJECTIVE STATEMENT: Having a little trouble with his left wrist sometimes at work; he has it taped at work at that helps; declines OT at this time.     Eval: Doing well. Denies any weakness, numbness/tingling  or pain in the legs. Patient denies issues or difficulty with his balance, walking, or negotiating stairs. Patient feels that he doesn't need PT. However, patient states that his wife noticed something different with the way he walks. Denies issues with his memory or swallowing. Denies weakness on the UE but claims that he has some tremors on the L hand which has started even before the incident. Condition started 2-3 weeks ago when he was getting up to get ready to work. Patient sat on the edge of the bed when he started to shake and then fell to the ground (patient passed out). Patient was immediately taken to the hospital. Clot busting med was given and was taken to another hospital. Stayed in the ICU and then MRI was done which revealed unremarkable findings. MD thinks that it could be from stress that's why the incident happened. Patient had PT in the hospital and was then D/C to outpatient PT evaluation and management. Patient stayed in the hospital for around 4 days.  Pt accompanied by: self  PERTINENT HISTORY: GERD  PAIN:  Are you having pain? No  PRECAUTIONS: None  WEIGHT BEARING RESTRICTIONS: No  FALLS: Has patient fallen in last 6 months? Yes. 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   COMPARISON:  CTA head/neck February 15, 24.   FINDINGS: Brain: No acute infarction, hemorrhage, hydrocephalus, extra-axial collection or mass lesion.   Vascular: Major arterial flow voids are maintained at the skull base.   Skull and upper cervical spine: Normal marrow signal.   Sinuses/Orbits: Minimal paranasal sinus mucosal thickening. No acute orbital findings.   Other: No sizable mastoid effusions.   IMPRESSION: No evidence of acute intracranial abnormality.  COGNITION: Overall cognitive status: Within functional limits for tasks assessed   SENSATION: WFL  COORDINATION: Intact heel-to-shin on B Impaired alternating toe taps on the floor (seated)   MUSCLE TONE: LLE: Within functional limits  MUSCLE LENGTH: Moderate tightness on B hamstrings and gastrocsoleus  POSTURE: rounded shoulders and forward head  LOWER EXTREMITY ROM:     Active  Right Eval Left Eval  Hip flexion [facility]  Hip extension    Hip abduction [facility]  Hip adduction [facility]  Hip internal rotation    Hip external rotation    Knee flexion [facility]  Knee extension [facility]  Ankle dorsiflexion [facility] WFL  Ankle plantarflexion [facility] WFL  Ankle inversion    Ankle eversion     (Blank rows = not tested)  LOWER EXTREMITY MMT:    MMT Right Eval Left Eval  Hip flexion 5 4  Hip extension    Hip abduction 5 4  Hip adduction 5   Hip internal rotation    Hip external rotation    Knee flexion 5 4+  Knee extension 5 3+  Ankle dorsiflexion 5 4  Ankle plantarflexion 5 4  Ankle inversion    Ankle eversion    (Blank rows = not tested)  TRANSFERS: Assistive device utilized: None  Sit to stand:  Complete Independence Stand to sit: Complete Independence  STAIRS: Level of Assistance: Complete Independence Stair Negotiation Technique: Alternating Pattern  with No Rails Number of Stairs: 4  Height of Stairs: 7"  Comments: little to no unsteadiness seen  GAIT: Gait pattern: decreased arm swing- Right, decreased arm swing- Left, decreased step length- Right, decreased step length- Left, decreased stride length, decreased hip/knee flexion- Right, decreased hip/knee flexion- Left, narrow BOS, poor foot clearance- Right, and poor foot clearance- Left Distance walked: 504 ft Assistive device utilized: None Level of assistance: Complete Independence Comments: done during 2MWT  FUNCTIONAL TESTS:  5 times sit to stand: 8.10 sec 2 minute walk test: 504 ft Dynamic Gait Index: 20 DGI 1. Gait level surface (3) Normal: Walks 20', no assistive devices, good sped, no evidence for imbalance, normal gait pattern 2. Change in gait speed (2) Mild Impairment: Is able to change speed but demonstrates mild gait deviations, or not gait deviations but unable to achieve a significant change in velocity, or uses an assistive device. 3. Gait with horizontal head turns (1) Moderate Impairment: Performs head turns with moderate change in gait velocity, slows down, staggers but recovers, can continue to walk. 4. Gait with vertical head turns (2) Mild Impairment: Performs head turns smoothly with slight change in gait velocity, i.e., minor disruption to smooth gait path or uses walking aid. 5. Gait and pivot turn (3) Normal: Pivot turns safely within 3 seconds and stops quickly with no loss of balance. 6. Step over obstacle (3) Normal: Is able to step over the box without changing gait speed, no evidence of imbalance. 7. Step around obstacles (3) Normal: Is able to walk around cones safely without changing gait speed; no evidence of imbalance. 8. Stairs (3) Normal: Alternating feet, no rail.  TOTAL  SCORE: 20 / 24   PATIENT SURVEYS:  FOTO 97.92  TODAY'S TREATMENT:                                                                                                                              DATE:  04/07/22 Nu step 5 min (seat 10) lv 4   Standing: Heel/toe raises on incline x 20 Squats to chair target 2 x 10 8" box step ups no UE assist 2 x 10 8" box lateral step ups 2 x 10 bilateral UE assist Hip vectors 2" hold x 10 each  Sitting: 5# LAQ's with 2" hold 2 x 10  [employer] leg press 6 plates 2 x 10   [id number] Nu step 5 min (seat 10) lv 4   HR x20 TR x 20 Tandem stance 2 x 30 sec bilateral on foam  6 inch power ups 2 x 10 each   Chair squats 2 x 10 TKE LLE 3 plates x 20 Weighted walkouts 4 plates x 10   Weighted walkouts 3 plates lateral [id number] each   Seated LAQ 5lb 3 x 10   03/22/22 Nu step 5 min (seat 10) lv 4   HR x20 TR  x 20 Sit to stands x20 with no UE  Tandem stance 30 sec bilateral Step taps 6 inch x 30 no HHA Step ups 6 inch 2 x 10 each   Standing hip abduction RTB x20 Standing hip extension RTB x20  Seated LAQ 5lb 3 x 10   03/19/22 Evaluation and patient education    PATIENT EDUCATION: Education details: Educated on the pathoanatomy of CVA. Educated on the goals and course of rehab. Education on PT scope of practice. Education on falls prevention at home Person educated: Patient Education method: Explanation Education comprehension: verbalized understanding  HOME EXERCISE PROGRAM: Access Code: [email or web address] URL: [email or web address] Date: 03/22/2022 Prepared by: [place] with Counter Support  - 2 x daily - 7 x weekly - 2 sets - 10 reps - Toe Raises with Counter Support  - 2 x daily - 7 x weekly - 2 sets - 10 reps - Standing Hip Abduction with Resistance at Ankles and Counter Support  - 2 x daily - 7 x weekly - 2 sets - 10 reps - Standing Hip Extension with Resistance at Ankles and Counter Support  - 2 x  daily - 7 x weekly - 2 sets - 10 reps - Standing Tandem Balance with Counter Support  - 2 x daily - 7 x weekly - 1 sets - 3 reps - 30 seconds hold - Sit to Stand Without Arm Support  - 2 x daily - 7 x weekly - 2 sets - 10 reps - Seated Long Arc Quad  - 2 x daily - 7 x weekly - 2 sets - 10 reps - 5 second hold  GOALS: Goals reviewed with patient? Yes  SHORT TERM GOALS: Target date: 04/02/22  Pt will demonstrate indep in HEP to facilitate carry-over of skilled services and improve functional outcomes Goal status: INITIAL  LONG TERM GOALS: Target date: 04/16/22  Pt will improve DGI by at least 3 points in order to demonstrate clinically significant improvement in balance and decreased risk for falls Baseline: 20 Goal status: INITIAL  2.  Pt will demonstrate increase in LE strength to 5 to facilitate ease and safety in ambulation Baseline: 3+ Goal status: INITIAL  3.  Pt will increase 2MWT by at least 40 ft in order to demonstrate clinically significant improvement in community ambulation Baseline: 504 ft Goal status: INITIAL  4.  Patient will demonstrate improved coordination as manifested by being able to perform alt toe taps/raises correctly in 3/4 trials to facilitate improvement in ambulation Baseline: impaired Goal status: INITIAL  ASSESSMENT:  CLINICAL IMPRESSION: Progressed LE strength and balance activity.  Patient occasionally impulsive with activity and needs cues for pacing and technique.  Added leg press today with good challenge.   Patient will continue to benefit from skilled therapy services to reduce remaining deficits and improve functional ability.    OBJECTIVE IMPAIRMENTS: Abnormal gait, decreased balance, difficulty walking, decreased strength, and impaired flexibility.   ACTIVITY LIMITATIONS: locomotion level  PARTICIPATION LIMITATIONS: shopping and community activity  PERSONAL FACTORS:  none  are also affecting patient's functional outcome.   REHAB  POTENTIAL: Excellent  CLINICAL DECISION MAKING: Stable/uncomplicated  EVALUATION COMPLEXITY: Low  PLAN:  PT FREQUENCY: 2x/week  PT DURATION: 4 weeks  PLANNED INTERVENTIONS: Therapeutic exercises, Therapeutic activity, Neuromuscular re-education, Balance training, Gait training, Patient/Family education, Self Care, Stair training, and Manual therapy  PLAN FOR NEXT SESSION: Progress LE strengthening, flexibility, balance and gait exercises.   2:33 PM, 04/07/22 Zeeva Courser Small Enrique Weiss MPT  [facility] [place] [phone]

## 2022-04-12 ENCOUNTER — Ambulatory Visit (HOSPITAL_COMMUNITY): Payer: 59 | Attending: Family Medicine | Admitting: Physical Therapy

## 2022-04-12 DIAGNOSIS — R29898 Other symptoms and signs involving the musculoskeletal system: Secondary | ICD-10-CM | POA: Insufficient documentation

## 2022-04-12 DIAGNOSIS — R262 Difficulty in walking, not elsewhere classified: Secondary | ICD-10-CM | POA: Diagnosis present

## 2022-04-12 NOTE — Therapy (Signed)
OUTPATIENT PHYSICAL THERAPY NEURO TREATMENT   Patient Name: Alexander Carroll MRN: [id number] DOB:21-Dec-1965, 57 y.o., male Today's Date: 04/12/2022   PCP: [place], [place] PROVIDER: Patrecia Pour, MD  END OF SESSION:  PT End of Session - 04/12/22 1025     Visit Number 5    Number of Visits 8    Date for PT Re-Evaluation 04/16/22    Authorization Type [employer] (no auths, 30 visits)    Authorization - Number of Visits 30    PT Start Time 1031    PT Stop Time 1111    PT Time Calculation (min) 40 min    Equipment Utilized During Treatment Gait belt    Activity Tolerance Patient tolerated treatment well    Behavior During Therapy WFL for tasks assessed/performed            Past Medical History:  Diagnosis Date   GERD (gastroesophageal reflux disease)    Hiatal hernia    SMALL   Past Surgical History:  Procedure Laterality Date   BIOPSY  11/12/2016   Procedure: BIOPSY;  Surgeon: Danie Binder, MD;  [facility]: [facility];  Service: Endoscopy;;  gastric and esophageal   COLONOSCOPY  2018   [facility]: [facility]   ESOPHAGOGASTRODUODENOSCOPY N/A 11/12/2016   Procedure: ESOPHAGOGASTRODUODENOSCOPY (EGD);  Surgeon: Danie Binder, MD;  Location: [facility];  Service: Endoscopy;  Laterality: N/A;  830    SAVORY DILATION N/A 11/12/2016   Procedure: SAVORY DILATION;  Surgeon: Danie Binder, MD;  Location: [facility];  Service: Endoscopy;  Laterality: N/A;   Patient Active Problem List   Diagnosis Date Noted   Colon cancer screening 11/02/2017   GERD (gastroesophageal reflux disease) 10/04/2016   Dysphagia 10/04/2016   Chest pain 07/28/2016   Nausea and vomiting 07/28/2016   ONSET DATE: 2-3 weeks  REFERRING DIAG: I63.9 (ICD-10-CM) - Cerebrovascular accident (CVA), unspecified mechanism ([place]) R53.1 (ICD-10-CM) - Weakness  THERAPY DIAG:  Weakness of left lower extremity  Difficulty in walking, not elsewhere classified  Rationale for  Evaluation and Treatment: Rehabilitation  SUBJECTIVE:                                                                                                                                                                                             SUBJECTIVE STATEMENT: Patient says he is doing very well. Has not been having much issues with function lately. No pain right now.     Eval: Doing well. Denies any weakness, numbness/tingling or pain in the legs. Patient denies issues or difficulty with his balance, walking, or negotiating stairs. Patient  feels that he doesn't need PT. However, patient states that his wife noticed something different with the way he walks. Denies issues with his memory or swallowing. Denies weakness on the UE but claims that he has some tremors on the L hand which has started even before the incident. Condition started 2-3 weeks ago when he was getting up to get ready to work. Patient sat on the edge of the bed when he started to shake and then fell to the ground (patient passed out). Patient was immediately taken to the hospital. Clot busting med was given and was taken to another hospital. Stayed in the ICU and then MRI was done which revealed unremarkable findings. MD thinks that it could be from stress that's why the incident happened. Patient had PT in the hospital and was then D/C to outpatient PT evaluation and management. Patient stayed in the hospital for around 4 days.  Pt accompanied by: self  PERTINENT HISTORY: GERD  PAIN:  Are you having pain? No  PRECAUTIONS: None  WEIGHT BEARING RESTRICTIONS: No  FALLS: Has patient fallen in last 6 months? Yes. 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   COMPARISON:  CTA head/neck February 15, 24.   FINDINGS: Brain: No acute infarction, hemorrhage, hydrocephalus, extra-axial collection or mass lesion.   Vascular: Major arterial flow voids are maintained at the skull base.   Skull and upper cervical spine: Normal marrow signal.   Sinuses/Orbits: Minimal paranasal sinus mucosal thickening. No acute orbital findings.   Other: No sizable mastoid effusions.   IMPRESSION: No evidence of acute intracranial abnormality.  COGNITION: Overall cognitive status: Within functional limits for tasks assessed   SENSATION: WFL  COORDINATION: Intact heel-to-shin on B Impaired alternating toe taps on the floor (seated)   MUSCLE TONE: LLE: Within functional limits  MUSCLE LENGTH: Moderate tightness on B hamstrings and gastrocsoleus  POSTURE: rounded shoulders and forward head  LOWER EXTREMITY ROM:     Active  Right Eval Left Eval  Hip flexion [facility]  Hip extension    Hip abduction [facility]  Hip adduction [facility]  Hip internal rotation    Hip external rotation    Knee flexion [facility]  Knee extension [facility]  Ankle dorsiflexion [facility] WFL  Ankle plantarflexion [facility] WFL  Ankle inversion    Ankle eversion     (Blank rows = not tested)  LOWER EXTREMITY MMT:    MMT Right Eval Left Eval  Hip flexion 5 4  Hip extension    Hip abduction 5 4  Hip adduction 5   Hip internal rotation    Hip external rotation    Knee flexion 5 4+  Knee extension 5 3+  Ankle dorsiflexion 5 4  Ankle plantarflexion 5 4  Ankle inversion    Ankle eversion    (Blank rows = not tested)  TRANSFERS: Assistive device utilized: None  Sit to stand: Complete Independence Stand to sit: Complete Independence  STAIRS: Level of Assistance:  Complete Independence Stair Negotiation Technique: Alternating Pattern  with No Rails Number of  Stairs: 4  Height of Stairs: 7"  Comments: little to no unsteadiness seen  GAIT: Gait pattern: decreased arm swing- Right, decreased arm swing- Left, decreased step length- Right, decreased step length- Left, decreased stride length, decreased hip/knee flexion- Right, decreased hip/knee flexion- Left, narrow BOS, poor foot clearance- Right, and poor foot clearance- Left Distance walked: 504 ft Assistive device utilized: None Level of assistance: Complete Independence Comments: done during 2MWT  FUNCTIONAL TESTS:  5 times sit to stand: 8.10 sec 2 minute walk test: 504 ft Dynamic Gait Index: 20 DGI 1. Gait level surface (3) Normal: Walks 20', no assistive devices, good sped, no evidence for imbalance, normal gait pattern 2. Change in gait speed (2) Mild Impairment: Is able to change speed but demonstrates mild gait deviations, or not gait deviations but unable to achieve a significant change in velocity, or uses an assistive device. 3. Gait with horizontal head turns (1) Moderate Impairment: Performs head turns with moderate change in gait velocity, slows down, staggers but recovers, can continue to walk. 4. Gait with vertical head turns (2) Mild Impairment: Performs head turns smoothly with slight change in gait velocity, i.e., minor disruption to smooth gait path or uses walking aid. 5. Gait and pivot turn (3) Normal: Pivot turns safely within 3 seconds and stops quickly with no loss of balance. 6. Step over obstacle (3) Normal: Is able to step over the box without changing gait speed, no evidence of imbalance. 7. Step around obstacles (3) Normal: Is able to walk around cones safely without changing gait speed; no evidence of imbalance. 8. Stairs (3) Normal: Alternating feet, no rail.  TOTAL SCORE: 20 / 24   PATIENT SURVEYS:  FOTO 97.92  TODAY'S TREATMENT:                                                                                                                               DATE:  04/12/22 Nu step 5 min (seat 10) lv 5   Standing: Heel/toe raises on incline x 20 Squats to chair target 2 x 10 with Gtb at knees Standing hip extension GTB 2 x 10  8" box taps x 20  8" box power ups 2 x 10    GTB sidestepping 6 RT  Hip vectors 2" hold x 10 each  Sitting: [employer] leg press 6 plates 3 x 10  [id number] Nu step 5 min (seat 10) lv 4   Standing: Heel/toe raises on incline x 20 Squats to chair target 2 x 10 8" box step ups no UE assist 2 x 10 8" box lateral step ups 2 x 10 bilateral UE assist Hip vectors 2" hold x 10 each  Sitting: 5# LAQ's with 2" hold 2 x 10  [employer] leg press 6 plates 2 x 10   [id number] Nu step 5 min (seat 10) lv 4   HR x20 TR x 20 Tandem stance 2 x 30 sec bilateral on foam  6  inch power ups 2 x 10 each   Chair squats 2 x 10 TKE LLE 3 plates x 20 Weighted walkouts 4 plates x 10   Weighted walkouts 3 plates lateral [id number] each   Seated LAQ 5lb 3 x 10    PATIENT EDUCATION: Education details: Educated on the pathoanatomy of CVA. Educated on the goals and course of rehab. Education on PT scope of practice. Education on falls prevention at home Person educated: Patient Education method: Explanation Education comprehension: verbalized understanding  HOME EXERCISE PROGRAM: Access Code: [email or web address] URL: [email or web address] Date: 03/22/2022 Prepared by: [place] with Counter Support  - 2 x daily - 7 x weekly - 2 sets - 10 reps - Toe Raises with Counter Support  - 2 x daily - 7 x weekly - 2 sets - 10 reps - Standing Hip Abduction with Resistance at Ankles and Counter Support  - 2 x daily - 7 x weekly - 2 sets - 10 reps - Standing Hip Extension with Resistance at Ankles and Counter Support  - 2 x daily - 7 x weekly - 2 sets - 10 reps - Standing Tandem Balance with Counter Support  - 2 x  daily - 7 x weekly - 1 sets - 3 reps - 30 seconds hold - Sit to Stand Without Arm Support  - 2 x daily - 7 x weekly - 2 sets - 10 reps - Seated Long Arc Quad  - 2 x daily - 7 x weekly - 2 sets - 10 reps - 5 second hold  GOALS: Goals reviewed with patient? Yes  SHORT TERM GOALS: Target date: 04/02/22  Pt will demonstrate indep in HEP to facilitate carry-over of skilled services and improve functional outcomes Goal status: INITIAL  LONG TERM GOALS: Target date: 04/16/22  Pt will improve DGI by at least 3 points in order to demonstrate clinically significant improvement in balance and decreased risk for falls Baseline: 20 Goal status: INITIAL  2.  Pt will demonstrate increase in LE strength to 5 to facilitate ease and safety in ambulation Baseline: 3+ Goal status: INITIAL  3.  Pt will increase 2MWT by at least 40 ft in order to demonstrate clinically significant improvement in community ambulation Baseline: 504 ft Goal status: INITIAL  4.  Patient will demonstrate improved coordination as manifested by being able to perform alt toe taps/raises correctly in 3/4 trials to facilitate improvement in ambulation Baseline: impaired Goal status: INITIAL  ASSESSMENT:  CLINICAL IMPRESSION: Patient tolerated session well today. Continues to be challenged with static balance tasks, especially on compliant surfaces. Patient showing good improvement in strength and able to tolerate increased resistance band with sidestepping and hip extension. Patient required verbal cues to maintain leg extension during standing hip extension. Patient will continue to benefit from skilled therapy services to reduce remaining deficits and improve functional ability.    OBJECTIVE IMPAIRMENTS: Abnormal gait, decreased balance, difficulty walking, decreased strength, and impaired flexibility.   ACTIVITY LIMITATIONS: locomotion level  PARTICIPATION LIMITATIONS: shopping and community activity  PERSONAL FACTORS:   none  are also affecting patient's functional outcome.   REHAB POTENTIAL: Excellent  CLINICAL DECISION MAKING: Stable/uncomplicated  EVALUATION COMPLEXITY: Low  PLAN:  PT FREQUENCY: 2x/week  PT DURATION: 4 weeks  PLANNED INTERVENTIONS: Therapeutic exercises, Therapeutic activity, Neuromuscular re-education, Balance training, Gait training, Patient/Family education, Self Care, Stair training, and Manual therapy  PLAN FOR NEXT SESSION: Progress LE strengthening, flexibility, balance and gait exercises.  11:11 AM, 04/12/22 Lysbeth Galas  Gennie Eisinger PT DPT  Physical Therapist with [facility]  [phone]

## 2022-04-14 ENCOUNTER — Ambulatory Visit (HOSPITAL_COMMUNITY): Payer: 59

## 2022-04-20 ENCOUNTER — Ambulatory Visit (HOSPITAL_COMMUNITY): Payer: 59 | Admitting: Physical Therapy

## 2022-04-20 DIAGNOSIS — R262 Difficulty in walking, not elsewhere classified: Secondary | ICD-10-CM

## 2022-04-20 DIAGNOSIS — R29898 Other symptoms and signs involving the musculoskeletal system: Secondary | ICD-10-CM | POA: Diagnosis not present

## 2022-04-20 NOTE — Therapy (Signed)
OUTPATIENT PHYSICAL THERAPY NEURO TREATMENT   Patient Name: Alexander Carroll MRN: [id number] DOB:Jun 26, 1965, 57 y.o., male Today's Date: 04/20/2022 PHYSICAL THERAPY DISCHARGE SUMMARY  Visits from Start of Care: 6  Current functional level related to goals / functional outcomes: See below   Remaining deficits: See below   Education / Equipment: See assessment    Patient agrees to discharge. Patient goals were met. Patient is being discharged due to meeting the stated rehab goals.   PCP: Nathen MayPllc, [facility] REFERRING PROVIDER: Tyrone NineGrunz, Ryan B, MD  END OF SESSION:  PT End of Session - 04/20/22 1029     Visit Number 6    Number of Visits 8    Date for PT Re-Evaluation 04/20/22    Authorization Type [employer] (no auths, 30 visits)    Authorization - Number of Visits 30    PT Start Time 1030    PT Stop Time 1108    PT Time Calculation (min) 38 min    Equipment Utilized During Treatment Gait belt    Activity Tolerance Patient tolerated treatment well    Behavior During Therapy WFL for tasks assessed/performed            Past Medical History:  Diagnosis Date   GERD (gastroesophageal reflux disease)    Hiatal hernia    SMALL   Past Surgical History:  Procedure Laterality Date   BIOPSY  11/12/2016   Procedure: BIOPSY;  Surgeon: West BaliFields, Sandi L, MD;  [facility];  Service: Endoscopy;;  gastric and esophageal   COLONOSCOPY  2018   [facility]: [facility]   ESOPHAGOGASTRODUODENOSCOPY N/A 11/12/2016   Procedure: ESOPHAGOGASTRODUODENOSCOPY (EGD);  Surgeon: West BaliFields, Sandi L, MD;  Location: [facility];  Service: Endoscopy;  Laterality: N/A;  830    SAVORY DILATION N/A 11/12/2016   Procedure: SAVORY DILATION;  Surgeon: West BaliFields, Sandi L, MD;  Location: [facility];  Service: Endoscopy;  Laterality: N/A;   Patient Active Problem List   Diagnosis Date Noted   Colon cancer screening 11/02/2017   GERD (gastroesophageal reflux disease) 10/04/2016    Dysphagia 10/04/2016   Chest pain 07/28/2016   Nausea and vomiting 07/28/2016   ONSET DATE: 2-3 weeks  REFERRING DIAG: I63.9 (ICD-10-CM) - Cerebrovascular accident (CVA), unspecified mechanism (HCC) R53.1 (ICD-10-CM) - Weakness  THERAPY DIAG:  Difficulty in walking, not elsewhere classified - Plan: PT plan of care cert/re-cert  Rationale for Evaluation and Treatment: Rehabilitation  SUBJECTIVE:  SUBJECTIVE STATEMENT: Patient reports continued improvement at work and at home. No new issues. Feeling good with balance and exercises.    Eval: Doing well. Denies any weakness, numbness/tingling or pain in the legs. Patient denies issues or difficulty with his balance, walking, or negotiating stairs. Patient feels that he doesn't need PT. However, patient states that his wife noticed something different with the way he walks. Denies issues with his memory or swallowing. Denies weakness on the UE but claims that he has some tremors on the L hand which has started even before the incident. Condition started 2-3 weeks ago when he was getting up to get ready to work. Patient sat on the edge of the bed when he started to shake and then fell to the ground (patient passed out). Patient was immediately taken to the hospital. Clot busting med was given and was taken to another hospital. Stayed in the ICU and then MRI was done which revealed unremarkable findings. MD thinks that it could be from stress that's why the incident happened. Patient had PT in the hospital and was then D/C to outpatient PT evaluation and management. Patient stayed in the hospital for around 4 days.  Pt accompanied by: self  PERTINENT HISTORY: GERD  PAIN:  Are you having pain? No  PRECAUTIONS: None  WEIGHT BEARING RESTRICTIONS: No  FALLS: Has  patient fallen in last 6 months? Yes. 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   COMPARISON:  CTA head/neck February 15, 24.   FINDINGS: Brain: No acute infarction, hemorrhage, hydrocephalus, extra-axial collection or mass lesion.   Vascular: Major arterial flow voids are maintained at the skull base.   Skull and upper cervical spine: Normal marrow signal.   Sinuses/Orbits: Minimal paranasal sinus mucosal thickening. No acute orbital findings.   Other: No sizable mastoid effusions.   IMPRESSION: No evidence of acute intracranial abnormality.  COGNITION: Overall cognitive status: Within functional limits for tasks assessed   SENSATION: WFL  COORDINATION: Intact heel-to-shin on B Impaired alternating toe taps on the floor (seated)   MUSCLE TONE: LLE: Within functional limits  MUSCLE LENGTH: Moderate tightness on B hamstrings and gastrocsoleus  POSTURE: rounded shoulders and forward head  LOWER EXTREMITY ROM:     Active  Right Eval Left Eval  Hip flexion [facility]  Hip extension    Hip abduction [facility]  Hip adduction [facility]  Hip internal rotation    Hip external rotation    Knee flexion [facility]  Knee extension [facility]  Ankle dorsiflexion [facility]  Ankle plantarflexion [facility] WFL  Ankle inversion    Ankle eversion     (Blank rows = not tested)  LOWER EXTREMITY MMT:    MMT Right Eval Left Eval Right 04/20/22 Left 04/20/22  Hip flexion 5 4 5 5   Hip extension   4+ 4+  Hip abduction  5 4 4+ 4+  Hip adduction 5     Hip internal rotation      Hip external rotation      Knee flexion 5 4+ 5 4+  Knee extension 5 3+ 4+ 4+  Ankle dorsiflexion 5 4 5 5   Ankle plantarflexion 5 4    Ankle inversion      Ankle eversion      (Blank rows = not tested)  TRANSFERS: Assistive device utilized: None  Sit to stand: Complete Independence Stand to sit: Complete Independence  STAIRS: Level of Assistance: Complete Independence Stair Negotiation Technique: Alternating Pattern  with No Rails Number of Stairs: 4  Height of Stairs: 7"  Comments: little to no unsteadiness seen  GAIT: Gait pattern: decreased arm swing- Right, decreased arm swing- Left, decreased step length- Right, decreased step length- Left, decreased stride length, decreased hip/knee flexion- Right, decreased hip/knee flexion- Left, narrow BOS, poor foot clearance- Right, and poor foot clearance- Left Distance walked: 504 ft Assistive device utilized: None Level of assistance: Complete Independence Comments: done during  FUNCTIONAL TESTS:  5 times sit to stand: 9 seconds no UE (was 8.10 sec) 2 minute walk test: 678 feet no AD (was 504 ft) Dynamic Gait Index: 23 (was 20)    DGI 1. Gait level surface (3) Normal: Walks 20', no assistive devices, good sped, no evidence for imbalance, normal gait pattern 2. Change in gait speed (3) Normal: Able to smoothly change walking speed without loss of balance or gait deviation. Shows a significant difference in walking speeds between normal, fast and slow speeds. 3. Gait with horizontal head turns (3) Normal: Performs head turns smoothly with no change in gait. 4. Gait with vertical head turns (2) Mild Impairment: Performs head turns smoothly with slight change in gait velocity, i.e., minor disruption to smooth gait path or uses walking aid. 5. Gait and pivot turn (3) Normal: Pivot turns safely within 3 seconds and stops quickly with no loss of balance. 6. Step over  obstacle (3) Normal: Is able to step over the box without changing gait speed, no evidence of imbalance. 7. Step around obstacles (3) Normal: Is able to walk around cones safely without changing gait speed; no evidence of imbalance. 8. Stairs (3) Normal: Alternating feet, no rail.  TOTAL SCORE: 23 / 24   PATIENT SURVEYS:  FOTO 97.92  TODAY'S TREATMENT:                                                                                                                              DATE:  04/20/22 Reassess MMT 5 x STS 2 MWT DGI  HEP review   04/12/22 Nu step 5 min (seat 10) lv 5   Standing: Heel/toe raises on incline x 20 Squats to chair target 2 x 10 with Gtb at knees Standing hip extension GTB 2 x 10  8" box taps x 20  8" box power ups 2 x 10    GTB sidestepping 6 RT  Hip vectors 2" hold x 10 each  Sitting: [employer] leg press 6 plates 3 x 10  4/78/29 Nu step 5 min (seat 10) lv 4  Standing: Heel/toe raises on incline x 20 Squats to chair target 2 x 10 8" box step ups no UE assist 2 x 10 8" box lateral step ups 2 x 10 bilateral UE assist Hip vectors 2" hold x 10 each  Sitting: 5# LAQ's with 2" hold 2 x 10  [employer] leg press 6 plates 2 x 10   PATIENT EDUCATION: Education details: Educated on the pathoanatomy of CVA. Educated on the goals and course of rehab. Education on PT scope of practice. Education on falls prevention at home Person educated: Patient Education method: Explanation Education comprehension: verbalized understanding  HOME EXERCISE PROGRAM: Access Code: [email or web address] URL: [email or web address] Date: 03/22/2022 Prepared by: Georges Lynch  Exercises - Heel Raises with Counter Support  - 2 x daily - 7 x weekly - 2 sets - 10 reps - Toe Raises with Counter Support  - 2 x daily - 7 x weekly - 2 sets - 10 reps - Standing Hip Abduction with Resistance at Ankles and Counter Support  - 2 x daily - 7 x weekly - 2 sets - 10 reps - Standing  Hip Extension with Resistance at Ankles and Counter Support  - 2 x daily - 7 x weekly - 2 sets - 10 reps - Standing Tandem Balance with Counter Support  - 2 x daily - 7 x weekly - 1 sets - 3 reps - 30 seconds hold - Sit to Stand Without Arm Support  - 2 x daily - 7 x weekly - 2 sets - 10 reps - Seated Long Arc Quad  - 2 x daily - 7 x weekly - 2 sets - 10 reps - 5 second hold  GOALS: Goals reviewed with patient? Yes  SHORT TERM GOALS: Target date: 04/02/22  Pt will demonstrate indep in HEP to facilitate carry-over of skilled services and improve functional outcomes Goal status: MET  LONG TERM GOALS: Target date: 04/16/22  Pt will improve DGI by at least 3 points in order to demonstrate clinically significant improvement in balance and decreased risk for falls Baseline: 23 (improved 3 pts) Goal status: MET  2.  Pt will demonstrate increase in LE strength to 5 to facilitate ease and safety in ambulation Baseline: See MMT Goal status: Partly MET  3.  Pt will increase by at least 40 ft in order to demonstrate clinically significant improvement in community ambulation Baseline: 678 ft (improved 174 feet) Goal status: MET  4.  Patient will demonstrate improved coordination as manifested by being able to perform alt toe taps/raises correctly in 3/4 trials to facilitate improvement in ambulation Baseline: impaired Goal status: MET   ASSESSMENT:  CLINICAL IMPRESSION: Patient has made very good progress in therapy. He shows significant improvement in balance and strength measures. Patient demos good mechanics with gait and can safely ambulate stairs with no AD. At this time, patient will be DC from therapy due to goals met. Discussed progress and reviewed HEP. Answered all patient questions. Encouraged patient to follow up with therapy services with any further questions or concerns.   OBJECTIVE IMPAIRMENTS: Abnormal gait, decreased balance, difficulty walking, decreased strength, and  impaired flexibility.   ACTIVITY LIMITATIONS: locomotion level  PARTICIPATION LIMITATIONS: shopping and community activity  PERSONAL FACTORS:  none  are also affecting patient's functional outcome.   REHAB POTENTIAL: Excellent  CLINICAL DECISION MAKING: Stable/uncomplicated  EVALUATION COMPLEXITY: Low  PLAN:  PT FREQUENCY: 2x/week  PT DURATION: 4 weeks  PLANNED INTERVENTIONS: Therapeutic exercises, Therapeutic activity, Neuromuscular re-education, Balance training, Gait  training, Patient/Family education, Self Care, Stair training, and Manual therapy  PLAN FOR NEXT SESSION: DC to HEP   11:08 AM, 04/20/22 Georges Lynch PT DPT  Physical Therapist with [facility]  [phone]

## 2022-04-22 ENCOUNTER — Ambulatory Visit (HOSPITAL_COMMUNITY): Payer: 59 | Admitting: Physical Therapy

## 2022-04-27 ENCOUNTER — Ambulatory Visit (HOSPITAL_COMMUNITY): Payer: 59 | Admitting: Physical Therapy

## 2022-06-14 ENCOUNTER — Emergency Department (HOSPITAL_COMMUNITY): Payer: 59

## 2022-06-14 ENCOUNTER — Encounter (HOSPITAL_COMMUNITY): Payer: Self-pay

## 2022-06-14 ENCOUNTER — Emergency Department (HOSPITAL_COMMUNITY)
Admission: EM | Admit: 2022-06-14 | Discharge: 2022-06-14 | Disposition: A | Discharge status: Home / Self Care | Payer: 59 | Attending: Emergency Medicine | Admitting: Emergency Medicine

## 2022-06-14 DIAGNOSIS — F447 Conversion disorder with mixed symptom presentation: Secondary | ICD-10-CM | POA: Diagnosis not present

## 2022-06-14 DIAGNOSIS — R531 Weakness: Secondary | ICD-10-CM | POA: Diagnosis present

## 2022-06-14 DIAGNOSIS — R4781 Slurred speech: Secondary | ICD-10-CM

## 2022-06-14 DIAGNOSIS — R2990 Unspecified symptoms and signs involving the nervous system: Secondary | ICD-10-CM | POA: Diagnosis not present

## 2022-06-14 LAB — CBC
HCT: 42.8 % (ref 39.0–52.0)
Hemoglobin: 14.9 g/dL (ref 13.0–17.0)
MCH: 29.7 pg (ref 26.0–34.0)
MCHC: 34.8 g/dL (ref 30.0–36.0)
MCV: 85.4 fL (ref 80.0–100.0)
Platelets: 162 10*3/uL (ref 150–400)
RBC: 5.01 MIL/uL (ref 4.22–5.81)
RDW: 12.8 % (ref 11.5–15.5)
WBC: 6.8 10*3/uL (ref 4.0–10.5)
nRBC: 0 % (ref 0.0–0.2)

## 2022-06-14 LAB — DIFFERENTIAL
Abs Immature Granulocytes: 0.03 10*3/uL (ref 0.00–0.07)
Basophils Absolute: 0 10*3/uL (ref 0.0–0.1)
Basophils Relative: 1 %
Eosinophils Absolute: 0.1 10*3/uL (ref 0.0–0.5)
Eosinophils Relative: 2 %
Immature Granulocytes: 0 %
Lymphocytes Relative: 12 %
Lymphs Abs: 0.8 10*3/uL (ref 0.7–4.0)
Monocytes Absolute: 0.6 10*3/uL (ref 0.1–1.0)
Monocytes Relative: 9 %
Neutro Abs: 5.2 10*3/uL (ref 1.7–7.7)
Neutrophils Relative %: 76 %

## 2022-06-14 LAB — COMPREHENSIVE METABOLIC PANEL
ALT: 30 U/L (ref 0–44)
AST: 25 U/L (ref 15–41)
Albumin: 3.8 g/dL (ref 3.5–5.0)
Alkaline Phosphatase: 73 U/L (ref 38–126)
Anion gap: 10 (ref 5–15)
BUN: 21 mg/dL — ABNORMAL HIGH (ref 6–20)
CO2: 24 mmol/L (ref 22–32)
Calcium: 8.6 mg/dL — ABNORMAL LOW (ref 8.9–10.3)
Chloride: 104 mmol/L (ref 98–111)
Creatinine, Ser: 1.31 mg/dL — ABNORMAL HIGH (ref 0.61–1.24)
GFR, Estimated: >60 mL/min (ref >60)
Glucose, Bld: 149 mg/dL — ABNORMAL HIGH (ref 70–99)
Potassium: 3.6 mmol/L (ref 3.5–5.1)
Sodium: 138 mmol/L (ref 135–145)
Total Bilirubin: 0.8 mg/dL (ref 0.3–1.2)
Total Protein: 6.5 g/dL (ref 6.5–8.1)

## 2022-06-14 LAB — I-STAT CHEM 8, ED
BUN: 24 mg/dL — ABNORMAL HIGH (ref 6–20)
Calcium, Ion: 1.1 mmol/L — ABNORMAL LOW (ref 1.15–1.40)
Chloride: 104 mmol/L (ref 98–111)
Creatinine, Ser: 1.3 mg/dL — ABNORMAL HIGH (ref 0.61–1.24)
Glucose, Bld: 150 mg/dL — ABNORMAL HIGH (ref 70–99)
HCT: 43 % (ref 39.0–52.0)
Hemoglobin: 14.6 g/dL (ref 13.0–17.0)
Potassium: 3.6 mmol/L (ref 3.5–5.1)
Sodium: 140 mmol/L (ref 135–145)
TCO2: 24 mmol/L (ref 22–32)

## 2022-06-14 LAB — APTT: aPTT: 27 seconds (ref 24–36)

## 2022-06-14 LAB — PROTIME-INR
INR: 1 (ref 0.8–1.2)
Prothrombin Time: 13.5 seconds (ref 11.4–15.2)

## 2022-06-14 LAB — ETHANOL: Alcohol, Ethyl (B): <10 mg/dL (ref <10)

## 2022-06-14 LAB — CBG MONITORING, ED: Glucose-Capillary: 156 mg/dL — ABNORMAL HIGH (ref 70–99)

## 2022-06-14 MED ORDER — SODIUM CHLORIDE 0.9% FLUSH: 3.0000 mL | Freq: Once | INTRAVENOUS | Status: DC

## 2022-06-14 NOTE — Discharge Instructions (Addendum)
You were seen today for strokelike symptoms.  Your MRI does not indicate a stroke.  Your symptoms may be stress-induced.  I have sent a referral to neurology.  You should also follow-up with your primary doctor.

## 2022-06-14 NOTE — ED Notes (Signed)
Attempted to ambulate pt. Pt able to move from laying to sitting position by self. Pt was assisted to standing with minimal help. Pt able to balance on left leg and step forward with right leg. When standing on right leg and attempting to step forward with left leg, pt buckles right knee but with a controlled movement pt able to sit back into bed. Pt then moves legs by self to get fully back into bed.

## 2022-06-14 NOTE — ED Provider Notes (Signed)
[place] EMERGENCY DEPARTMENT AT [facility] Provider Note   CSN: [id number] Arrival date & time: 06/14/22  0234  An emergency department physician performed an initial assessment on this suspected stroke patient at 0237.  History  Chief Complaint  Patient presents with   Code Stroke    Alexander Carroll is a 57 y.o. male.  HPI     This is a 57 year old male brought in by [facility] with concern for left-sided weakness and inability to speak.  Presents as a code stroke.  He had a similar presentation in February in which she received TNK.  He was last known well at 0130.  He was found by his wife unresponsive.  Concerns that he was not speaking and unable to move his left arm.  At baseline he is able to speak.  Chart review shows that he had an MRI after being treated with TNK in February which was negative for acute stroke.  Exam was more consistent at that time with functional symptoms.  Level 5 caveat for code stroke  Home Medications Prior to Admission medications   Medication Sig Start Date End Date Taking? Authorizing Provider  acetaminophen (TYLENOL) 500 MG tablet Take 1,000 mg by mouth as needed for mild pain, moderate pain or headache.    [provider]  Calcium Carbonate Antacid (TUMS PO) Take 1-2 tablets by mouth as needed (heartburn).    [provider]  famotidine (PEPCID) 40 MG tablet Take 40 mg by mouth daily.    [provider]      Allergies    Penicillins    Review of Systems   Review of Systems  Neurological:  Positive for speech difficulty and weakness.  All other systems reviewed and are negative.   Physical Exam Updated Vital Signs BP (!) 143/91   Pulse 70   Temp 98.2 F (36.8 C) (Oral)   Resp 18   Wt 118.6 kg   SpO2 95%   BMI 33.57 kg/m  Physical Exam Vitals and nursing note reviewed.  Constitutional:      Appearance: He is well-developed.     Comments: Eyes clamped tight, responds to voice   HENT:     Head: Normocephalic and atraumatic.     Mouth/Throat:     Mouth: Mucous membranes are moist.  Eyes:     Pupils: Pupils are equal, round, and reactive to light.  Cardiovascular:     Rate and Rhythm: Normal rate and regular rhythm.  Pulmonary:     Effort: Pulmonary effort is normal. No respiratory distress.  Abdominal:     General: Bowel sounds are normal.     Palpations: Abdomen is soft.     Tenderness: There is no abdominal tenderness. There is no rebound.  Musculoskeletal:     Cervical back: Neck supple.  Lymphadenopathy:     Cervical: No cervical adenopathy.  Skin:    General: Skin is warm and dry.  Neurological:     Comments: Unable to assess orientation, patient actively squinting his eyes closed, grip strength intact     ED Results / Procedures / Treatments   Labs (all labs ordered are listed, but only abnormal results are displayed) Labs Reviewed  COMPREHENSIVE METABOLIC PANEL - Abnormal; Notable for the following components:      Result Value   Glucose, Bld 149 (*)    BUN 21 (*)    Creatinine, Ser 1.31 (*)    Calcium 8.6 (*)    All other components  within normal limits  I-STAT CHEM 8, ED - Abnormal; Notable for the following components:   BUN 24 (*)    Creatinine, Ser 1.30 (*)    Glucose, Bld 150 (*)    Calcium, Ion 1.10 (*)    All other components within normal limits  CBG MONITORING, ED - Abnormal; Notable for the following components:   Glucose-Capillary 156 (*)    All other components within normal limits  PROTIME-INR  APTT  CBC  DIFFERENTIAL  ETHANOL    EKG EKG Interpretation  Date/Time:  Monday June 14 2022 02:48:45 EDT Ventricular Rate:  77 PR Interval:  162 QRS Duration: 100 QT Interval:  404 QTC Calculation: 458 R Axis:   -19 Text Interpretation: Sinus rhythm Borderline left axis deviation Abnormal R-wave progression, early transition Confirmed by Ross Marcus ([id number]) on 06/14/2022 4:10:14 AM  Radiology MR BRAIN WO  CONTRAST  Result Date: 06/14/2022 CLINICAL DATA:  57 year old male code stroke presentation with left side weakness and loss of speech, found by wife. EXAM: MRI HEAD WITHOUT CONTRAST TECHNIQUE: Multiplanar, multiecho pulse sequences of the brain and surrounding structures were obtained without intravenous contrast. COMPARISON:  Head CT 0242 hours today. Previous brain MRI 02/26/2022. FINDINGS: Brain: No restricted diffusion to suggest acute infarction. No midline shift, mass effect, evidence of mass lesion, ventriculomegaly, extra-axial collection or acute intracranial hemorrhage. Cervicomedullary junction and pituitary are within normal limits. Wallace Cullens and white matter signal appears stable from the February MRI, within normal limits for age. No cerebral edema or encephalomalacia is identified. No chronic cerebral blood products. On routine coronal T2 weighted imaging the mesial temporal lobe structures appear symmetric and within normal limits. Vascular: Major intracranial vascular flow voids appear stable since February, preserved. Skull and upper cervical spine: Negative visible cervical spine. Visualized bone marrow signal is within normal limits. Sinuses/Orbits: Stable, negative. Other: Mastoids remain well aerated. Visible internal auditory structures appear normal. Negative visible scalp and face. IMPRESSION: No acute intracranial abnormality. Stable since normal for age noncontrast MRI appearance of the brain. Electronically Signed   By: Odessa Fleming M.D.   On: 06/14/2022 04:35   CT HEAD CODE STROKE WO CONTRAST  Result Date: 06/14/2022 CLINICAL DATA:  Code stroke.  Stroke suspected EXAM: CT HEAD WITHOUT CONTRAST TECHNIQUE: Contiguous axial images were obtained from the base of the skull through the vertex without intravenous contrast. RADIATION DOSE REDUCTION: This exam was performed according to the departmental dose-optimization program which includes automated exposure control, adjustment of the mA and/or kV  according to patient size and/or use of iterative reconstruction technique. COMPARISON:  02/25/2022 CT head FINDINGS: Brain: No evidence of acute infarction, hemorrhage, mass, mass effect, or midline shift. No hydrocephalus or extra-axial collection. Vascular: Possible hyperdense left distal ICA/M1, although this may be artifactual. No unexpected calcifications. Skull: Negative for fracture or focal lesion. Sinuses/Orbits: Minimal mucosal thickening in the ethmoid air cells. No acute finding in the orbits. Other: The mastoid air cells are well aerated. ASPECTS [facility] Stroke Program Early CT Score) - Ganglionic level infarction (caudate, lentiform nuclei, internal capsule, insula, M1-M3 cortex): 7 - Supraganglionic infarction (M4-M6 cortex): 3 Total score (0-10 with 10 being normal): 10 IMPRESSION: 1. No acute intracranial hemorrhage or evidence of acute territorial infarct. 2. Possible hyperdense left distal ICA/M1, although this may be artifactual. Correlate with symptoms. ASPECTS is 10. Imaging results were communicated on 06/14/2022 at 2:52 am to provider Dr. Wilford Corner via secure text paging. Electronically Signed   By: Wiliam Ke M.D.   On:  06/14/2022 02:52    Procedures Procedures    Medications Ordered in ED Medications  sodium chloride flush (NS) 0.9 % injection 3 mL (3 mLs Intravenous Not Given 06/14/22 1610)    ED Course/ Medical Decision Making/ A&P Clinical Course as of 06/14/22 0637  Mon Jun 14, 2022  0448 Patient seen and evaluated by neurology.  Previously had an episode that was determined to be functional in nature.  MRI obtained.  MRI does not show any evidence of acute stroke.  Cleared from a neurologic perspective.  On my repeat evaluation he is now awake and alert.  No facial droop noted.  Still with garbled speech.  No other focal deficits.  Highly suspect ongoing functional behavior.  Will attempt ambulation. [CH]  [id number] Per nursing, patient had inconsistent exam.  He was able to  stand up without difficulty although when they attempted to ambulate him he he would drag his left leg and his right leg appeared to buckle.  Have high suspicion for functional films. [CH]  0636 Wife is now at the bedside.  Patient speech is better.  He has eaten without difficulty.  We discussed that his symptoms may be related to stress.  They do indicate that they have had to sell their house and will be moving soon.  Will place a referral for outpatient neurology. [CH]    Clinical Course User Index [CH] Sala Tague, Mayer Masker, MD                             Medical Decision Making Amount and/or Complexity of Data Reviewed Labs: ordered. Radiology: ordered.   This patient presents to the ED for concern of strokelike symptoms, this involves an extensive number of treatment options, and is a complaint that carries with it a high risk of complications and morbidity.  I considered the following differential and admission for this acute, potentially life threatening condition.  The differential diagnosis includes acute stroke, metabolic derangement, hypoglycemia, functional symptoms  MDM:    This is a 57 year old male who presents with strokelike symptoms.  He is overall nontoxic.  Initial vital signs are largely reassuring.  Exam is inconsistent.  He had a very similar presentation in February.  At that time he did receive TNK but had a negative MRI for stroke.  His symptoms were felt to be functional in the setting of stress.  Neurology believes this is likely the same today given his inconsistent exam.  MRI was obtained and reviewed.  MRI does not show any evidence of acute stroke.  Patient gradually improved.  He does have increase stressors right now at home.  They are requesting outpatient neurology referral for which I have placed.  We discussed stress reducing techniques with the patient and his wife.  (Labs, imaging, consults)  Labs: I Ordered, and personally interpreted labs.  The  pertinent results include: Stroke lab work  Imaging Studies ordered: I ordered imaging studies including CT head, MRI I independently visualized and interpreted imaging. I agree with the radiologist interpretation  Additional history obtained from EMS, wife.  External records from outside source obtained and reviewed including recent admission for the same  Cardiac Monitoring: The patient was maintained on a cardiac monitor.  If on the cardiac monitor, I personally viewed and interpreted the cardiac monitored which showed an underlying rhythm of: Sinus rhythm  Reevaluation: After the interventions noted above, I reevaluated the patient and found that they have :improved  Social Determinants of Health:  lives independently  Disposition: Discharge  Co morbidities that complicate the patient evaluation  Past Medical History:  Diagnosis Date   GERD (gastroesophageal reflux disease)    Hiatal hernia    SMALL     Medicines Meds ordered this encounter  Medications   sodium chloride flush (NS) 0.9 % injection 3 mL    I have reviewed the patients home medicines and have made adjustments as needed  Problem List / ED Course: Problem List Items Addressed This Visit   None Visit Diagnoses     Slurred speech    -  Primary   Stroke-like symptoms                       Final Clinical Impression(s) / ED Diagnoses Final diagnoses:  Slurred speech  Stroke-like symptoms    Rx / DC Orders ED Discharge Orders          Ordered    Ambulatory referral to Neurology       Comments: An appointment is requested in approximately: 2 weeks   06/14/22 0635              Shon Baton, MD 06/14/22 [phone]

## 2022-06-14 NOTE — Code Documentation (Signed)
Stroke Response Nurse Documentation Code Documentation  Alexander Carroll is a 57 y.o. male arriving to [facility]  via [facility] on 6/3 with past medical hx of hiatal hernia and GERD. On No antithrombotic. Code stroke was activated by EMS.   Patient from home where he was LKW at 0100 and now complaining of unable to speak and unable to move left side.   Stroke team at the bedside on patient arrival. Labs drawn and patient cleared for CT by Dr. Wilkie Aye. Patient to CT with team. NIHSS 7, see documentation for details and code stroke times. Patient with left arm weakness, bilateral leg weakness, left decreased sensation, and dysarthria  on exam. The following imaging was completed:  CT Head. Patient is not a candidate for IV Thrombolytic due to inconsistent symptoms, waxing and waning exam. Patient is not a candidate for IR due to Low suspicion of LVO.   Care Plan: MRI negative. Code stroke cancelled.   Bedside handoff with ED RN Juliette Alcide.    Rose Fillers  Rapid Response RN
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
## 2022-06-14 NOTE — ED Triage Notes (Signed)
PT BIB [facility] with c/o left sided weakness and unable to speak. LNK 0130. Pt found by wife unresponsive on bed. Per EMS, pt was unable to move L arm and not speaking. Pt able to speak on arrival but speech is garbled.

## 2022-06-14 NOTE — ED Notes (Addendum)
Pts wife updated at this time.

## 2022-07-14 ENCOUNTER — Encounter: Payer: Self-pay | Admitting: Neurology

## 2022-07-14 ENCOUNTER — Ambulatory Visit: Payer: 59 | Admitting: Neurology

## 2022-07-14 VITALS — BP 134/94 | HR 83 | Ht 73.0 in | Wt 258.5 lb

## 2022-07-14 DIAGNOSIS — R404 Transient alteration of awareness: Secondary | ICD-10-CM

## 2022-07-14 DIAGNOSIS — R4781 Slurred speech: Secondary | ICD-10-CM | POA: Diagnosis not present

## 2022-07-14 DIAGNOSIS — R29898 Other symptoms and signs involving the musculoskeletal system: Secondary | ICD-10-CM | POA: Diagnosis not present

## 2022-07-14 NOTE — Progress Notes (Signed)
[facility]  PATIENT: Alexander Carroll DOB: [birth year]  REFERRING DOCTOR OR PCP:   Ross Marcus, MD; Assunta Found, MD SOURCE: Patient, wife, notes from emergency room and admissions, imaging and lab reports, MRI and CT scan images personally reviewed.  _________________________________   HISTORICAL  CHIEF COMPLAINT:  Chief Complaint  Patient presents with   Room 11    Pt is here with his Wife. Pt's wife states that his slurred speech started 3-4 weeks ago. Pt's wife states that pt's speech has gotten better. Pt states he hasn't noticed any new changes.    HISTORY OF PRESENT ILLNESS:  I had the pleasure of seeing your patient, Alexander Carroll, at [facility] for neurologic consultation regarding his episode of slurred speech in early June 2024.  In February 25, 2022, he had sudden onset aphasia and LEFT sided weakness. Marland Kitchen  His wife noted he fell to the floor and his eyes seemed to be jerking under the eyelids.. He was completely unresponsive for a few minutes.   He went to the [facility]  ED and was sent to the  [facility] ED. He was unresponsive during the ambulance ride and in [facility].   He continued to have symptoms but ws responsive and able to talk some at [facility].  CT was read as showing right temporal.  He was given TNK per protocol.    He started to improve some the next day but speech was slurred a couple days more. Over the next week, he returned to baseline.   He was discharged on a statin.    He was fine form March to May.   Then he had the same symptoms 06/14/2022 and fell to the floor.   His wife noted eyes ad hand twitches and he was unresponsive.   He presented to the [facility] ED.  By the time he got there, he was responsive and a little confused and had slurred speech.   MRI was normal.     His exam was felt to be inconsistent and etiology uncertain.   Therefore, a conversion disorder was felt to be more likely than a stroke.  Marland KitchenHe had forced eyelid  closure and a positive Hoover sign  He has had some stress with their rented house being sold   IMAGING personally reviewed. CT and MRI of the head 06/14/2022 was normal for age.  CT scan of the head and CT angiogram of the head and neck 02/25/2022 was normal.  MRI of the brain 02/26/2022 was normal.    REVIEW OF SYSTEMS: Constitutional: No fevers, chills, sweats, or change in appetite Eyes: No visual changes, double vision, eye pain Ear, nose and throat: No hearing loss, ear pain, nasal congestion, sore throat Cardiovascular: No chest pain, palpitations Respiratory:  No shortness of breath at rest or with exertion.   No wheezes GastrointestinaI: No nausea, vomiting, diarrhea, abdominal pain, fecal incontinence Genitourinary:  No dysuria, urinary retention or frequency.  No nocturia. Musculoskeletal:  No neck pain, back pain Integumentary: No rash, pruritus, skin lesions Neurological: as above Psychiatric: No depression at this time.  No anxiety Endocrine: No palpitations, diaphoresis, change in appetite, change in weigh or increased thirst Hematologic/Lymphatic:  No anemia, purpura, petechiae. Allergic/Immunologic: No itchy/runny eyes, nasal congestion, recent allergic reactions, rashes  ALLERGIES: Allergies  Allergen Reactions   Penicillins Hives and Itching    Has patient had a PCN reaction causing immediate rash, facial/tongue/throat swelling, SOB or lightheadedness with hypotension: Unknown Has patient had a  PCN reaction causing severe rash involving mucus membranes or skin necrosis: Unknown Has patient had a PCN reaction that required hospitalization: Unknown Has patient had a PCN reaction occurring within the last 10 years: No Childhood reaction. If all of the above answers are "NO", then may proceed with Cephalosporin use.     HOME MEDICATIONS:  Current Outpatient Medications:    acetaminophen (TYLENOL) 500 MG tablet, Take 1,000 mg by mouth as needed for mild pain,  moderate pain or headache., Disp: , Rfl:    Calcium Carbonate Antacid (TUMS PO), Take 1-2 tablets by mouth as needed (heartburn)., Disp: , Rfl:    famotidine (PEPCID) 40 MG tablet, Take 40 mg by mouth daily. (Patient not taking: Reported on 07/14/2022), Disp: , Rfl:   PAST MEDICAL HISTORY: Past Medical History:  Diagnosis Date   GERD (gastroesophageal reflux disease)    Hiatal hernia    SMALL    PAST SURGICAL HISTORY: Past Surgical History:  Procedure Laterality Date   BIOPSY  11/12/2016   Procedure: BIOPSY;  Surgeon: West Bali, MD;  [facility];  Service: Endoscopy;;  gastric and esophageal   COLONOSCOPY  2018   [facility]: BUCCINI   ESOPHAGOGASTRODUODENOSCOPY N/A 11/12/2016   Procedure: ESOPHAGOGASTRODUODENOSCOPY (EGD);  Surgeon: West Bali, MD;  [facility];  Service: Endoscopy;  Laterality: N/A;  830    SAVORY DILATION N/A 11/12/2016   Procedure: SAVORY DILATION;  Surgeon: West Bali, MD;  [facility];  Service: Endoscopy;  Laterality: N/A;    FAMILY HISTORY: Family History  Problem Relation Age of Onset   Kidney failure Mother    Leukemia Father    Colon cancer Neg Hx    Gastric cancer Neg Hx    Esophageal cancer Neg Hx     SOCIAL HISTORY: Social History   Socioeconomic History   Marital status: Married    Spouse name: Not on file   Number of children: Not on file   Years of education: Not on file   Highest education level: Not on file  Occupational History   Not on file  Tobacco Use   Smoking status: Never   Smokeless tobacco: Never  Vaping Use   Vaping Use: Never used  Substance and Sexual Activity   Alcohol use: Not Currently   Drug use: No   Sexual activity: Yes  Other Topics Concern   Not on file  Social History Narrative   Not on file   Social Determinants of Health   Financial Resource Strain: Not on file  Food Insecurity: Not on file  Transportation Needs: Not on file  Physical Activity: Not on  file  Stress: Not on file  Social Connections: Not on file  Intimate Partner Violence: Not on file       PHYSICAL EXAM  Vitals:   07/14/22 1543  BP: (!) 134/94  Pulse: 83  Weight: 258 lb 8 oz (117.3 kg)  Height: 6\' 1"  (1.854 m)    Body mass index is 34.1 kg/m.   General: The patient is well-developed and well-nourished and in no acute distress  HEENT:  Head is [place]/AT.  Sclera are anicteric.  Funduscopic exam shows normal optic discs and retinal vessels.  Neck: No carotid bruits are noted.  The neck is nontender.  Cardiovascular: The heart has a regular rate and rhythm with a normal S1 and S2. There were no murmurs, gallops or rubs.    Skin: Extremities are without rash or  edema.  Musculoskeletal:  Back is nontender  Neurologic Exam  Mental status: Affect was appropriate.  The patient is alert and oriented x 3 at the time of the examination. The patient has apparent normal recent and remote memory, with an apparently normal attention span and concentration ability.   Speech is normal.  Cranial nerves: Extraocular movements are full. Pupils are equal, round, and reactive to light and accomodation.  Visual fields are full.  Facial symmetry is present. There is good facial sensation to soft touch bilaterally.Facial strength is normal.  Trapezius and sternocleidomastoid strength is normal. No dysarthria is noted.  The tongue is midline, and the patient has symmetric elevation of the soft palate. No obvious hearing deficits are noted.  Motor:  Muscle bulk is normal.   Tone is normal. Strength is  5 / 5 in all 4 extremities.   Sensory: Sensory testing is intact to pinprick, soft touch and vibration sensation in all 4 extremities.  Coordination: Cerebellar testing reveals good finger-nose-finger and heel-to-shin bilaterally.  Gait and station: Station is normal.   Gait is normal. Tandem gait is fairly normal. Romberg is negative.   Reflexes: Deep tendon reflexes are symmetric  and normal bilaterally.   Plantar responses are flexor.    DIAGNOSTIC DATA (LABS, IMAGING, TESTING) - I reviewed patient records, labs, notes, testing and imaging myself where available.  Lab Results  Component Value Date   WBC 6.8 06/14/2022   HGB 14.6 06/14/2022   HCT 43.0 06/14/2022   MCV 85.4 06/14/2022   PLT 162 06/14/2022      Component Value Date/Time   NA 140 06/14/2022 0242   K 3.6 06/14/2022 0242   CL 104 06/14/2022 0242   CO2 24 06/14/2022 0235   GLUCOSE 150 (H) 06/14/2022 0242   BUN 24 (H) 06/14/2022 0242   CREATININE 1.30 (H) 06/14/2022 0242   CALCIUM 8.6 (L) 06/14/2022 0235   PROT 6.5 06/14/2022 0235   ALBUMIN 3.8 06/14/2022 0235   AST 25 06/14/2022 0235   ALT 30 06/14/2022 0235   ALKPHOS 73 06/14/2022 0235   BILITOT 0.8 06/14/2022 0235   GFRNONAA >60 06/14/2022 0235   GFRAA >60 08/17/2018 1040   Lab Results  Component Value Date   CHOL 131 02/26/2022   HDL 32 (L) 02/26/2022   LDLCALC 84 02/26/2022   TRIG 73 02/26/2022   CHOLHDL 4.1 02/26/2022   Lab Results  Component Value Date   HGBA1C 4.8 02/26/2022   No results found for: "VITAMINB12" Lab Results  Component Value Date   TSH 5.28 (H) 09/09/2017       ASSESSMENT AND PLAN  Altered awareness, transient - Plan: EEG adult  Left arm weakness  Slurred speech  In summary, Mr. Kruckeberg is a 57 year old man who has had 2 episodes of reduced awareness associated with left sided weakness and speech disturbance.  His wife noted that there was some twitching at the onset of the symptoms.  It is most likely that he had 2 strokes since the imaging studies showed no abnormality.  There was a concern about a functional neurologic examination when he was in the hospital.  I discussed with him and his wife that that is certainly a possibility.  He notes some stress though he does not have a history of anxiety and the stress does not seem to be extreme.  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  Therefore, we will also check an EEG.  If the EEG is normal, a conversion disorder might be more possible.  If the EEG does show epileptiform or lateralizing changes, then I would consider starting an antiepileptic agent.  He will return to see me based on the results of the study and whether or not he has new or worsening neurologic symptoms.  Thank you for asking me to see Mr. Arco.  Please let me know when we have further assistance with him or other patients in the future.   Seizure vs. Conversion EEG   Sarahbeth Cashin A. Epimenio Foot, MD, Edwin Cap 07/14/2022, 4:08 PM Certified in Neurology, Clinical Neurophysiology, Sleep Medicine and Neuroimaging  [facility] [address] [place], [place] [address] [phone]

## 2022-07-25 ENCOUNTER — Encounter (HOSPITAL_COMMUNITY): Payer: Self-pay

## 2022-07-25 ENCOUNTER — Emergency Department (HOSPITAL_COMMUNITY): Payer: 59

## 2022-07-25 ENCOUNTER — Other Ambulatory Visit: Payer: Self-pay

## 2022-07-25 ENCOUNTER — Observation Stay (HOSPITAL_COMMUNITY)
Admission: EM | Admit: 2022-07-25 | Discharge: 2022-07-26 | Disposition: A | Discharge status: Home / Self Care | Payer: 59 | Attending: Family Medicine | Admitting: Family Medicine

## 2022-07-25 DIAGNOSIS — K219 Gastro-esophageal reflux disease without esophagitis: Secondary | ICD-10-CM | POA: Diagnosis not present

## 2022-07-25 DIAGNOSIS — Z6834 Body mass index (BMI) 34.0-34.9, adult: Secondary | ICD-10-CM | POA: Insufficient documentation

## 2022-07-25 DIAGNOSIS — E669 Obesity, unspecified: Secondary | ICD-10-CM | POA: Diagnosis not present

## 2022-07-25 DIAGNOSIS — R471 Dysarthria and anarthria: Principal | ICD-10-CM

## 2022-07-25 DIAGNOSIS — I10 Essential (primary) hypertension: Secondary | ICD-10-CM | POA: Diagnosis not present

## 2022-07-25 DIAGNOSIS — I639 Cerebral infarction, unspecified: Secondary | ICD-10-CM | POA: Diagnosis present

## 2022-07-25 DIAGNOSIS — Z79899 Other long term (current) drug therapy: Secondary | ICD-10-CM | POA: Diagnosis not present

## 2022-07-25 DIAGNOSIS — R479 Unspecified speech disturbances: Secondary | ICD-10-CM | POA: Diagnosis present

## 2022-07-25 DIAGNOSIS — R519 Headache, unspecified: Secondary | ICD-10-CM | POA: Diagnosis not present

## 2022-07-25 DIAGNOSIS — R4781 Slurred speech: Secondary | ICD-10-CM | POA: Diagnosis not present

## 2022-07-25 LAB — COMPREHENSIVE METABOLIC PANEL
ALT: 34 U/L (ref 0–44)
AST: 25 U/L (ref 15–41)
Albumin: 4.1 g/dL (ref 3.5–5.0)
Alkaline Phosphatase: 75 U/L (ref 38–126)
Anion gap: 9 (ref 5–15)
BUN: 21 mg/dL — ABNORMAL HIGH (ref 6–20)
CO2: 24 mmol/L (ref 22–32)
Calcium: 8.6 mg/dL — ABNORMAL LOW (ref 8.9–10.3)
Chloride: 104 mmol/L (ref 98–111)
Creatinine, Ser: 1.12 mg/dL (ref 0.61–1.24)
GFR, Estimated: >60 mL/min (ref >60)
Glucose, Bld: 107 mg/dL — ABNORMAL HIGH (ref 70–99)
Potassium: 3.6 mmol/L (ref 3.5–5.1)
Sodium: 137 mmol/L (ref 135–145)
Total Bilirubin: 0.7 mg/dL (ref 0.3–1.2)
Total Protein: 7.1 g/dL (ref 6.5–8.1)

## 2022-07-25 LAB — DIFFERENTIAL
Abs Immature Granulocytes: 0.03 10*3/uL (ref 0.00–0.07)
Basophils Absolute: 0 10*3/uL (ref 0.0–0.1)
Basophils Relative: 1 %
Eosinophils Absolute: 0.2 10*3/uL (ref 0.0–0.5)
Eosinophils Relative: 3 %
Immature Granulocytes: 1 %
Lymphocytes Relative: 16 %
Lymphs Abs: 1 10*3/uL (ref 0.7–4.0)
Monocytes Absolute: 0.7 10*3/uL (ref 0.1–1.0)
Monocytes Relative: 10 %
Neutro Abs: 4.6 10*3/uL (ref 1.7–7.7)
Neutrophils Relative %: 69 %

## 2022-07-25 LAB — CBC
HCT: 44.2 % (ref 39.0–52.0)
Hemoglobin: 15.7 g/dL (ref 13.0–17.0)
MCH: 30 pg (ref 26.0–34.0)
MCHC: 35.5 g/dL (ref 30.0–36.0)
MCV: 84.4 fL (ref 80.0–100.0)
Platelets: 175 10*3/uL (ref 150–400)
RBC: 5.24 MIL/uL (ref 4.22–5.81)
RDW: 12.7 % (ref 11.5–15.5)
WBC: 6.5 10*3/uL (ref 4.0–10.5)
nRBC: 0 % (ref 0.0–0.2)

## 2022-07-25 LAB — APTT: aPTT: 26 seconds (ref 24–36)

## 2022-07-25 LAB — ETHANOL: Alcohol, Ethyl (B): <10 mg/dL (ref <10)

## 2022-07-25 LAB — PROTIME-INR
INR: 1 (ref 0.8–1.2)
Prothrombin Time: 12.9 seconds (ref 11.4–15.2)

## 2022-07-25 LAB — CBG MONITORING, ED: Glucose-Capillary: 108 mg/dL — ABNORMAL HIGH (ref 70–99)

## 2022-07-25 MED ORDER — PANTOPRAZOLE SODIUM 40 MG PO TBEC
40.0000 mg | DELAYED_RELEASE_TABLET | Freq: Every day | ORAL | Status: DC
  Administered 2022-07-26: 40 mg via ORAL
  Filled 2022-07-25: qty 1

## 2022-07-25 MED ORDER — ONDANSETRON HCL 4 MG/2ML IJ SOLN: 4.0000 mg | Freq: Four times a day (QID) | INTRAMUSCULAR | Status: DC | PRN

## 2022-07-25 MED ORDER — DIPHENHYDRAMINE HCL 50 MG/ML IJ SOLN
12.5000 mg | Freq: Once | INTRAMUSCULAR | Status: AC
  Administered 2022-07-25: 12.5 mg via INTRAVENOUS
  Filled 2022-07-25: qty 1

## 2022-07-25 MED ORDER — ONDANSETRON HCL 4 MG PO TABS: 4.0000 mg | ORAL_TABLET | Freq: Four times a day (QID) | ORAL | Status: DC | PRN

## 2022-07-25 MED ORDER — ACETAMINOPHEN 500 MG PO TABS
1000.0000 mg | ORAL_TABLET | Freq: Once | ORAL | Status: AC
  Administered 2022-07-25: 1000 mg via ORAL
  Filled 2022-07-25: qty 2

## 2022-07-25 MED ORDER — ACETAMINOPHEN 325 MG PO TABS: 650.0000 mg | ORAL_TABLET | Freq: Four times a day (QID) | ORAL | Status: DC | PRN

## 2022-07-25 MED ORDER — PROCHLORPERAZINE EDISYLATE 10 MG/2ML IJ SOLN
10.0000 mg | Freq: Once | INTRAMUSCULAR | Status: AC
  Administered 2022-07-25: 10 mg via INTRAVENOUS
  Filled 2022-07-25: qty 2

## 2022-07-25 MED ORDER — ACETAMINOPHEN 650 MG RE SUPP: 650.0000 mg | Freq: Four times a day (QID) | RECTAL | Status: DC | PRN

## 2022-07-25 NOTE — ED Triage Notes (Signed)
Pt to ED from home via [facility] with stroke like symptoms (dysarthria and headache) onset at [facility]

## 2022-07-25 NOTE — H&P (Signed)
History and Physical    Patient: Alexander Carroll MVH:[id number] DOB: 18-Feb-1965 DOA: 07/25/2022 DOS: the patient was seen and examined on 07/25/2022 PCP: Assunta Found, MD  Patient coming from: Home  Chief Complaint:  Chief Complaint  Patient presents with   Code Stroke   HPI: Alexander Carroll is a 57 y.o. male with medical history significant of GERD, hiatal hernia who presents to the emergency department via EMS due to speech impairment, frontal headache which started about 20-30 minutes PTA.  Patient had similar presentation in February 2024 and this required TNK.  Patient also presented to the ED last month with similar presentation and his symptoms were thought to be functional at that time.  He was seen by neurologist about 11 days ago with plans to obtain an EEG.  ED Course:  In the emergency department, BP was 160/92, but other vital signs were within normal range.  Workup in the ED showed normal CBC and BMP except for blood glucose of 107 and BUN of 21.  Alcohol level was less than 10. CT head without contrast showed no evidence of acute intracranial abnormality Telemetry neurology was consulted, thrombolytics was suggested, but patient refused and recommendation was to admit patient for workup for complex headache vs seizure vs stroke vs conversion disorder. Patient was treated with Tylenol, Compazine and Benadryl. Hospitalist was asked to admit patient for further evaluation and management.   Review of Systems: Review of systems as noted in the HPI. All other systems reviewed and are negative.   Past Medical History:  Diagnosis Date   GERD (gastroesophageal reflux disease)    Hiatal hernia    SMALL   Past Surgical History:  Procedure Laterality Date   BIOPSY  11/12/2016   Procedure: BIOPSY;  Surgeon: West Bali, MD;  [facility];  Service: Endoscopy;;  gastric and esophageal   COLONOSCOPY  2018   [facility]: [facility]CCINI   ESOPHAGOGASTRODUODENOSCOPY N/A 11/12/2016    Procedure: ESOPHAGOGASTRODUODENOSCOPY (EGD);  Surgeon: West Bali, MD;  [facility];  Service: Endoscopy;  Laterality: N/A;  830    SAVORY DILATION N/A 11/12/2016   Procedure: SAVORY DILATION;  Surgeon: West Bali, MD;  [facility];  Service: Endoscopy;  Laterality: N/A;    Social History:  reports that he has never smoked. He has never used smokeless tobacco. He reports that he does not currently use alcohol. He reports that he does not use drugs.   Allergies  Allergen Reactions   Penicillins Hives and Itching    Has patient had a PCN reaction causing immediate rash, facial/tongue/throat swelling, SOB or lightheadedness with hypotension: Unknown Has patient had a PCN reaction causing severe rash involving mucus membranes or skin necrosis: Unknown Has patient had a PCN reaction that required hospitalization: Unknown Has patient had a PCN reaction occurring within the last 10 years: No Childhood reaction. If all of the above answers are "NO", then may proceed with Cephalosporin use.     Family History  Problem Relation Age of Onset   Kidney failure Mother    Leukemia Father    Colon cancer Neg Hx    Gastric cancer Neg Hx    Esophageal cancer Neg Hx       Prior to Admission medications   Medication Sig Start Date End Date Taking? Authorizing Provider  acetaminophen (TYLENOL) 500 MG tablet Take 1,000 mg by mouth as needed for mild pain, moderate pain or headache.   Yes [provider]  Calcium Carbonate Antacid (TUMS PO) Take 1-2 tablets by mouth as needed (heartburn).   Yes [provider]    Physical Exam: BP (!) 160/92   Pulse 75   Temp 98.7 F (37.1 C) (Oral)   Resp 16   Ht 6\' 1"  (1.854 m)   Wt 119.2 kg   SpO2 96%   BMI 34.66 kg/m   General: 57 y.o. year-old male well developed well nourished in no acute distress.  Alert and oriented x3. HEENT: NCAT, EOMI Neck: Supple, trachea medial Cardiovascular: Regular  rate and rhythm with no rubs or gallops.  No thyromegaly or JVD noted.  No lower extremity edema. 2/4 pulses in all 4 extremities. Respiratory: Clear to auscultation with no wheezes or rales. Good inspiratory effort. Abdomen: Soft, nontender nondistended with normal bowel sounds x4 quadrants. Muskuloskeletal: No cyanosis, clubbing or edema noted bilaterally Neuro: Mild slurred speech (though still understandable).  Sensation, reflexes intact Skin: No ulcerative lesions noted or rashes Psychiatry: Mood is appropriate for condition and setting          Labs on Admission:  Basic Metabolic Panel: Recent Labs  Lab 07/25/22 2011  NA 137  K 3.6  CL 104  CO2 24  GLUCOSE 107*  BUN 21*  CREATININE 1.12  CALCIUM 8.6*   Liver Function Tests: Recent Labs  Lab 07/25/22 2011  AST 25  ALT 34  ALKPHOS 75  BILITOT 0.7  PROT 7.1  ALBUMIN 4.1   No results for input(s): "LIPASE", "AMYLASE" in the last 168 hours. No results for input(s): "AMMONIA" in the last 168 hours. CBC: Recent Labs  Lab 07/25/22 2011  WBC 6.5  NEUTROABS 4.6  HGB 15.7  HCT 44.2  MCV 84.4  PLT 175   Cardiac Enzymes: No results for input(s): "CKTOTAL", "CKMB", "CKMBINDEX", "TROPONINI" in the last 168 hours.  BNP (last 3 results) No results for input(s): "BNP" in the last 8760 hours.  ProBNP (last 3 results) No results for input(s): "PROBNP" in the last 8760 hours.  CBG: Recent Labs  Lab 07/25/22 2009  GLUCAP 108*    Radiological Exams on Admission: CT HEAD CODE STROKE WO CONTRAST  Result Date: 07/25/2022 CLINICAL DATA:  Code stroke. Slurred speech, difficulty speaking, frontal headache EXAM: CT HEAD WITHOUT CONTRAST TECHNIQUE: Contiguous axial images were obtained from the base of the skull through the vertex without intravenous contrast. RADIATION DOSE REDUCTION: This exam was performed according to the departmental dose-optimization program which includes automated exposure control, adjustment of the  mA and/or kV according to patient size and/or use of iterative reconstruction technique. COMPARISON:  06/14/2022 FINDINGS: Brain: No evidence of acute infarction, hemorrhage, mass, mass effect, or midline shift. No hydrocephalus or extra-axial collection. Vascular: No hyperdense vessel. Skull: Negative for fracture or focal lesion. Sinuses/Orbits: No acute finding. Other: The mastoid air cells are well aerated. ASPECTS [facility] Stroke Program Early CT Score) - Ganglionic level infarction (caudate, lentiform nuclei, internal capsule, insula, M1-M3 cortex): 7 - Supraganglionic infarction (M4-M6 cortex): 3 Total score (0-10 with 10 being normal): 10 IMPRESSION: No evidence of acute intracranial abnormality. ASPECTS is 10. Code stroke imaging results were communicated on 07/25/2022 at 8:08 pm to provider BUTLER via telephone, who verbally acknowledged these results. Electronically Signed   By: Wiliam Ke M.D.   On: 07/25/2022 20:09    EKG: I independently viewed the EKG done and my findings are as followed: Normal sinus rhythm with rate of 71 bpm  Assessment/Plan Present on Admission:  Cerebrovascular accident (CVA) (HCC)  GERD (gastroesophageal reflux disease)  Principal Problem:   Cerebrovascular accident (CVA) (HCC) Active Problems:   GERD (gastroesophageal reflux disease)   Headache   Obesity (BMI 30-39.9)  Slurred speech rule out acute ischemic stroke vs seizures Patient will be admitted to telemetry unit  CT head without contrast showed no acute intracranial abnormality Bilateral carotid ultrasound in the morning Echocardiogram in the morning MRI of brain without contrast in the morning EEG will be done in the morning Continue fall precautions and neuro checks Lipid panel and hemoglobin A1c will be checked Continue PT/OT eval and treat Bedside swallow eval by nursing prior to diet Neurologist will be consulted, we shall await further recommendations.  Complex headache Tylenol,  Benadryl and Compazine was given.  GERD Continue Protonix  Obesity (BMI 34.66) Diet and lifestyle modification  DVT prophylaxis: SCDs  Advance Care Planning: Full code  Consults: Neurology  Family Communication: None at bedside  Severity of Illness: The appropriate patient status for this patient is OBSERVATION. Observation status is judged to be reasonable and necessary in order to provide the required intensity of service to ensure the patient's safety. The patient's presenting symptoms, physical exam findings, and initial radiographic and laboratory data in the context of their medical condition is felt to place them at decreased risk for further clinical deterioration. Furthermore, it is anticipated that the patient will be medically stable for discharge from the hospital within 2 midnights of admission.   Author: Frankey Shown, DO 07/25/2022 10:11 PM  For on call review [email or web address].

## 2022-07-25 NOTE — Progress Notes (Signed)
1954 - Code Stroke Activated  mRS 0, LKWT 1930 1954 - Dr. Charm Barges assessing patient  49 - Patient to CT 2004 - Patient returned to room 2005 - Tele-Neurologist paged  2008 - Dr. Allena Katz on stroke cart  2010 - CT head results reviewed by Dr. Allena Katz on camera

## 2022-07-25 NOTE — Consult Note (Signed)
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 NIHSS: 2. Discussed risk vs benefits of thrombolytics with pt and he declined for now. CT head was stable. Admit for workup for complex headache vs seizure vs stroke less likely (he's had a couple of other stereotyped events in past) vs conversion d/o.    Monitor neuro checks/VS q4h with telemetry.  Recommend fall precautions and seizure precautions.  Goal SBP b/w 160-180 today, 140-160 -> 100-140 afterwards.  Start ASA + STATIN if no contraindications.  Get MRI BRAIN W/ and W/O, EEG, CAROTID US, and ECHO.  Get AMMONIA, BLOOD CX x2, COVID, UDS, ETOH, ESR/CRP, TROP, CK, TSH, B12, BNP, LACTIC ACID, LIPID PANEL, and A1C.  Get WORKUP for TOXIC/METABOLIC/INFECTIOUS causes.  PT/OT/ST eval.  Start headache cocktail acetaminophen 500 + Benadryl 25 mg + zofran 4 mg q8h prn.  Give Lorazepam 2 mg IV PRN for seizures > 3 min or > 3 episodes in one hour. Don't give more than 6-8 mg total in 24 hour period.  Please avoid aminophylline, theophylline, isoniazid, lindane, metronidazole, nalidixic acid, meropenem/imipenem, cefepime, TCAs, bupropion, cyclosporine, chlorambucil, tramadol, clozapine, or other drugs which can lower seizure threshold.    Our recommendations are outlined below.  Recommendations:        Stroke/Telemetry Floor       Neuro Checks       Bedside Swallow Eval       DVT Prophylaxis       IV Fluids, Normal Saline       Head of Bed 30  Degrees       Euglycemia and Avoid Hyperthermia (PRN Acetaminophen)  Sign Out:       Discussed with Emergency Department Provider    ------------------------------------------------------------------------------  Advanced Imaging: Advanced Imaging Deferred because: Non-disabling symptoms as verified by the patient; no cortical signs so not consistent with LVO   Metrics: Last Known Well: 07/25/2022 19:30:00 [facility] Notification Time: 07/25/2022 20:05:01 Arrival Time: 07/25/2022 19:53:20 Stamp Time: 07/25/2022 20:05:01 Initial Response Time: 07/25/2022 20:05:39 Symptoms: headache, difficulty speaking, left sided numbness/tingling. Initial patient interaction: 07/25/2022 20:11:24 NIHSS Assessment Completed: 07/25/2022 20:30:24 Patient is not a candidate for Thrombolytic. Thrombolytic Medical Decision: 07/25/2022 20:30:44 Patient was not deemed candidate for Thrombolytic because of following reasons: Patient and/or Family refused.  CT head showed no acute hemorrhage or acute core infarct.  Primary Provider Notified of Diagnostic Impression and Management Plan on: 07/25/2022 20:45:08    ------------------------------------------------------------------------------  History of Present Illness: Patient was brought by private transportation with symptoms of headache, difficulty speaking, left sided numbness/tingling.  Pt is a 29 YOM with PMH of GERD, HEADACHES who presented with headache, difficulty speaking, left sided numbness/tingling. He had a frontal headache, some nausea, no other associated symptoms. He rated it 8/10 in severity. He is taking ASA intermittently. He stated he's had 2 other episodes of similar event in stereotyped fashion with speech and left sided weakness/numbness/tingling issues with negative MRIs. He got TNK in February 2024.  -MRI  BRAIN 06/14/22: No acute intracranial abnormality. Stable since normal for age noncontrast MRI appearance of the  brain.  -MRI BRAIN 02/26/22: No evidence of acute intracranial abnormality.  -CTA 02/26/22: No intracranial large vessel occlusion or significant stenosis. No hemodynamically significant stenosis in the neck. Perfusion imaging demonstrates no core infarct or penumbra.    Past Medical History:      There is no history of Stroke      There is no history of Seizures      There is no history of Migraine Headaches  Medications:  No Anticoagulant use  Antiplatelet use: Yes ASA Reviewed EMR for current medications  Allergies:  Reviewed  Social History: Smoking: No Alcohol Use: No Drug Use: No  Family History:  There is no family history of premature cerebrovascular disease pertinent to this consultation  ROS : 14 Points Review of Systems was performed and was negative except mentioned in HPI.  Past Surgical History: There Is No Surgical History Contributory To Today's Visit    Examination: BP(149/97), Pulse(71), Blood Glucose(107) 1A: Level of Consciousness - Alert; keenly responsive + 0 1B: Ask Month and Age - Both Questions Right + 0 1C: Blink Eyes & Squeeze Hands - Performs Both Tasks + 0 2: Test Horizontal Extraocular Movements - Normal + 0 3: Test Visual Fields - No Visual Loss + 0 4: Test Facial Palsy (Use Grimace if Obtunded) - Normal symmetry + 0 5A: Test Left Arm Motor Drift - No Drift for 10 Seconds + 0 5B: Test Right Arm Motor Drift - No Drift for 10 Seconds + 0 6A: Test Left Leg Motor Drift - No Drift for 5 Seconds + 0 6B: Test Right Leg Motor Drift - No Drift for 5 Seconds + 0 7: Test Limb Ataxia (FNF/Heel-Shin) - No Ataxia + 0 8: Test Sensation - Mild-Moderate Loss: Less Sharp/More Dull + 1 9: Test Language/Aphasia - Normal; No aphasia + 0 10: Test Dysarthria - Mild-Moderate Dysarthria: Slurring but can be understood + 1 11: Test Extinction/Inattention - No abnormality + 0  NIHSS Score: 2  NIHSS Free Text : He knows age/month/year. he can read/name  items, but in odd speech. He has stuttering/old lady/parrot speech. He had left face/arm/leg sensation difference.  Pre-Morbid Modified Rankin Scale: 0 Points = No symptoms at all  Spoke with : ED  This consult was conducted in real time using interactive audio and Immunologist. Patient was informed of the technology being used for this visit and agreed to proceed. Patient located in hospital and provider located at home/office setting.   Patient is being evaluated for possible acute neurologic impairment and high probability of imminent or life-threatening deterioration. I spent total of 30 minutes providing care to this patient, including time for face to face visit via telemedicine, review of medical records, imaging studies and discussion of findings with providers, the patient and/or family.   Dr Marcene Corning   [facility] For Inpatient follow-up with [facility] physician please call [facility] [phone]. This is not an outpatient service. Post hospital discharge, please contact hospital directly.  Please do not communicate with [facility] physicians via secure chat. If you have any questions, Please contact [facility]. Please call or reconsult our service if there are any clinical or diagnostic changes.

## 2022-07-25 NOTE — ED Notes (Signed)
Attempted to call pt wife, no answer and vm full

## 2022-07-25 NOTE — ED Provider Notes (Signed)
[place] EMERGENCY DEPARTMENT AT [facility] Provider Note   CSN: [id number] Arrival date & time: 07/25/22  1953     History  No chief complaint on file.   Alexander Carroll is a 57 y.o. male.  He is presenting as a code stroke activation.  Approximately 20 to 30 minutes prior to arrival he began experiencing difficulty with speech.  Frontal headache.  He had a similar presentation back in February where he received TNK and has also been seen last month for similar.  Last notes from visit last month were concern for possible functional symptoms.  He saw neurology 11 days ago and they were planning on getting an EEG.  The history is provided by the patient and the EMS personnel.  Cerebrovascular Accident This is a recurrent problem. The current episode started less than 1 hour ago. The problem occurs constantly. The problem has not changed since onset.Associated symptoms include headaches. Pertinent negatives include no chest pain, no abdominal pain and no shortness of breath. Exacerbated by: Talking. Nothing relieves the symptoms. He has tried nothing for the symptoms. The treatment provided no relief.       Home Medications Prior to Admission medications   Medication Sig Start Date End Date Taking? Authorizing Provider  acetaminophen (TYLENOL) 500 MG tablet Take 1,000 mg by mouth as needed for mild pain, moderate pain or headache.    [provider]  Calcium Carbonate Antacid (TUMS PO) Take 1-2 tablets by mouth as needed (heartburn).    [provider]  famotidine (PEPCID) 40 MG tablet Take 40 mg by mouth daily. Patient not taking: Reported on 07/14/2022    [provider]      Allergies    Penicillins    Review of Systems   Review of Systems  Constitutional:  Negative for fever.  Eyes:  Negative for visual disturbance.  Respiratory:  Negative for shortness of breath.   Cardiovascular:  Negative for chest pain.  Gastrointestinal:  Negative for  abdominal pain.  Neurological:  Positive for speech difficulty and headaches.    Physical Exam Updated Vital Signs BP (!) 124/96   Pulse 62   Temp 97.8 F (36.6 C)   Resp 13   Ht 6\' 1"  (1.854 m)   Wt 115.6 kg   SpO2 98%   BMI 33.63 kg/m  Physical Exam Vitals and nursing note reviewed.  Constitutional:      General: He is not in acute distress.    Appearance: Normal appearance. He is well-developed.  HENT:     Head: Normocephalic and atraumatic.  Eyes:     Conjunctiva/sclera: Conjunctivae normal.  Cardiovascular:     Rate and Rhythm: Normal rate and regular rhythm.     Heart sounds: No murmur heard. Pulmonary:     Effort: Pulmonary effort is normal. No respiratory distress.     Breath sounds: Normal breath sounds.  Abdominal:     Palpations: Abdomen is soft.     Tenderness: There is no abdominal tenderness. There is no guarding or rebound.  Musculoskeletal:        General: No swelling.     Cervical back: Neck supple.  Skin:    General: Skin is warm and dry.     Capillary Refill: Capillary refill takes less than 2 seconds.  Neurological:     General: No focal deficit present.     Mental Status: He is alert.     Cranial Nerves: Cranial nerve deficit present.  Sensory: No sensory deficit.     Motor: No weakness.     Comments: Slurred stuttering speech, appears to comprehend     ED Results / Procedures / Treatments   Labs (all labs ordered are listed, but only abnormal results are displayed) Labs Reviewed  COMPREHENSIVE METABOLIC PANEL - Abnormal; Notable for the following components:      Result Value   Glucose, Bld 107 (*)    BUN 21 (*)    Calcium 8.6 (*)    All other components within normal limits  LIPID PANEL - Abnormal; Notable for the following components:   HDL 30 (*)    All other components within normal limits  COMPREHENSIVE METABOLIC PANEL - Abnormal; Notable for the following components:   Potassium 3.4 (*)    Glucose, Bld 111 (*)    BUN 21  (*)    Calcium 8.5 (*)    All other components within normal limits  CBG MONITORING, ED - Abnormal; Notable for the following components:   Glucose-Capillary 108 (*)    All other components within normal limits  ETHANOL  PROTIME-INR  APTT  CBC  DIFFERENTIAL  CBC  MAGNESIUM  PHOSPHORUS  RAPID URINE DRUG SCREEN, HOSP PERFORMED  URINALYSIS, ROUTINE W REFLEX MICROSCOPIC  HEMOGLOBIN A1C  I-STAT CHEM 8, ED    EKG EKG Interpretation Date/Time:  Sunday July 25 2022 20:09:59 EDT Ventricular Rate:  71 PR Interval:  153 QRS Duration:  100 QT Interval:  405 QTC Calculation: 441 R Axis:   -10  Text Interpretation: Sinus rhythm Borderline T wave abnormalities No significant change since prior 6/24 Confirmed by Meridee Score [phone]) on 07/25/2022 8:19:29 PM  Radiology MR BRAIN WO CONTRAST  Result Date: 07/26/2022 CLINICAL DATA:  Neuro deficit, acute, stroke suspected. EXAM: MRI HEAD WITHOUT CONTRAST TECHNIQUE: Multiplanar, multiecho pulse sequences of the brain and surrounding structures were obtained without intravenous contrast. COMPARISON:  Head CT yesterday. FINDINGS: Brain: The brain has a normal appearance without evidence of malformation, atrophy, old or acute small or large vessel infarction, mass lesion, hemorrhage, hydrocephalus or extra-axial collection. Vascular: Major vessels at the base of the brain show flow. Venous sinuses appear patent. Skull and upper cervical spine: Normal. Sinuses/Orbits: Clear/normal. Other: None significant. IMPRESSION: Normal examination. No abnormality seen to explain the clinical presentation. Electronically Signed   By: Paulina Fusi M.D.   On: 07/26/2022 08:20   CT HEAD CODE STROKE WO CONTRAST  Result Date: 07/25/2022 CLINICAL DATA:  Code stroke. Slurred speech, difficulty speaking, frontal headache EXAM: CT HEAD WITHOUT CONTRAST TECHNIQUE: Contiguous axial images were obtained from the base of the skull through the vertex without intravenous  contrast. RADIATION DOSE REDUCTION: This exam was performed according to the departmental dose-optimization program which includes automated exposure control, adjustment of the mA and/or kV according to patient size and/or use of iterative reconstruction technique. COMPARISON:  06/14/2022 FINDINGS: Brain: No evidence of acute infarction, hemorrhage, mass, mass effect, or midline shift. No hydrocephalus or extra-axial collection. Vascular: No hyperdense vessel. Skull: Negative for fracture or focal lesion. Sinuses/Orbits: No acute finding. Other: The mastoid air cells are well aerated. ASPECTS [facility] Stroke Program Early CT Score) - Ganglionic level infarction (caudate, lentiform nuclei, internal capsule, insula, M1-M3 cortex): 7 - Supraganglionic infarction (M4-M6 cortex): 3 Total score (0-10 with 10 being normal): 10 IMPRESSION: No evidence of acute intracranial abnormality. ASPECTS is 10. Code stroke imaging results were communicated on 07/25/2022 at 8:08 pm to provider Izzabella Besse via telephone, who verbally acknowledged these results.  Electronically Signed   By: Wiliam Ke M.D.   On: 07/25/2022 20:09    Procedures .Critical Care  Performed by: Terrilee Files, MD Authorized by: Terrilee Files, MD   Critical care provider statement:    Critical care time (minutes):  45   Critical care time was exclusive of:  Separately billable procedures and treating other patients   Critical care was necessary to treat or prevent imminent or life-threatening deterioration of the following conditions:  CNS failure or compromise   Critical care was time spent personally by me on the following activities:  Development of treatment plan with patient or surrogate, discussions with consultants, evaluation of patient's response to treatment, examination of patient, obtaining history from patient or surrogate, ordering and performing treatments and interventions, ordering and review of laboratory studies, ordering and  review of radiographic studies, pulse oximetry, re-evaluation of patient's condition and review of old charts   I assumed direction of critical care for this patient from another provider in my specialty: no       Medications Ordered in ED Medications  pantoprazole (PROTONIX) EC tablet 40 mg (has no administration in time range)  acetaminophen (TYLENOL) tablet 650 mg (has no administration in time range)    Or  acetaminophen (TYLENOL) suppository 650 mg (has no administration in time range)  ondansetron (ZOFRAN) tablet 4 mg (has no administration in time range)    Or  ondansetron (ZOFRAN) injection 4 mg (has no administration in time range)  prochlorperazine (COMPAZINE) injection 10 mg (10 mg Intravenous Given 07/25/22 2106)  diphenhydrAMINE (BENADRYL) injection 12.5 mg (12.5 mg Intravenous Given 07/25/22 2106)  acetaminophen (TYLENOL) tablet 1,000 mg (1,000 mg Oral Given 07/25/22 2105)    ED Course/ Medical Decision Making/ A&P Clinical Course as of 07/26/22 4098  Wynelle Link Jul 25, 2022  2008 Received call from radiology that the patient has no acute infarct on CT. [MB]  2047 Discussed with Dr. Allena Katz neurology.  He said he offered the patient TNK and patient declined.  He would recommend admission to the hospital for MRI and EEG in the morning.  Recommending Tylenol for headache and try Benadryl Compazine. [MB]  2054 Patient agreeable to stay in the hospital for further neurologic workup.  Have paged hospitalist [MB]    Clinical Course User Index [MB] Terrilee Files, MD                             Medical Decision Making Amount and/or Complexity of Data Reviewed Labs: ordered. Radiology: ordered.  Risk OTC drugs. Prescription drug management. Decision regarding hospitalization.   This patient complains of acute onset of dysarthria possible numbness weakness headache; this involves an extensive number of treatment Options and is a complaint that carries with it a high risk of  complications and morbidity. The differential includes stroke, bleed, seizure, hypoglycemia, intoxication  I ordered, reviewed and interpreted labs, which included CBC normal chemistries mildly low potassium elevated glucose, alcohol negative I ordered medication migraine cocktail and reviewed PMP when indicated. I ordered imaging studies which included head CT and I independently    visualized and interpreted imaging which showed no acute findings Additional history obtained from EMS Previous records obtained and reviewed in epic including prior ED and neurology consult notes I consulted teleneurology Dr. Allena Katz and discussed lab and imaging findings and discussed disposition.  Cardiac monitoring reviewed, normal sinus rhythm Social determinants considered, no significant barriers Critical Interventions: Emergent workup  and evaluation for patient's acute neurologic complaints with consideration for thrombolytics.  TNK ultimately not given.  After the interventions stated above, I reevaluated the patient and found patient still to have dysarthric speech Admission and further testing considered, he would benefit from mission the hospital for further workup including MRI and EEG.  Patient in agreement plan for admission.  Discussed with Dr. Thomes Dinning [facility] hospitalist who will evaluate patient for admission.         Final Clinical Impression(s) / ED Diagnoses Final diagnoses:  Dysarthria  Generalized headache    Rx / DC Orders ED Discharge Orders     None         Terrilee Files, MD 07/26/22 [phone]

## 2022-07-26 ENCOUNTER — Observation Stay (HOSPITAL_COMMUNITY): Payer: 59

## 2022-07-26 ENCOUNTER — Other Ambulatory Visit (HOSPITAL_COMMUNITY): Payer: Self-pay | Admitting: *Deleted

## 2022-07-26 DIAGNOSIS — R479 Unspecified speech disturbances: Secondary | ICD-10-CM | POA: Diagnosis not present

## 2022-07-26 DIAGNOSIS — I6389 Other cerebral infarction: Secondary | ICD-10-CM

## 2022-07-26 DIAGNOSIS — F449 Dissociative and conversion disorder, unspecified: Secondary | ICD-10-CM | POA: Diagnosis not present

## 2022-07-26 DIAGNOSIS — R569 Unspecified convulsions: Secondary | ICD-10-CM | POA: Diagnosis not present

## 2022-07-26 DIAGNOSIS — G43109 Migraine with aura, not intractable, without status migrainosus: Secondary | ICD-10-CM | POA: Diagnosis not present

## 2022-07-26 DIAGNOSIS — R519 Headache, unspecified: Secondary | ICD-10-CM

## 2022-07-26 LAB — COMPREHENSIVE METABOLIC PANEL
ALT: 29 U/L (ref 0–44)
AST: 20 U/L (ref 15–41)
Albumin: 3.7 g/dL (ref 3.5–5.0)
Alkaline Phosphatase: 69 U/L (ref 38–126)
Anion gap: 8 (ref 5–15)
BUN: 21 mg/dL — ABNORMAL HIGH (ref 6–20)
CO2: 23 mmol/L (ref 22–32)
Calcium: 8.5 mg/dL — ABNORMAL LOW (ref 8.9–10.3)
Chloride: 106 mmol/L (ref 98–111)
Creatinine, Ser: 0.99 mg/dL (ref 0.61–1.24)
GFR, Estimated: >60 mL/min (ref >60)
Glucose, Bld: 111 mg/dL — ABNORMAL HIGH (ref 70–99)
Potassium: 3.4 mmol/L — ABNORMAL LOW (ref 3.5–5.1)
Sodium: 137 mmol/L (ref 135–145)
Total Bilirubin: 0.7 mg/dL (ref 0.3–1.2)
Total Protein: 6.5 g/dL (ref 6.5–8.1)

## 2022-07-26 LAB — RAPID URINE DRUG SCREEN, HOSP PERFORMED
Amphetamines: NOT DETECTED
Barbiturates: NOT DETECTED
Benzodiazepines: NOT DETECTED
Cocaine: NOT DETECTED
Opiates: NOT DETECTED
Tetrahydrocannabinol: NOT DETECTED

## 2022-07-26 LAB — PHOSPHORUS: Phosphorus: 3.8 mg/dL (ref 2.5–4.6)

## 2022-07-26 LAB — ECHOCARDIOGRAM LIMITED
Height: 73 in
S' Lateral: 3.8 cm
Weight: 4078.4 oz

## 2022-07-26 LAB — URINALYSIS, ROUTINE W REFLEX MICROSCOPIC
Bilirubin Urine: NEGATIVE
Glucose, UA: NEGATIVE mg/dL
Hgb urine dipstick: NEGATIVE
Ketones, ur: NEGATIVE mg/dL
Leukocytes,Ua: NEGATIVE
Nitrite: NEGATIVE
Protein, ur: NEGATIVE mg/dL
Specific Gravity, Urine: 1.01 (ref 1.005–1.030)
pH: 6 (ref 5.0–8.0)

## 2022-07-26 LAB — LIPID PANEL
Cholesterol: 152 mg/dL (ref 0–200)
HDL: 30 mg/dL — ABNORMAL LOW (ref >40)
LDL Cholesterol: 99 mg/dL (ref 0–99)
Total CHOL/HDL Ratio: 5.1 RATIO
Triglycerides: 116 mg/dL (ref <150)
VLDL: 23 mg/dL (ref 0–40)

## 2022-07-26 LAB — CBC
HCT: 43.9 % (ref 39.0–52.0)
Hemoglobin: 15.3 g/dL (ref 13.0–17.0)
MCH: 30.1 pg (ref 26.0–34.0)
MCHC: 34.9 g/dL (ref 30.0–36.0)
MCV: 86.4 fL (ref 80.0–100.0)
Platelets: 164 10*3/uL (ref 150–400)
RBC: 5.08 MIL/uL (ref 4.22–5.81)
RDW: 12.6 % (ref 11.5–15.5)
WBC: 5 10*3/uL (ref 4.0–10.5)
nRBC: 0 % (ref 0.0–0.2)

## 2022-07-26 LAB — HEMOGLOBIN A1C
Hgb A1c MFr Bld: 5 % (ref 4.8–5.6)
Mean Plasma Glucose: 96.8 mg/dL

## 2022-07-26 LAB — MAGNESIUM: Magnesium: 2.4 mg/dL (ref 1.7–2.4)

## 2022-07-26 MED ORDER — ZONISAMIDE 50 MG PO CAPS: 50.0000 mg | ORAL_CAPSULE | Freq: Every day | ORAL | 0 refills | Status: AC

## 2022-07-26 MED ORDER — ZONISAMIDE 25 MG PO CAPS
50.0000 mg | ORAL_CAPSULE | Freq: Every day | ORAL | Status: DC
  Filled 2022-07-26 (×3): qty 2

## 2022-07-26 MED ORDER — PERFLUTREN LIPID MICROSPHERE
1.0000 mL | INTRAVENOUS | Status: AC | PRN
  Administered 2022-07-26: 3 mL via INTRAVENOUS

## 2022-07-26 NOTE — Evaluation (Signed)
Physical Therapy Evaluation Patient Details Name: Alexander Carroll MRN: [id number] DOB: [birth year] Today's Date: 07/26/2022  History of Present Illness  Alexander Carroll is a 57 y.o. male with medical history significant of GERD, hiatal hernia who presents to the emergency department via EMS due to speech impairment, frontal headache which started about 20-30 minutes PTA.  Patient had similar presentation in February 2024 and this required TNK.  Patient also presented to the ED last month with similar presentation and his symptoms were thought to be functional at that time.  He was seen by neurologist about 11 days ago with plans to obtain an EEG.  Clinical Impression   Pt responded well to treatment and is able to efficiently transfer from bed to chair, as well as ambulate without assistance of AD.  Patient discharged to care of nursing for ambulation daily as tolerated for length of stay.        Assistance Recommended at Discharge None  If plan is discharge home, recommend the following:  Can travel by private vehicle  A little help with walking and/or transfers;A little help with bathing/dressing/bathroom        Equipment Recommendations    Recommendations for Other Services       Functional Status Assessment Patient has had a recent decline in their functional status and demonstrates the ability to make significant improvements in function in a reasonable and predictable amount of time.     Precautions / Restrictions Precautions Precautions: Fall Restrictions Weight Bearing Restrictions: No      Mobility  Bed Mobility Overal bed mobility: Independent                  Transfers Overall transfer level: Independent                      Ambulation/Gait Ambulation/Gait assistance: Independent Gait Distance (Feet): 200 Feet Assistive device: None Gait Pattern/deviations: Decreased stride length, Decreased step length - left, Decreased step length -  right Gait velocity: slowed     General Gait Details: Slowed, short steps  Stairs            Wheelchair Mobility     Tilt Bed    Modified Rankin (Stroke Patients Only)       Balance Overall balance assessment: Independent                                           Pertinent Vitals/Pain      Home Living Family/patient expects to be discharged to:: Private residence Living Arrangements: Spouse/significant other Available Help at Discharge: Family;Available 24 hours/day Type of Home: Other(Comment) Home Access: Level entry       Home Layout: One level Home Equipment: None      Prior Function Prior Level of Function : Independent/Modified Independent                     Hand Dominance        Extremity/Trunk Assessment                Communication      Cognition  General Comments      Exercises     Assessment/Plan    PT Assessment All further PT needs can be met in the next venue of care  PT Problem List Decreased strength;Decreased mobility;Decreased activity tolerance;Decreased balance       PT Treatment Interventions      PT Goals (Current goals can be found in the Care Plan section)  Acute Rehab PT Goals Patient Stated Goal: Return home with assist from family PT Goal Formulation: With patient Time For Goal Achievement: 08/09/22 Potential to Achieve Goals: Good    Frequency       Co-evaluation               AM-PAC PT "6 Clicks" Mobility  Outcome Measure Help needed turning from your back to your side while in a flat bed without using bedrails?: A Little Help needed moving from lying on your back to sitting on the side of a flat bed without using bedrails?: A Little Help needed moving to and from a bed to a chair (including a wheelchair)?: A Little Help needed standing up from a chair using your arms (e.g., wheelchair or  bedside chair)?: A Little Help needed to walk in hospital room?: A Little Help needed climbing 3-5 steps with a railing? : A Lot 6 Click Score: 17    End of Session     Patient left: in chair;with call bell/phone within reach   PT Visit Diagnosis: Muscle weakness (generalized) (M62.81);Unsteadiness on feet (R26.81);Other abnormalities of gait and mobility (R26.89)    Time: 0940-1005 PT Time Calculation (min) (ACUTE ONLY): 25 min   Charges:   PT Evaluation $PT Eval Moderate Complexity: 1 Mod PT Treatments $Therapeutic Activity: 23-37 mins PT General Charges $$ ACUTE PT VISIT: 1 Visit        Arseniy Toomey SPT [place], DPT Program

## 2022-07-26 NOTE — Progress Notes (Signed)
1949 call time 2000 exam started 2002 exam finished 2002 images sent to soc 2004 exam completed in epic 2005 [facility] called.

## 2022-07-26 NOTE — Consult Note (Signed)
I connected with  [name] on 07/26/22 by a video enabled telemedicine application and verified that I am speaking with the correct person using two identifiers.   I discussed the limitations of evaluation and management by telemedicine. The patient expressed understanding and agreed to proceed.  Location of patient: [facility] Location of physician: [facility]   Neurology Consultation Reason for Consult: Difficulty speaking, left-sided tingling, numbness, headache Referring Physician: Dr Westley Foots  CC: Difficulty speaking, left-sided tingling, numbness, headache  History is obtained from: Patient, chart review  HPI: Alexander Carroll is a 57 y.o. male with past medical history of hypertension and gastroesophageal reflux disease who presented with transient speech disturbance.  Patient states yesterday evening he was sitting on his couch and suddenly noticed holocephalic headache as well as speech disturbance lasted for few hours and then completely resolved.  Denies any weakness, tingling, numbness, vision changes, dizziness with this episode.  Of note patient has had 2 presentations with similar symptoms in February 2024 and June 2024.  At that time, symptoms were attributed to conversion disorder.  He also saw Dr. Epimenio Foot in clinic and an EEG was recommended.  Patient denies history of epilepsy risk factors.  Denies any recent medications, over-the-counter drugs, illicit substance abuse, recent illness  ROS: All other systems reviewed and negative except as noted in the HPI.   Past Medical History:  Diagnosis Date   GERD (gastroesophageal reflux disease)    Hiatal hernia    SMALL    Family History  Problem Relation Age of Onset   Kidney failure Mother    Leukemia Father    Colon cancer Neg Hx    Gastric cancer Neg Hx    Esophageal cancer Neg Hx     Social History:  reports that he has never smoked. He has never used smokeless tobacco. He reports that he  does not currently use alcohol. He reports that he does not use drugs.   Medications Prior to Admission  Medication Sig Dispense Refill Last Dose   acetaminophen (TYLENOL) 500 MG tablet Take 1,000 mg by mouth as needed for mild pain, moderate pain or headache.   Past Week   Calcium Carbonate Antacid (TUMS PO) Take 1-2 tablets by mouth as needed (heartburn).   unknown      Exam: Current vital signs: BP (!) 124/96   Pulse 62   Temp 97.8 F (36.6 C)   Resp 13   Ht 6\' 1"  (1.854 m)   Wt 115.6 kg   SpO2 98%   BMI 33.63 kg/m  Vital signs in last 24 hours: Temp:  [97.7 F (36.5 C)-98.7 F (37.1 C)] 97.8 F (36.6 C) (07/15 0300) Pulse Rate:  [62-75] 62 (07/15 0300) Resp:  [13-18] 13 (07/15 0300) BP: (124-160)/(92-99) 124/96 (07/15 0300) SpO2:  [95 %-98 %] 98 % (07/15 0300) Weight:  [115.6 kg-119.2 kg] 115.6 kg (07/14 2241)   Physical Exam  Constitutional: Appears well-developed and well-nourished.  Psych: Affect appropriate to situation Neuro: AOx3, no aphasia, cranial nerves II to XII appear grossly intact, antigravity strength without drift in all 4 extremities, sensation intact to light touch, FTN intact bilaterally  I have reviewed labs in epic and the results pertinent to this consultation are: CBC:  Recent Labs  Lab 07/25/22 2011 07/26/22 0447  WBC 6.5 5.0  NEUTROABS 4.6  --   HGB 15.7 15.3  HCT 44.2 43.9  MCV 84.4 86.4  PLT 175 164    Basic Metabolic Panel:  Lab Results  Component Value Date   NA 137 07/26/2022   K 3.4 (L) 07/26/2022   CO2 23 07/26/2022   GLUCOSE 111 (H) 07/26/2022   BUN 21 (H) 07/26/2022   CREATININE 0.99 07/26/2022   CALCIUM 8.5 (L) 07/26/2022   GFRNONAA >60 07/26/2022   GFRAA >60 08/17/2018   Lipid Panel:  Lab Results  Component Value Date   LDLCALC 99 07/26/2022   HgbA1c:  Lab Results  Component Value Date   HGBA1C 4.8 02/26/2022   Urine Drug Screen:     Component Value Date/Time   LABOPIA NONE DETECTED 02/27/2022 0111    COCAINSCRNUR NONE DETECTED 02/27/2022 0111   LABBENZ NONE DETECTED 02/27/2022 0111   AMPHETMU NONE DETECTED 02/27/2022 0111   THCU NONE DETECTED 02/27/2022 0111   LABBARB NONE DETECTED 02/27/2022 0111    Alcohol Level     Component Value Date/Time   ETH <10 07/25/2022 2011     I have reviewed the images obtained:  MRI Brain without contrast 07/26/2022: Normal examination. No abnormality seen to explain the clinical presentation.  US carotid bilateral 07/26/2022: Minor carotid atherosclerosis. Negative for significant stenosis.Degree of narrowing less than 50% bilaterally by ultrasound criteria. Patent antegrade vertebral flow bilaterally.  CTA head and neck 02/25/2022:  No intracranial large vessel occlusion or significant stenosis.  No hemodynamically significant stenosis in the neck.   ASSESSMENT/PLAN: 57 year old male with third presentation this year with transient speech disturbance and.  With the previous 2 episodes also had some left-sided weakness.  Transient speech disturbance -Differentials include migraine with aura versus less likely seizures versus conversion disorder  Recommendations: -Dr. Epimenio Foot was planning to obtain routine EEG as outpatient.  Will go ahead and obtain at while he is in the hospital -Due to third presentation, discussed about starting patient on zonisamide 100 mg daily as it can help with headaches as well as seizures although low suspicion that these episodes are epileptic -If routine EEG is within normal limits, okay to discharge from neurology standpoint and follow-up with Dr. Faustino Congress -Discussed plan with Dr. Jarvis Newcomer via secure chat   Thank you for allowing [place] to participate in the care of this patient. If you have any further questions, please contact  me or neurohospitalist.   Lindie Spruce Epilepsy [facility]

## 2022-07-26 NOTE — Progress Notes (Addendum)
  PT recommending outpatient PT, orders placed for ambulatory PT in [place].     07/26/22 1502  TOC Brief Assessment  Insurance and Status Reviewed  Patient has primary care physician Yes  Home environment has been reviewed home with spouse  Prior level of function: independent  Prior/Current Home Services No current home services  Social Determinants of Health Reivew SDOH reviewed no interventions necessary  Readmission risk has been reviewed Yes  Transition of care needs no transition of care needs at this time   Transition of Care Department [facility]) has reviewed patient and no TOC needs have been identified at this time. We will continue to monitor patient advancement through interdisciplinary progression rounds. If new patient transition needs arise, please place a TOC consult.

## 2022-07-26 NOTE — Progress Notes (Signed)
*  PRELIMINARY RESULTS* Echocardiogram Limited 2-D Echocardiogram  has been performed with [facility].  Stacey Drain 07/26/2022, 3:47 PM

## 2022-07-26 NOTE — Discharge Summary (Signed)
Physician Discharge Summary   Patient: Alexander Carroll MRN: [id number] DOB: July 05, 1965  Admit date:     07/25/2022  Discharge date: 07/26/22  Discharge Physician: Tyrone Nine   PCP: Assunta Found, MD   Recommendations at discharge:  Follow up with neurology, Dr. Epimenio Foot, after discharge.   Discharge Diagnoses: Principal Problem:   Cerebrovascular accident (CVA) (HCC) Active Problems:   GERD (gastroesophageal reflux disease)   Headache   Obesity (BMI 30-39.9)  HPI: Alexander Carroll is a 57 y.o. male with medical history significant of GERD, hiatal hernia who presents to the emergency department via EMS due to speech impairment, frontal headache which started about 20-30 minutes PTA.  Patient had similar presentation in February 2024 and this required TNK.  Patient also presented to the ED last month with similar presentation and his symptoms were thought to be functional at that time.  He was seen by neurologist about 11 days ago with plans to obtain an EEG.   ED Course:  In the emergency department, BP was 160/92, but other vital signs were within normal range.  Workup in the ED showed normal CBC and BMP except for blood glucose of 107 and BUN of 21.  Alcohol level was less than 10. CT head without contrast showed no evidence of acute intracranial abnormality Telemetry neurology was consulted, thrombolytics was suggested, but patient refused and recommendation was to admit patient for workup for complex headache vs seizure vs stroke vs conversion disorder. Patient was treated with Tylenol, Compazine and Benadryl. Hospitalist was asked to admit patient for further evaluation and management.  Hospital Course: MRI brain showed no acute findings. EEG was planned as an outpatient. REcommended this be done while inpatient but was delayed and patient opted to leave prior to it being completed. Neurology's recommendation is to discharge on lacosamide 50mg  and follow up with neurology. Stroke has been  ruled out, though differential diagnosis continues to include migraine with aura, seizure, and conversion disorder. He is discharged after EEG finalized interpretation in stable condition.  Assessment and Plan: Slurred speech: Third presentation for similar symptoms this year with transient speech disturbance and prior 2 episodes had left sided weakness: Has ruled out for stroke. Seizure disorder, complex migraine and conversion disorder remain in differential. CT head without contrast showed no acute intracranial abnormality. Bilateral carotid ultrasound show no hemodynamically significant stenosis bilaterally. MRI brain read by radiologist as "normal examination."  - EEG 7/15 was not performed prior to patient leaving the hospital. - Discussed with neurology, Dr. Melynda Ripple, on day of discharge. Recommendation to me is zonisamide 50mg  daily and follow up with Dr. Epimenio Foot.  - Outpatient PT referral in place   Complex headache: Tylenol, Benadryl and Compazine given. Improved.   GERD: Continue prn's   Obesity: Body mass index is 33.63 kg/m.   Consultants: Neurology Procedures performed: Echo  Disposition: Home Diet recommendation:  Cardiac and Carb modified diet DISCHARGE MEDICATION: Allergies as of 07/26/2022       Reactions   Penicillins Hives, Itching   Has patient had a PCN reaction causing immediate rash, facial/tongue/throat swelling, SOB or lightheadedness with hypotension: Unknown Has patient had a PCN reaction causing severe rash involving mucus membranes or skin necrosis: Unknown Has patient had a PCN reaction that required hospitalization: Unknown Has patient had a PCN reaction occurring within the last 10 years: No Childhood reaction. If all of the above answers are "NO", then may proceed with Cephalosporin use.      Discharge Exam: [employer]  07/25/22 2013 07/25/22 2241  Weight: 119.2 kg 115.6 kg  BP 134/83 (BP Location: Left Arm)   Pulse 68   Temp 97.6 F (36.4 C)  (Oral)   Resp 13   Ht 6\' 1"  (1.854 m)   Wt 115.6 kg   SpO2 97%   BMI 33.63 kg/m   56yo M laying in bed in no distress Clear, nonlabored RRR, no MRG or pitting edema Alert, oriented, no focal deficits on limited exam.   Condition at discharge: stable  The results of significant diagnostics from this hospitalization (including imaging, microbiology, ancillary and laboratory) are listed below for reference.   Imaging Studies: US Carotid Bilateral  Result Date: 07/26/2022 CLINICAL DATA:  Dysarthria, stroke symptoms EXAM: BILATERAL CAROTID DUPLEX ULTRASOUND TECHNIQUE: Wallace Cullens scale imaging, color Doppler and duplex ultrasound were performed of bilateral carotid and vertebral arteries in the neck. COMPARISON:  None Available. FINDINGS: Criteria: Quantification of carotid stenosis is based on velocity parameters that correlate the residual internal carotid diameter with NASCET-based stenosis levels, using the diameter of the distal internal carotid lumen as the denominator for stenosis measurement. The following velocity measurements were obtained: RIGHT ICA: 107/45 cm/sec CCA: 91/23 cm/sec SYSTOLIC ICA/CCA RATIO:  1.2 ECA: 84 cm/sec LEFT ICA: 106/44 cm/sec CCA: 100/30 cm/sec SYSTOLIC ICA/CCA RATIO:  1.1 ECA: 105 cm/sec RIGHT CAROTID ARTERY: Trace thin intimal thickening and hypoechoic atherosclerosis. Negative for stenosis, velocity elevation, or turbulent flow. Degree of narrowing less than 50% by ultrasound criteria. RIGHT VERTEBRAL ARTERY:  Normal antegrade flow LEFT CAROTID ARTERY: similar trace thin intimal thickening/minimal hypoechoic atherosclerosis. Negative for stenosis, velocity elevation, or turbulent flow. Degree of narrowing also less than 50% by ultrasound criteria. LEFT VERTEBRAL ARTERY:  Normal antegrade flow IMPRESSION: 1. Minor carotid atherosclerosis. Negative for significant stenosis. Degree of narrowing less than 50% bilaterally by ultrasound criteria. 2. Patent antegrade vertebral  flow bilaterally. Electronically Signed   By: Judie Petit.  Shick M.D.   On: 07/26/2022 10:10   MR BRAIN WO CONTRAST  Result Date: 07/26/2022 CLINICAL DATA:  Neuro deficit, acute, stroke suspected. EXAM: MRI HEAD WITHOUT CONTRAST TECHNIQUE: Multiplanar, multiecho pulse sequences of the brain and surrounding structures were obtained without intravenous contrast. COMPARISON:  Head CT yesterday. FINDINGS: Brain: The brain has a normal appearance without evidence of malformation, atrophy, old or acute small or large vessel infarction, mass lesion, hemorrhage, hydrocephalus or extra-axial collection. Vascular: Major vessels at the base of the brain show flow. Venous sinuses appear patent. Skull and upper cervical spine: Normal. Sinuses/Orbits: Clear/normal. Other: None significant. IMPRESSION: Normal examination. No abnormality seen to explain the clinical presentation. Electronically Signed   By: Paulina Fusi M.D.   On: 07/26/2022 08:20   CT HEAD CODE STROKE WO CONTRAST  Result Date: 07/25/2022 CLINICAL DATA:  Code stroke. Slurred speech, difficulty speaking, frontal headache EXAM: CT HEAD WITHOUT CONTRAST TECHNIQUE: Contiguous axial images were obtained from the base of the skull through the vertex without intravenous contrast. RADIATION DOSE REDUCTION: This exam was performed according to the departmental dose-optimization program which includes automated exposure control, adjustment of the mA and/or kV according to patient size and/or use of iterative reconstruction technique. COMPARISON:  06/14/2022 FINDINGS: Brain: No evidence of acute infarction, hemorrhage, mass, mass effect, or midline shift. No hydrocephalus or extra-axial collection. Vascular: No hyperdense vessel. Skull: Negative for fracture or focal lesion. Sinuses/Orbits: No acute finding. Other: The mastoid air cells are well aerated. ASPECTS [facility] Stroke Program Early CT Score) - Ganglionic level infarction (caudate, lentiform nuclei, internal capsule,  insula, M1-M3 cortex): 7 - Supraganglionic infarction (M4-M6 cortex): 3 Total score (0-10 with 10 being normal): 10 IMPRESSION: No evidence of acute intracranial abnormality. ASPECTS is 10. Code stroke imaging results were communicated on 07/25/2022 at 8:08 pm to provider BUTLER via telephone, who verbally acknowledged these results. Electronically Signed   By: Wiliam Ke M.D.   On: 07/25/2022 20:09    Microbiology: Results for orders placed or performed during the hospital encounter of 02/25/22  MRSA Next Gen by PCR, Nasal     Status: None   Collection Time: 02/26/22 12:58 AM   Specimen: Nasal Mucosa; Nasal Swab  Result Value Ref Range Status   MRSA by PCR Next Gen NOT DETECTED NOT DETECTED Final    Comment: (NOTE) The GeneXpert MRSA Assay (FDA approved for NASAL specimens only), is one component of a comprehensive MRSA colonization surveillance program. It is not intended to diagnose MRSA infection nor to guide or monitor treatment for MRSA infections. Test performance is not FDA approved in patients less than 69 years old. Performed at [facility], [address] [place], [place] [address]     Labs: CBC: Recent Labs  Lab 07/25/22 2011 07/26/22 0447  WBC 6.5 5.0  NEUTROABS 4.6  --   HGB 15.7 15.3  HCT 44.2 43.9  MCV 84.4 86.4  PLT 175 164   Basic Metabolic Panel: Recent Labs  Lab 07/25/22 2011 07/26/22 0447  NA 137 137  K 3.6 3.4*  CL 104 106  CO2 24 23  GLUCOSE 107* 111*  BUN 21* 21*  CREATININE 1.12 0.99  CALCIUM 8.6* 8.5*  MG  --  2.4  PHOS  --  3.8   Liver Function Tests: Recent Labs  Lab 07/25/22 2011 07/26/22 0447  AST 25 20  ALT 34 29  ALKPHOS 75 69  BILITOT 0.7 0.7  PROT 7.1 6.5  ALBUMIN 4.1 3.7   CBG: Recent Labs  Lab 07/25/22 2009  GLUCAP 108*    Discharge time spent: greater than 30 minutes.  Signed: Tyrone Nine, MD [facility] 07/26/2022

## 2022-07-26 NOTE — Progress Notes (Signed)
OT Cancellation Note  Patient Details Name: Alexander Carroll MRN: [id number] DOB: June 23, 1965   Cancelled Treatment:    Reason Eval/Treat Not Completed: Patient at procedure or test/ unavailable. Pt not present in the room at time of attempted evaluation. Will attempt later as time permits.   Shia Leontae Bostock OT, MOT   Kristine Tiley 07/26/2022, 8:35 AM

## 2022-08-03 ENCOUNTER — Ambulatory Visit: Payer: 59 | Admitting: Neurology

## 2022-08-03 DIAGNOSIS — R4182 Altered mental status, unspecified: Secondary | ICD-10-CM | POA: Diagnosis not present

## 2022-08-03 DIAGNOSIS — R404 Transient alteration of awareness: Secondary | ICD-10-CM

## 2022-08-05 ENCOUNTER — Encounter: Payer: Self-pay | Admitting: Neurology

## 2022-08-05 NOTE — Progress Notes (Signed)
   [facility]  EEG (ELECTROENCEPHALOGRAM) REPORT   STUDY DATE: 08/03/2022   PATIENT NAME: Alexander Carroll DOB: 02-Dec-1965 MRN: [id number]  ORDERING CLINICIAN: Nahomy Limburg A. Epimenio Foot, MD. PhD  TECHNOLOGIST: Marianne Sofia, REEGT TECHNIQUE: Electroencephalogram was recorded utilizing standard 10-20 system of lead placement and reformatted into average and bipolar montages.  RECORDING TIME: 25 minutes 29 seconds  CLINICAL INFORMATION: 57 year old man with episodes of slurred speech and altered awareness  FINDINGS: A digital EEG was performed while the patient was awake and drowsy. While awake and most alert there was a 10 hz posterior dominant rhythm. Voltages and frequencies were symmetric.  There were no focal, lateralizing, epileptiform activity or seizures seen.  Photic stimulation had a normal driving response. Hyperventilation and recovery did not change the underlying rhythms. EKG channel shows normal sinus rhythm.  No drowsiness or sleep was recorded.  IMPRESSION: This is a normal EEG while the patient was awake.   INTERPRETING PHYSICIAN:   Willowdean Luhmann A. Epimenio Foot, MD, PhD, [facility] Certified in Neurology, Clinical Neurophysiology, Sleep Medicine, Pain Medicine and Neuroimaging  [facility] [address] [place], [place] [address] [phone]

## 2022-12-23 ENCOUNTER — Encounter (HOSPITAL_BASED_OUTPATIENT_CLINIC_OR_DEPARTMENT_OTHER): Payer: Self-pay | Admitting: Emergency Medicine

## 2022-12-23 ENCOUNTER — Emergency Department (HOSPITAL_BASED_OUTPATIENT_CLINIC_OR_DEPARTMENT_OTHER): Payer: 59

## 2022-12-23 ENCOUNTER — Emergency Department (HOSPITAL_BASED_OUTPATIENT_CLINIC_OR_DEPARTMENT_OTHER)
Admission: EM | Admit: 2022-12-23 | Discharge: 2022-12-23 | Disposition: A | Discharge status: Home / Self Care | Payer: 59 | Attending: Emergency Medicine | Admitting: Emergency Medicine

## 2022-12-23 ENCOUNTER — Other Ambulatory Visit: Payer: Self-pay

## 2022-12-23 DIAGNOSIS — R499 Unspecified voice and resonance disorder: Secondary | ICD-10-CM | POA: Diagnosis present

## 2022-12-23 DIAGNOSIS — R490 Dysphonia: Secondary | ICD-10-CM

## 2022-12-23 LAB — BASIC METABOLIC PANEL
Anion gap: 9 (ref 5–15)
BUN: 20 mg/dL (ref 6–20)
CO2: 27 mmol/L (ref 22–32)
Calcium: 9.3 mg/dL (ref 8.9–10.3)
Chloride: 105 mmol/L (ref 98–111)
Creatinine, Ser: 0.96 mg/dL (ref 0.61–1.24)
GFR, Estimated: >60 mL/min (ref >60)
Glucose, Bld: 95 mg/dL (ref 70–99)
Potassium: 4.1 mmol/L (ref 3.5–5.1)
Sodium: 141 mmol/L (ref 135–145)

## 2022-12-23 LAB — URINALYSIS, ROUTINE W REFLEX MICROSCOPIC
Bilirubin Urine: NEGATIVE
Glucose, UA: NEGATIVE mg/dL
Hgb urine dipstick: NEGATIVE
Ketones, ur: NEGATIVE mg/dL
Leukocytes,Ua: NEGATIVE
Nitrite: NEGATIVE
Specific Gravity, Urine: 1.033 — ABNORMAL HIGH (ref 1.005–1.030)
pH: 5.5 (ref 5.0–8.0)

## 2022-12-23 LAB — CBC
HCT: 46.5 % (ref 39.0–52.0)
Hemoglobin: 16.4 g/dL (ref 13.0–17.0)
MCH: 29.6 pg (ref 26.0–34.0)
MCHC: 35.3 g/dL (ref 30.0–36.0)
MCV: 83.9 fL (ref 80.0–100.0)
Platelets: 198 10*3/uL (ref 150–400)
RBC: 5.54 MIL/uL (ref 4.22–5.81)
RDW: 12.7 % (ref 11.5–15.5)
WBC: 6.6 10*3/uL (ref 4.0–10.5)
nRBC: 0 % (ref 0.0–0.2)

## 2022-12-23 MED ORDER — IOHEXOL 300 MG/ML  SOLN
100.0000 mL | Freq: Once | INTRAMUSCULAR | Status: AC | PRN
  Administered 2022-12-23: 75 mL via INTRAVENOUS

## 2022-12-23 NOTE — Discharge Instructions (Signed)
You were seen for your voice changes in the emergency department.   At home, please stay well hydrated.    Check your MyChart online for the results of any tests that had not resulted by the time you left the emergency department.   Follow-up with your primary doctor in 2-3 days regarding your visit.  Follow-up with ENT as soon as possible.   Return immediately to the emergency department if you experience any of the following: difficulty breathing, or any other concerning symptoms.    Thank you for visiting our Emergency Department. It was a pleasure taking care of you today.

## 2022-12-23 NOTE — ED Provider Notes (Signed)
[place] EMERGENCY DEPARTMENT AT [facility] Provider Note   CSN: [id number] Arrival date & time: 12/23/22  1753     History  Chief Complaint  Patient presents with   Fatigue    Alexander Carroll is a 57 y.o. male.  57 year old male with a history of GERD, hiatal hernia status post Nissen fundoplication, and speech changes who presents to the emergency department with voice changes.  Patient has had 3 episodes this year where his voice will change for several days at a time.  Presented to the emergency department twice and was hospitalized.  1 time received TNK for suspected stroke but had a negative MRI and evaluation otherwise.  Had a second MRI when he presented with similar symptoms that was also normal.  Is awaiting follow-up with ENT at this time.  Since Sunday has been having higher pitched voice.  No difficulty swallowing.  No shortness of breath or neck pain.        Home Medications Prior to Admission medications   Medication Sig Start Date End Date Taking? Authorizing Provider  acetaminophen (TYLENOL) 500 MG tablet Take 1,000 mg by mouth as needed for mild pain, moderate pain or headache.    [provider]  Calcium Carbonate Antacid (TUMS PO) Take 1-2 tablets by mouth as needed (heartburn).    [provider]  zonisamide (ZONEGRAN) 50 MG capsule Take 1 capsule (50 mg total) by mouth daily. 07/26/22   Tyrone Nine, MD      Allergies    Penicillins    Review of Systems   Review of Systems  Physical Exam Updated Vital Signs BP (!) 151/96 (BP Location: Right Arm)   Pulse 70   Temp 98 F (36.7 C) (Oral)   Resp 18   Wt 116.7 kg   SpO2 99%   BMI 33.94 kg/m  Physical Exam Vitals and nursing note reviewed.  Constitutional:      General: He is not in acute distress.    Appearance: He is well-developed.     Comments: Does have high-pitched voice.  HENT:     Head: Normocephalic and atraumatic.     Right Ear: External ear normal.      Left Ear: External ear normal.     Nose: Nose normal.     Mouth/Throat:     Mouth: Mucous membranes are moist.     Pharynx: Oropharynx is clear.     Comments: Mallampati of 4.  Unable to visualize uvula or posterior oropharynx. Eyes:     Extraocular Movements: Extraocular movements intact.     Conjunctiva/sclera: Conjunctivae normal.     Pupils: Pupils are equal, round, and reactive to light.  Cardiovascular:     Rate and Rhythm: Normal rate and regular rhythm.     Heart sounds: Normal heart sounds.  Pulmonary:     Effort: Pulmonary effort is normal. No respiratory distress.     Breath sounds: Normal breath sounds. No stridor.  Musculoskeletal:     Cervical back: Normal range of motion and neck supple.     Right lower leg: No edema.     Left lower leg: No edema.  Skin:    General: Skin is warm and dry.  Neurological:     Mental Status: He is alert. Mental status is at baseline.  Psychiatric:        Mood and Affect: Mood normal.        Behavior: Behavior normal.     ED Results / Procedures /  Treatments   Labs (all labs ordered are listed, but only abnormal results are displayed) Labs Reviewed  URINALYSIS, ROUTINE W REFLEX MICROSCOPIC - Abnormal; Notable for the following components:      Result Value   Specific Gravity, Urine 1.033 (*)    Protein, ur TRACE (*)    All other components within normal limits  BASIC METABOLIC PANEL  CBC    EKG EKG Interpretation Date/Time:  Thursday December 23 2022 18:49:13 EST Ventricular Rate:  73 PR Interval:  162 QRS Duration:  82 QT Interval:  402 QTC Calculation: 442 R Axis:   -17  Text Interpretation: Normal sinus rhythm Cannot rule out Anterior infarct , age undetermined Abnormal ECG Confirmed by Vonita Moss [phone]) on 12/23/2022 7:48:08 PM  Radiology CT Soft Tissue Neck W Contrast Result Date: 12/23/2022 CLINICAL DATA:  Voice change. Episodes of confusion. Decreased appetite. Dizzy spell. EXAM: CT NECK WITH CONTRAST  TECHNIQUE: Multidetector CT imaging of the neck was performed using the standard protocol following the bolus administration of intravenous contrast. RADIATION DOSE REDUCTION: This exam was performed according to the departmental dose-optimization program which includes automated exposure control, adjustment of the mA and/or kV according to patient size and/or use of iterative reconstruction technique. CONTRAST:  75mL OMNIPAQUE IOHEXOL 300 MG/ML  SOLN COMPARISON:  CT angio head and neck 02/25/2022 FINDINGS: Pharynx and larynx: No focal mucosal or submucosal lesions are present. The nasopharynx is clear. The soft palate and tongue base are within normal limits. The oropharynx is unremarkable. Vallecula and epiglottis are within normal limits. Aryepiglottic folds and piriform sinuses are clear. Vocal cords are midline and symmetric. Trachea is clear. Salivary glands: The submandibular and parotid glands and ducts are within normal limits. Thyroid: Normal Lymph nodes: No significant adenopathy is present. Vascular: No significant vascular disease is present. Limited intracranial: Within normal limits. Visualized orbits: The globes and orbits are within normal limits. Mastoids and visualized paranasal sinuses: The paranasal sinuses and mastoid air cells are clear. Skeleton: Mild degenerative changes are present at C6-7. Vertebral body heights and alignment are otherwise normal. The patient is edentulous. Upper chest: The lung apices are clear. The thoracic inlet is within normal limits. IMPRESSION: 1. Normal CT appearance of the neck. No acute or focal lesion to explain the patient's symptoms. 2. Mild degenerative changes at C6-7. Electronically Signed   By: Marin Roberts M.D.   On: 12/23/2022 22:05    Procedures Procedures    Medications Ordered in ED Medications  iohexol (OMNIPAQUE) 300 MG/ML solution 100 mL (75 mLs Intravenous Contrast Given 12/23/22 2145)    ED Course/ Medical Decision Making/  A&P                                 Medical Decision Making Amount and/or Complexity of Data Reviewed Labs: ordered. Radiology: ordered.  Risk Prescription drug management.   Alexander Carroll is a 57 y.o. male with comorbidities that complicate the patient evaluation including GERD, hiatal hernia status post Nissen fundoplication, and speech changes who presents to the emergency department with voice changes.    Initial Ddx:  Functional dysphonia, upper airway obstruction, stroke, conversion disorder  MDM/Course:  Patient presents to the emergency department with changes in voice.  It appears to be more high-pitched than normal at this time.  Has already had 2 hospitalizations with negative MRIs and otherwise reassuring workups.  Is awaiting to see ENT at this time.  Has limited  mouth opening and not able to visualize the posterior oropharynx very well.  Not complaining of any other neurologic symptoms at this time so feel that stroke or any other sort of acute neurologic disorder is less likely.  Did obtain a CT of the neck at this time since he has not had dedicated imaging to rule out any mass there.  Did not show any evidence of mass or other acute abnormality.  Unclear exactly what is causing his symptoms but will have him follow-up with ENT as scheduled.   This patient presents to the ED for concern of complaints listed in HPI, this involves an extensive number of treatment options, and is a complaint that carries with it a high risk of complications and morbidity. Disposition including potential need for admission considered.   Dispo: DC Home. Return precautions discussed including, but not limited to, those listed in the AVS. Allowed pt time to ask questions which were answered fully prior to dc.  Additional history obtained from spouse Records reviewed Admission Notes and DC Summary The following labs were independently interpreted: Chemistry and show no acute abnormality I  independently reviewed the following imaging with scope of interpretation limited to determining acute life threatening conditions related to emergency care:  CT neck  and agree with the radiologist interpretation with the following exceptions: none I personally reviewed and interpreted cardiac monitoring: normal sinus rhythm  I personally reviewed and interpreted the pt's EKG: see above for interpretation  I have reviewed the patients home medications and made adjustments as needed  Portions of this note were generated with Dragon dictation software. Dictation errors may occur despite best attempts at proofreading.     Final Clinical Impression(s) / ED Diagnoses Final diagnoses:  Dysphonia    Rx / DC Orders ED Discharge Orders     None         Rondel Baton, MD 12/24/22 1521

## 2022-12-23 NOTE — ED Triage Notes (Signed)
On Sunday, pts voice suddenly was higher than normal (usually happens when he has flu like symptoms, but hasn't had any), wife states he also has been lethargic/malaise/confused at times. No appetite

## 2022-12-23 NOTE — ED Triage Notes (Signed)
Sunday also felt dizzy while in shower and needed help to get out.

## 2023-01-07 NOTE — Progress Notes (Signed)
 Otolaryngology New Patient Note  Subjective: [name] is a 57 y.o. male who presents for evaluation of dysphonia. Symptoms will occur abruptly without obvious trigger and resolve spontaneously after several weeks. He reports 3 such episodes over the past 7-8 months. When present (as they are at this appointment), his voice becomes high-pitched(almost falsetto) and severely strained. He struggles with vocal projection and fatigue. He also reports breaks/cracks as well as difficulty with pitch changes. No difficulty breathing or increased work of breathing.  He denies any similar symptoms in the past. Denies traumatic events or PTSD. Denies injury to the neck or previous neck surgery.  He has a history of severe GERD, which is poorly controlled with maximal medical therapy. He has a hiatal hernia, which was previously repaired with partial success. Denies throat clearing. Denies globus sensation.  Nonsmoker.   Past Medical History:  Diagnosis Date  . GERD (gastroesophageal reflux disease)   . Thyroid disease    Past Surgical History:  Procedure Laterality Date  . LAPAROSCOPIC NISSEN FUNDOPLICATION N/A 01/09/2019   Procedure: NISSEN FUNDOPLICATION LAPAROSCOPIC;  Surgeon: Tinnie Comer Sing, MD;  Location[facility] MAIN OR;  Service: General;  Laterality: N/A;  . NO PAST SURGERIES     Procedure: NO PAST SURGERIES  . PARAESOPHAGEAL HERNIA REPAIR N/A 01/09/2019   Procedure: PARAESOPHAGEAL HERNIA REPAIR LAPAROSCOPIC;  Surgeon: Tinnie Comer Sing, MD;  Location: [facility] MAIN OR;  Service: General;  Laterality: N/A;   Family History  Problem Relation Name Age of Onset  . Kidney disease Mother    . Leukemia Father    . Clotting disorder Neg Hx    . Anesthesia problems Neg Hx       Allergies  Allergen Reactions  . Penicillin Rash     ROS A complete review of systems was conducted and was negative except as stated in the HPI.   Objective: Vitals:   01/07/23 1205   BP: (!) 178/107  Pulse: 71  Temp: 97.3 F (36.3 C)   Physical Exam:   General Alexander Carroll/AT. No suspicious cutaneous lesions or ulcerations. No tenderness to palpation over the frontal or maxillary sinuses.  Awake, Alert and appropriate for the exam  Eyes PERRL, no scleral icterus or conjunctival hemorrhage.  EOMI.  Ears Right ear- EAC patent, no obstructing cerumen. TM: intact, no effusion, no retraction, normal landmarks Left ear- EAC patent, no obstructing cerumen. TM: intact, no effusion, no retraction, normal landmarks   Nose No external nasal deformities. Septum straight. Nasal mucosa moist. No significant turbinate hypertrophy. No masses or polyps.   Oral Pharynx Mucosa moist and clear. No masses, lesions, or ulceration noted on the lips, alveolar ridges, buccal mucosa, floor of mouth, tongue, soft/hard palate, tonsils, or posterior pharynx. Soft FOM. Mobile tongue.  Neck/Lymphatics Soft and supple without LAD. Palpation of the salivary glands demonstrates no masses or irregularity.  Endocrine Thyroid normal to palpation without dominant nodule or irregularity.  Cardio-vascular No cyanosis. Regular rate and rhythm.  Pulmonary Quiet respirations with symmetric chest wall movement. Lungs are clear to auscultation bilaterally   Neuro CN II-XII intact and symmetric. V1-3 intact and symmetric. Symmetric facial movement. Palate elevates symmetrically. Tongue midline   Psychiatry Appropriate affect and mood for clinic visit.  Voice Falsetto, highly strained with breaks and cracks. Decreased ability to change pitch.    Laryngoscopy Flexible Diagnostic  Date/Time: 01/07/2023 12:00 PM  Performed by: Oliva Elsie Rily, MD Authorized by: Oliva Elsie Rily, MD   Comments:     PROCEDURE:  After verbal consent was obtained, the nasal cavities were sprayed with a combination of Afrin and Xylocaine . A flexible endoscope was then passed into the nasal cavities.  NASAL CAVITIES: No nasal masses or polyps  were visualized.  NASOPHARYNX: The nasopharynx was clear. Eustachian tube orifice clear and patent bilaterally.  OROPHARYNX: Tonsils were symmetric and non-obstructing. The vallecula and BOT were clear with no evidence of mass, lesion, or asymmetry.  SUPRAGLOTTIS: The epiglottis, false vocal cords, and arytenoids appeared normal.   HYPOPHARYNX: The piriform sinuses and post-cricoid regions were clear.  LARYNX/GLOTTIS: The true vocal cords were fully mobile and symmetric without nodules, ulcerations, or lesions. Severe muscle tension with all efforts at vocalization - completed compression of the arytenoids, AE folds, and false cords with complete coverage of the TVCs.  The procedure was performed without complication and was well-tolerated by the patient.    Assessment:  [name] presents today with  1. Muscle tension dysphonia    57 yo male with episodic dysphonia. Exam demonstrates severe muscle tension with vocalization. No evidence of vocal cord mass or lesion. Upper aerodigestive tract otherwise normal on exam.  Discussed findings and condition with the patient and all questions answered. Recommend evaluation and treatment with Speech Pathology - referral placed.    Plan:  As above. Follow as needed for questions or concerns.   Oliva MICAEL Rily, MD Otolaryngology - Head & Neck Surgery ENT & Audiology - [facility]   Electronically signed by: Oliva Elsie Rily, MD 01/10/2023 8:48 AM

## 2023-10-02 NOTE — Progress Notes (Signed)
 Date of visit   :  04/05/21  Patient name:  Alexander Carroll Date of birth:  1965/09/05 MRN               :  [id number]  Chief complaint; GERD  HPI: Alexander Carroll is a 57 y.o. male. The Esophageal Disease and Swallowing disorders clinic was asked to see the patient by Dr. Marlyse for evaluation of GERD.   He has had reflux symptoms for 3 years.  He says he vomits 45 minutes to 4-5 hours after he eats.   He says he is not nauseated it is regurgitation.  He says food comes up.   He does have solid and liquid dysphagia, in the chest area. He does not have chest pain.  He has not abdominal pain or bloating.  He said he has taken medicine for it but cannot remember which medication, but it started with a p.  He said it was prescribed once daily.  He said it would help sometimes.  He says he is still taking it.  He has mild burning in the chest.  He says he is overall healthy.  No prior surgeries to chest or abdomen.  He does not use tobacco.  He seldom drinks.  He does not use NSAIDS on a regular basis.  He denies any history of seasonal allergies, asthma, etc.  11/21/18--The patient was not available because he was stuck at work at [employer].  Dr. Ivonne spoke to the patient's fiance'.  She says the reflux remains a problem.  He has heartburn.  He was found to have esophagitis during last EGD.  He has a hiatal hernia.  He had LA grade D esophagitis.      FH is negative for any GI disorders.  05/20/20--He is s/p surgery with Dr. Darrelyn and 3-4 months ago he started vomiting.  He says he was doing well prior to that time.  He works for [employer] and does a lot of heavy lifting.  He clarified that he has post prandial regurgitation, not nausea/vomiting.  He says liquid material and food come up.  He has no heartburn.  He says he does not have heartburn.  He takes Protonix  40 mg twice daily.  He does not take Pepcid  he states.  He denies having melena or hematochezia.  Occasionally he has  mushy stools.  He reports no change in usual bowel habits.    He has had colonoscopy in [place].  He reports no history of colon polyps.  There is no FH of colon cancer in first degree relatives.    09/30/20 - The patient has improved since improved since he saw me after his EGD endoflip. The heartburn is ok. He has breakthrough symptoms 1 week.It is occurring at dinner. He lays on his size.at night.He states that food sticks rarely   10/04/23-  History of Present Illness The patient presents for nausea, vomiting, reflux symptoms, and voice loss.  Nausea, vomiting, and reflux symptoms have been present for the past month. A barium study indicated that the previous fundoplication has come apart. The patient is currently taking vonoprazan, which helps with heartburn, although regurgitation occurs occasionally. He reports that food does not always get stuck, but certain foods may cause this issue. He has been sleeping on the couch to achieve better elevation for his head and shoulders.  The patient lost his voice about 2 weeks ago. Initially, his voice was scratchy and foggy, then improved, but has since worsened. He  was in a rehab facility for over a month due to sepsis and has been doing better since then.  Occupation: Works at [employer]: Sleeps on the couch for better elevation  PAST SURGICAL HISTORY: Fundoplication   Previous Gastrointestinal procedures and work up: Upper GI 03/07/20 CONCLUSION:   1.  Small hiatal hernia that includes the superior aspect of the fundoplication wrap. 2.  Partial opacification of the lumen of the fundoplication which may reflect partial unwrapping. 3.  Gastroesophageal reflux. 4.  Moderate nonspecific esophageal dysmotility.  05/01/20 FINDINGS:   Solid gastric retention percentages at approximate times with normal ranges:   0 minutes: 100%   60 minutes (normal 37-90%): 41 %   120 minutes (normal 30%-60%): 28%   180 minutes (normal 10%-29%):  14%   240 minutes (normal 0%-9%): 9%   CONCLUSION:   No evidence of delayed gastric emptying with values as described above.  04/07/21 - Patient is back He is currently on acciphex. He has heartburn about 1 time for week.   Esophageal manometry Impressions There is a small hiatal hernia present and the resting pressure of the LES is slightly hypotensive. Otherwise the remainder of the function of the body of the esophagus is within normal limits. Impedance is also within normal limits. He has heartburn and then vomits.   Colonoscopy 3 years ago per patient was normal. He says he had an EGD in 2018 with savory dilation.  He says it helped temporarily.  He had a gastric emptying study.   CT abdomen pelvis 08/17/18 No acute or traumatic findings within the visualized abdomen and pelvis. Please note that the superior-most portion of the abdomen including a portion of the hepatic dome were partially excluded from the field of view.   Esophagram FINDINGS: Esophageal distention: Normal distention without mass or stricture  Gastric emptying 37.5% emptied at 1 hr ( normal >= 10%)  57.6% emptied at 2 hr ( normal >= 40%)  74.3% emptied at 3 hr ( normal >= 70%)  96.9% emptied at 4 hr ( normal >= 90%)  IMPRESSION: Normal gastric emptying study.  Esophageal manometry Cannot find result  ESOPHAGUS: The end of the gastric folds and z-line were located 36cm from incisors. The diaphragmatic impression was located 41 cm from incisors. Reflux esophagitis was found at the gastroesophageal junction. Esophagitis was LA Class C: A 45cm hiatal hernia was noted.    We passed a 17mm Savary dilator over a guidewire. Post dilation appearance was satisfactory.   STOMACH: Mild erosive gastritis in the antrum. Hill grade IV hiatus. Partial intact fundoplication appears to be intrathoracic DUODENUM: The duodenal mucosa showed no abnormalities in the duodenal bulb and 2nd part duodenum. Retroflexion  was performed in the stomach and revealed a hiatal hernia hill grade IV. The endoscope was then slowly withdrawn and removed.     Impression  5cm hiatal hernai  LA grade C erosive esophagitis Mild erosive gastritis   Recommendation F/u with Dr Ivonne in March to discuss vonoprazan ____________________ Allergies: Allergies  Allergen Reactions  . Penicillin Rash (ALLERGY/intolerance)   Reviewed by Elspeth Ivonne Medications: Current Outpatient Medications  Medication Sig  . acetaminophen  (TYLENOL ) 500 MG tablet Take 500-1,000 mg by mouth every 6 (six) hours as needed for Pain.     . famotidine  (PEPCID ) 40 MG tablet Take 1 tablet (40 mg total) by mouth 2 times daily.  SABRA lubiprostone (AMITIZA) 8 MCG capsule Take 1 capsule (8 mcg total) by mouth daily.  . naldemedine (SYMPROIC) 0.2  mg tablet Take 1 tablet (0.2 mg total) by mouth daily.  . pantoprazole  (PROTONIX ) 40 MG tablet TAKE 1 TABLET(40 MG) BY MOUTH TWICE DAILY  . RABEprazole (ACIPHEX) 20 mg DR tablet Take 1 tablet (20 mg total) by mouth 2 times daily.   Reviewed by Elspeth Ina  Past Medical History: Past Medical History:  Diagnosis Date  . GERD (gastroesophageal reflux disease)   . Thyroid disease     Reviewed  Elspeth Ina Past Surgical History: Past Surgical History:  Procedure Laterality Date  . LAPAROSCOPIC NISSEN FUNDOPLICATION N/A 01/09/2019   Procedure: NISSEN FUNDOPLICATION LAPAROSCOPIC;  Surgeon: Tinnie Comer Sing, MD;  Location: [facility] MAIN OR;  Service: General;  Laterality: N/A;  . NO PAST SURGERIES    . PARAESOPHAGEAL HERNIA REPAIR N/A 01/09/2019   Procedure: PARAESOPHAGEAL HERNIA REPAIR LAPAROSCOPIC;  Surgeon: Tinnie Comer Sing, MD;  Location[facility] MAIN OR;  Service: General;  Laterality: N/A;   Reviewed by Elspeth Ina Social History: Social History   Socioeconomic History  . Marital status: Married    Spouse name: Not on file  . Number of children: Not on file  . Years of  education: Not on file  . Highest education level: Not on file  Occupational History  . Not on file  Tobacco Use  . Smoking status: Never  . Smokeless tobacco: Never  Vaping Use  . Vaping Use: Never used  Substance and Sexual Activity  . Alcohol use: Yes    Comment: occ beer only  . Drug use: Never  . Sexual activity: Not on file  Other Topics Concern  . Not on file  Social History Narrative  . Not on file   Social Determinants of Health   Financial Resource Strain: Not on file  Food Insecurity: Not on file  Transportation Needs: Not on file  Physical Activity: Not on file  Stress: Not on file  Social Connections: Not on file  Housing Stability: Not on file   Reviewed by Elspeth Ina Family History: Family History  Problem Relation Age of Onset  . Kidney disease Mother   . Leukemia Father   . Clotting disorder Neg Hx   . Anesthesia problems Neg Hx    Reviewed by Elspeth Ina  Review of Systems (ROS)   As per HPI General (Gen): no fever, chills, weight loss, headache Eyes: no vision loss, diplopia (double vision), eye pain Ears/Nose/Throat (ENT):  No frequent or difficult to control nosebleeds (epistaxis) Cardiovascular (CV): no chest pain, tightness, or pressure in chest, arms, or neck during rest or strenuous activity, no shortness of breath (dyspnea) on exertion, elevated sleeping to breathe (orthopnea), leg swelling (edema) Respiratory (RES): no cough, shortness of breath (dyspnea), excessive sputum, coughing up blood (hemoptysis) Gastrointestinal (GI): see HPI Musculoskeletal (MS): no back pain, joint pain, joint swelling with redness or heat, transient periods of weakness in arms or legs Skin: no rash, itching (pruritus), suspicious lesion Neurologic (NEURO): no seizures, tremors [facility]): no depression, anxiety  Reviewed by   Elspeth Ina ____________________ Physical Exam:  There were no vitals taken for this visit.  General - NAD,  sitting up in bed, well groomed Eyes - No scleral icterus EMusculoskeletal - normal range of motion,  Neurological - Alert and oriented x 3  Physical exam performed by Elspeth Ina   Assessment: Mr. Dolinger is a 58 year old male s/p paraesophageal hernia repair and Nissen Fundoplication by Dr Sing in 2020 recent endoscopy revealed recurrent erosive esophagitis and recurrent 4cm hiatal hernia  Assessment & Plan 1. Nausea, vomiting, and reflux symptoms: - Symptoms have recurred within the last month. - A barium study will be ordered to evaluate the fundoplication and hiatal hernia. He is currently on vonoprazan, which helps control heartburn, but he still experiences occasional regurgitation. - An esophageal manometry will be conducted to assess the esophageal vigor and determine if he is a suitable candidate for redo surgery. A gastric emptying study will be conducted to evaluate for gastroparesis - He is advised to avoid heavy lifting at work to prevent further complications. He is advised to reschedule his appointment with Dr. Leta to allow time for these tests to be completed.  2. Voice loss: - He has been unable to speak for about a week or two, with symptoms including a scratchy and foggy voice that initially improved but then worsened. - This may be due to laryngitis, but reflux can also cause similar symptoms, although it is rare. - He is advised to consult his primary care physician regarding this issue.  Follow-up: 10/19/2023    I personally obtained the history and review of systems, performed the physical exam. I have personally spent minutes involved in face-to-face and non-face-to-face activities for this patient on the day of the visit.  Professional time spent includes the following activities, in addition to those noted in the documentation:  Time spent independently by:   Dr Elspeth Ina Resident Fellow   During my time I personally  reviewed the below test as indicated by an X  Available old records x High resolution manometry Ph impedance testing 4 hour Gastric study Recent labs on  Bravo test I personally viewed and rendered a diagnosis Prescribed a new medication and discussed risk and benefits of  new medication and potential side effects Discussed risk and benefit of the proposed endoscopic procedure Radiographic imaging x  // Given this clinic is the esophageal disease and swallowing clinic, our main focus will be their esophageal issues. We recommend that all patients have a primary gastroenterologist to follow up with for general gastrointestinal issues. If needed, we can recommend a primary gastroenterologist.    Elspeth Ina, MD, FAAFP, FACP, [facility] Associate Professor of Internal Medicine Section of Gastroenterology [facility] [address] [place], [place] [address] [phone] (p) [phone] (f)

## 2023-10-04 ENCOUNTER — Emergency Department (HOSPITAL_BASED_OUTPATIENT_CLINIC_OR_DEPARTMENT_OTHER)
Admission: EM | Admit: 2023-10-04 | Discharge: 2023-10-04 | Disposition: A | Discharge status: Home / Self Care | Attending: Emergency Medicine | Admitting: Emergency Medicine

## 2023-10-04 ENCOUNTER — Other Ambulatory Visit: Payer: Self-pay

## 2023-10-04 ENCOUNTER — Encounter (HOSPITAL_BASED_OUTPATIENT_CLINIC_OR_DEPARTMENT_OTHER): Payer: Self-pay | Admitting: *Deleted

## 2023-10-04 DIAGNOSIS — R490 Dysphonia: Secondary | ICD-10-CM | POA: Insufficient documentation

## 2023-10-04 LAB — RESP PANEL BY RT-PCR (RSV, FLU A&B, COVID)  RVPGX2
Influenza A by PCR: NEGATIVE
Influenza B by PCR: NEGATIVE
Resp Syncytial Virus by PCR: NEGATIVE
SARS Coronavirus 2 by RT PCR: NEGATIVE

## 2023-10-04 LAB — GROUP A STREP BY PCR: Group A Strep by PCR: NOT DETECTED

## 2023-10-04 NOTE — ED Provider Notes (Signed)
 [place] EMERGENCY DEPARTMENT AT [facility] Provider Note   CSN: [id number] Arrival date & time: 10/04/23  1701     Patient presents with: Laryngitis (Lost voice)   Bassem Bernasconi is a 58 y.o. male who presents to the ED today secondary to complaint of sore throat and dysphonia.  This has been a chronic problem for the patient however this current episode is about 3 weeks in duration.  Family in the room states that is hard for him to speak and swallow, patient also endorses approximately 20 pound weight loss over the last month.  Review of previous medical records shows that he had a Nissen fundoplication in 2020, has a history of erosive esophagitis with recurrent 4 cm hiatal hernia, he has also followed with ENT who has done flexible laryngoscopy with no remarkable findings back in December 2024.  He denies any cough, congestion, endorses sore throat and difficulty swallowing as long as well as difficulty speaking.   HPI     Prior to Admission medications   Medication Sig Start Date End Date Taking? Authorizing Provider  acetaminophen  (TYLENOL ) 500 MG tablet Take 1,000 mg by mouth as needed for mild pain, moderate pain or headache.    [provider]  Calcium  Carbonate Antacid (TUMS PO) Take 1-2 tablets by mouth as needed (heartburn).    [provider]  zonisamide  (ZONEGRAN ) 50 MG capsule Take 1 capsule (50 mg total) by mouth daily. 07/26/22   Bryn Bernardino NOVAK, MD    Allergies: Penicillins    Review of Systems  HENT:  Positive for sore throat, trouble swallowing and voice change.   All other systems reviewed and are negative.   Updated Vital Signs BP (!) 137/94   Pulse 73   Temp 98.2 F (36.8 C) (Oral)   Resp 18   Ht 6' 1 (1.854 m)   Wt 116.6 kg   SpO2 96%   BMI 33.91 kg/m   Physical Exam Vitals and nursing note reviewed.  Constitutional:      General: He is not in acute distress.    Appearance: He is well-developed.  HENT:     Head:  Normocephalic and atraumatic.     Mouth/Throat:     Lips: Pink.     Mouth: Mucous membranes are dry.     Pharynx: Oropharynx is clear. Uvula midline.     Tonsils: No tonsillar exudate or tonsillar abscesses.     Comments: Patient is edentulous. Eyes:     Conjunctiva/sclera: Conjunctivae normal.  Cardiovascular:     Rate and Rhythm: Normal rate and regular rhythm.     Heart sounds: No murmur heard. Pulmonary:     Effort: Pulmonary effort is normal. No respiratory distress.     Breath sounds: Normal breath sounds.  Abdominal:     Palpations: Abdomen is soft.     Tenderness: There is no abdominal tenderness.  Musculoskeletal:        General: No swelling.     Cervical back: Neck supple.  Skin:    General: Skin is warm and dry.     Capillary Refill: Capillary refill takes less than 2 seconds.  Neurological:     Mental Status: He is alert.  Psychiatric:        Mood and Affect: Mood normal.     (all labs ordered are listed, but only abnormal results are displayed) Labs Reviewed  RESP PANEL BY RT-PCR (RSV, FLU A&B, COVID)  RVPGX2  GROUP A STREP BY PCR  EKG: None  Radiology: No results found.   Procedures   Medications Ordered in the ED - No data to display                                  Medical Decision Making  I self complaint and physical exam, obtained strep and respiratory panel swabs to assess for strep and RSV/COVID/flu respectively.  These were negative.  As a result, given his longstanding history of erosive esophagitis, GERD which is not responsive to maximal therapy, believe this is secondary to likely irritation secondary to GERD as well as previously noted muscle tension laryngitis.  As such, will have patient follow-up with GI for continued evaluation and management of the same.  As explained to the patient, they understand agree have no further concerns at this time.     Final diagnoses:  Dysphonia    ED Discharge Orders     None           Myriam Dorn BROCKS, [place] 10/04/23 2047    Ruthe Cornet, DO 10/04/23 2104

## 2023-10-04 NOTE — ED Triage Notes (Signed)
 BIB family from home for lost voice. PT writing on paper, nodding or shaking head. Wife answering some questions.  Endorses sore throat, lost voice, difficulty swallowing, hurts to swallow, and acid reflux. Reports h/o hiatal hernia. Pt of GI Dr. Ivonne. Mentions esophagus is coming unwrapped and may need a procedure. Alert, NAD, calm, interactive. Denies coughing, bleeding, sob, NV, confusion, weakness, or fever. Steady gait.

## 2023-10-04 NOTE — ED Notes (Signed)
 Reviewed AVS/discharge instructions with patient. Time allotted for and all questions answered. Patient is agreeable for d/c and escorted to ED exit by staff.

## 2024-01-23 ENCOUNTER — Emergency Department (HOSPITAL_BASED_OUTPATIENT_CLINIC_OR_DEPARTMENT_OTHER): Admission: EM | Admit: 2024-01-23 | Discharge: 2024-01-23 | Disposition: A | Discharge status: Home / Self Care

## 2024-01-23 ENCOUNTER — Encounter (HOSPITAL_BASED_OUTPATIENT_CLINIC_OR_DEPARTMENT_OTHER): Payer: Self-pay

## 2024-01-23 ENCOUNTER — Other Ambulatory Visit: Payer: Self-pay

## 2024-01-23 DIAGNOSIS — R059 Cough, unspecified: Secondary | ICD-10-CM | POA: Diagnosis present

## 2024-01-23 DIAGNOSIS — J040 Acute laryngitis: Secondary | ICD-10-CM | POA: Insufficient documentation

## 2024-01-23 LAB — RESP PANEL BY RT-PCR (RSV, FLU A&B, COVID)  RVPGX2
Influenza A by PCR: NEGATIVE
Influenza B by PCR: NEGATIVE
Resp Syncytial Virus by PCR: NEGATIVE
SARS Coronavirus 2 by RT PCR: NEGATIVE

## 2024-01-23 MED ORDER — DEXAMETHASONE SOD PHOSPHATE PF 10 MG/ML IJ SOLN
INTRAMUSCULAR | Status: AC
  Filled 2024-01-23: qty 1

## 2024-01-23 MED ORDER — DEXAMETHASONE 1 MG/ML PO CONC: 10.0000 mg | Freq: Once | ORAL | Status: DC

## 2024-01-23 MED ORDER — DEXAMETHASONE 10 MG/ML FOR PEDIATRIC ORAL USE
10.0000 mg | Freq: Once | INTRAMUSCULAR | Status: AC
  Administered 2024-01-23: 10 mg via ORAL

## 2024-01-23 NOTE — ED Notes (Signed)

## 2024-01-23 NOTE — Discharge Instructions (Signed)
 You can use any over-the-counter medications as needed for your symptoms.  You should start taking an allergy medicine daily like Allegra, Claritin , or Zyrtec.

## 2024-01-23 NOTE — ED Triage Notes (Signed)
 Cough x2-3 days. Loss of voice on Friday. Denies body aches, chills or fever. Intermittent mild headache. PMH GERD as well.

## 2024-01-23 NOTE — ED Provider Notes (Signed)
 "  EMERGENCY DEPARTMENT AT [facility] Provider Note   CSN: [id number] Arrival date & time: 01/23/24  1224     Patient presents with: Cough   Alexander Carroll is a 59 y.o. male.   59 year old male presents for evaluation of cough and states he lost his voice.  Wife helps write this.  Patient's reports.  States he has had cold-like symptoms for the last few days.  States he lost his voice a few days ago, it came back and then got worse again.  Denies any other symptoms or concerns.   Cough Associated symptoms: no chest pain, no chills, no ear pain, no fever, no rash, no shortness of breath and no sore throat        Prior to Admission medications  Medication Sig Start Date End Date Taking? Authorizing Provider  acetaminophen  (TYLENOL ) 500 MG tablet Take 1,000 mg by mouth as needed for mild pain, moderate pain or headache.    [provider]  Calcium  Carbonate Antacid (TUMS PO) Take 1-2 tablets by mouth as needed (heartburn).    [provider]  zonisamide  (ZONEGRAN ) 50 MG capsule Take 1 capsule (50 mg total) by mouth daily. 07/26/22   Bryn Bernardino NOVAK, MD    Allergies: Penicillins    Review of Systems  Constitutional:  Negative for chills and fever.  HENT:  Negative for ear pain and sore throat.   Eyes:  Negative for pain and visual disturbance.  Respiratory:  Positive for cough. Negative for shortness of breath.   Cardiovascular:  Negative for chest pain and palpitations.  Gastrointestinal:  Negative for abdominal pain and vomiting.  Genitourinary:  Negative for dysuria and hematuria.  Musculoskeletal:  Negative for arthralgias and back pain.  Skin:  Negative for color change and rash.  Neurological:  Negative for seizures and syncope.  All other systems reviewed and are negative.   Updated Vital Signs BP (!) 160/97 (BP Location: Right Arm)   Pulse 98   Temp 98 F (36.7 C) (Oral)   Resp 17   SpO2 98%   Physical Exam Vitals and nursing  note reviewed.  Constitutional:      General: He is not in acute distress.    Appearance: Normal appearance. He is well-developed. He is not ill-appearing.  HENT:     Head: Normocephalic and atraumatic.     Mouth/Throat:     Mouth: Mucous membranes are moist.     Pharynx: Oropharynx is clear. Posterior oropharyngeal erythema present.  Eyes:     Conjunctiva/sclera: Conjunctivae normal.  Cardiovascular:     Rate and Rhythm: Normal rate and regular rhythm.     Heart sounds: No murmur heard. Pulmonary:     Effort: Pulmonary effort is normal. No respiratory distress.     Breath sounds: Normal breath sounds.  Abdominal:     Palpations: Abdomen is soft.     Tenderness: There is no abdominal tenderness.  Musculoskeletal:        General: No swelling.     Cervical back: Neck supple.  Skin:    General: Skin is warm and dry.     Capillary Refill: Capillary refill takes less than 2 seconds.  Neurological:     General: No focal deficit present.     Mental Status: He is alert.  Psychiatric:        Mood and Affect: Mood normal.     (all labs ordered are listed, but only abnormal results are displayed) Labs Reviewed  RESP PANEL  BY RT-PCR (RSV, FLU A&B, COVID)  RVPGX2    EKG: None  Radiology: No results found.   Procedures   Medications Ordered in the ED  dexamethasone  (DECADRON ) 10 MG/ML injection for Pediatric ORAL use 10 mg (has no administration in time range)                                    Medical Decision Making Patient here for laryngitis.  Denies any other symptoms or concerns and otherwise appears well.  Will give him steroids here and advised allergy medication and other over-the-counter medications as needed.  He feels comfortable being discharged home.  Advise follow-up with primary care and otherwise return to the ER for new or worsening symptoms.  Problems Addressed: Laryngitis: acute illness or injury  Amount and/or Complexity of Data Reviewed External  Data Reviewed: notes.    Details: Prior ED records reviewed and patient seen for dysphonia on 09/2023  Risk OTC drugs. Prescription drug management.     Final diagnoses:  Laryngitis    ED Discharge Orders     None          Gennaro Duwaine CROME, DO 01/23/24 1426  "

## 2024-02-07 ENCOUNTER — Other Ambulatory Visit: Payer: Self-pay

## 2024-02-07 ENCOUNTER — Emergency Department (HOSPITAL_COMMUNITY)

## 2024-02-07 ENCOUNTER — Encounter (HOSPITAL_COMMUNITY): Payer: Self-pay | Admitting: *Deleted

## 2024-02-07 ENCOUNTER — Emergency Department (HOSPITAL_COMMUNITY)
Admission: EM | Admit: 2024-02-07 | Discharge: 2024-02-07 | Disposition: A | Discharge status: Home / Self Care | Attending: Emergency Medicine | Admitting: Emergency Medicine

## 2024-02-07 DIAGNOSIS — S20212A Contusion of left front wall of thorax, initial encounter: Secondary | ICD-10-CM | POA: Diagnosis not present

## 2024-02-07 DIAGNOSIS — R0789 Other chest pain: Secondary | ICD-10-CM | POA: Diagnosis present

## 2024-02-07 DIAGNOSIS — Y92410 Unspecified street and highway as the place of occurrence of the external cause: Secondary | ICD-10-CM | POA: Diagnosis not present

## 2024-02-07 LAB — CBC
HCT: 45.4 % (ref 39.0–52.0)
Hemoglobin: 15.9 g/dL (ref 13.0–17.0)
MCH: 30.1 pg (ref 26.0–34.0)
MCHC: 35 g/dL (ref 30.0–36.0)
MCV: 86 fL (ref 80.0–100.0)
Platelets: 195 10*3/uL (ref 150–400)
RBC: 5.28 MIL/uL (ref 4.22–5.81)
RDW: 12.9 % (ref 11.5–15.5)
WBC: 7 10*3/uL (ref 4.0–10.5)
nRBC: 0 % (ref 0.0–0.2)

## 2024-02-07 LAB — COMPREHENSIVE METABOLIC PANEL WITH GFR
ALT: 35 U/L (ref 0–44)
AST: 28 U/L (ref 15–41)
Albumin: 4.4 g/dL (ref 3.5–5.0)
Alkaline Phosphatase: 82 U/L (ref 38–126)
Anion gap: 14 (ref 5–15)
BUN: 22 mg/dL — ABNORMAL HIGH (ref 6–20)
CO2: 22 mmol/L (ref 22–32)
Calcium: 9.2 mg/dL (ref 8.9–10.3)
Chloride: 105 mmol/L (ref 98–111)
Creatinine, Ser: 0.78 mg/dL (ref 0.61–1.24)
GFR, Estimated: >60 mL/min
Glucose, Bld: 117 mg/dL — ABNORMAL HIGH (ref 70–99)
Potassium: 4.2 mmol/L (ref 3.5–5.1)
Sodium: 142 mmol/L (ref 135–145)
Total Bilirubin: 0.6 mg/dL (ref 0.0–1.2)
Total Protein: 7 g/dL (ref 6.5–8.1)

## 2024-02-07 LAB — TYPE AND SCREEN
ABO/RH(D): B POS
Antibody Screen: NEGATIVE

## 2024-02-07 MED ORDER — IBUPROFEN 600 MG PO TABS: 600.0000 mg | ORAL_TABLET | Freq: Four times a day (QID) | ORAL | 0 refills | Status: DC | PRN

## 2024-02-07 MED ORDER — MORPHINE SULFATE (PF) 4 MG/ML IV SOLN
4.0000 mg | Freq: Once | INTRAVENOUS | Status: AC
  Administered 2024-02-07: 4 mg via INTRAVENOUS
  Filled 2024-02-07: qty 1

## 2024-02-07 MED ORDER — ONDANSETRON HCL 4 MG/2ML IJ SOLN
4.0000 mg | Freq: Once | INTRAMUSCULAR | Status: AC
  Administered 2024-02-07: 4 mg via INTRAVENOUS
  Filled 2024-02-07: qty 2

## 2024-02-07 MED ORDER — HYDROCODONE-ACETAMINOPHEN 5-325 MG PO TABS: 1.0000 | ORAL_TABLET | Freq: Four times a day (QID) | ORAL | 0 refills | Status: DC | PRN

## 2024-02-07 MED ORDER — IOHEXOL 300 MG/ML  SOLN
100.0000 mL | Freq: Once | INTRAMUSCULAR | Status: AC | PRN
  Administered 2024-02-07: 100 mL via INTRAVENOUS
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
## 2024-02-07 NOTE — ED Notes (Signed)
 Nurse assessment: Pt reports right rib pain. Pt states it feel like when he broke his ribs previously.

## 2024-02-07 NOTE — ED Notes (Signed)
 Birdena Clarity, PA-C bedside speaking with patient
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
## 2024-02-07 NOTE — ED Notes (Signed)
"  Pt in imaging  "

## 2024-02-08 ENCOUNTER — Telehealth (HOSPITAL_COMMUNITY): Payer: Self-pay | Admitting: Emergency Medicine

## 2024-02-08 MED ORDER — IBUPROFEN 600 MG PO TABS: 600.0000 mg | ORAL_TABLET | Freq: Three times a day (TID) | ORAL | 0 refills | Status: DC

## 2024-02-08 MED ORDER — IBUPROFEN 600 MG PO TABS: 600.0000 mg | ORAL_TABLET | Freq: Three times a day (TID) | ORAL | 0 refills | Status: AC

## 2024-02-08 MED ORDER — HYDROCODONE-ACETAMINOPHEN 5-325 MG PO TABS: 1.0000 | ORAL_TABLET | Freq: Four times a day (QID) | ORAL | 0 refills | Status: DC | PRN

## 2024-02-08 MED ORDER — HYDROCODONE-ACETAMINOPHEN 5-325 MG PO TABS: 1.0000 | ORAL_TABLET | Freq: Four times a day (QID) | ORAL | 0 refills | Status: AC | PRN
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
